# Patient Record
Sex: Female | Born: 1945 | Race: White | Hispanic: No | Marital: Married | Smoking: Never smoker
Health system: Southern US, Community
[De-identification: ages and names within clinical notes are randomized; demographics above are authoritative.]

## PROBLEM LIST (undated history)

## (undated) DIAGNOSIS — K5790 Diverticulosis of intestine, part unspecified, without perforation or abscess without bleeding: Secondary | ICD-10-CM

## (undated) DIAGNOSIS — K589 Irritable bowel syndrome without diarrhea: Secondary | ICD-10-CM

## (undated) DIAGNOSIS — H269 Unspecified cataract: Secondary | ICD-10-CM

## (undated) DIAGNOSIS — N8110 Cystocele, unspecified: Secondary | ICD-10-CM

## (undated) DIAGNOSIS — Z860100 Personal history of colon polyps, unspecified: Secondary | ICD-10-CM

## (undated) DIAGNOSIS — K635 Polyp of colon: Secondary | ICD-10-CM

## (undated) DIAGNOSIS — E039 Hypothyroidism, unspecified: Secondary | ICD-10-CM

## (undated) DIAGNOSIS — K449 Diaphragmatic hernia without obstruction or gangrene: Secondary | ICD-10-CM

## (undated) DIAGNOSIS — K219 Gastro-esophageal reflux disease without esophagitis: Secondary | ICD-10-CM

## (undated) DIAGNOSIS — Z853 Personal history of malignant neoplasm of breast: Secondary | ICD-10-CM

## (undated) DIAGNOSIS — R002 Palpitations: Secondary | ICD-10-CM

## (undated) DIAGNOSIS — E785 Hyperlipidemia, unspecified: Secondary | ICD-10-CM

## (undated) DIAGNOSIS — R7303 Prediabetes: Secondary | ICD-10-CM

## (undated) DIAGNOSIS — M810 Age-related osteoporosis without current pathological fracture: Secondary | ICD-10-CM

## (undated) DIAGNOSIS — R55 Syncope and collapse: Secondary | ICD-10-CM

## (undated) HISTORY — PX: NASAL SINUS SURGERY: SHX719

## (undated) HISTORY — DX: Personal history of colonic polyps: Z86.010

## (undated) HISTORY — PX: BUNIONECTOMY: SHX129

## (undated) HISTORY — PX: CARPAL TUNNEL RELEASE: SHX101

## (undated) HISTORY — DX: Irritable bowel syndrome, unspecified: K58.9

## (undated) HISTORY — DX: Personal history of malignant neoplasm of breast: Z85.3

## (undated) HISTORY — DX: Unspecified cataract: H26.9

## (undated) HISTORY — DX: Hypothyroidism, unspecified: E03.9

## (undated) HISTORY — DX: Prediabetes: R73.03

## (undated) HISTORY — DX: Diaphragmatic hernia without obstruction or gangrene: K44.9

## (undated) HISTORY — PX: TONSILLECTOMY: SUR1361

## (undated) HISTORY — DX: Hyperlipidemia, unspecified: E78.5

## (undated) HISTORY — DX: Age-related osteoporosis without current pathological fracture: M81.0

## (undated) HISTORY — DX: Gastro-esophageal reflux disease without esophagitis: K21.9

## (undated) HISTORY — DX: Polyp of colon: K63.5

## (undated) HISTORY — DX: Cystocele, unspecified: N81.10

## (undated) HISTORY — DX: Personal history of colon polyps, unspecified: Z86.0100

## (undated) HISTORY — DX: Diverticulosis of intestine, part unspecified, without perforation or abscess without bleeding: K57.90

## (undated) HISTORY — DX: Palpitations: R00.2

---
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
## 1997-11-13 ENCOUNTER — Ambulatory Visit (HOSPITAL_COMMUNITY): Admission: RE | Admit: 1997-11-13 | Discharge: 1997-11-13 | Payer: Self-pay | Admitting: Orthopedic Surgery

## 1997-11-27 ENCOUNTER — Ambulatory Visit (HOSPITAL_COMMUNITY): Admission: RE | Admit: 1997-11-27 | Discharge: 1997-11-27 | Payer: Self-pay | Admitting: Orthopedic Surgery

## 1997-11-27 ENCOUNTER — Encounter: Payer: Self-pay | Admitting: Orthopedic Surgery

## 1997-12-11 ENCOUNTER — Encounter: Payer: Self-pay | Admitting: Orthopedic Surgery

## 1997-12-11 ENCOUNTER — Ambulatory Visit (HOSPITAL_COMMUNITY): Admission: RE | Admit: 1997-12-11 | Discharge: 1997-12-11 | Payer: Self-pay | Admitting: Orthopedic Surgery

## 1998-03-28 ENCOUNTER — Other Ambulatory Visit: Admission: RE | Admit: 1998-03-28 | Discharge: 1998-03-28 | Payer: Self-pay | Admitting: Obstetrics and Gynecology

## 1998-06-25 ENCOUNTER — Other Ambulatory Visit: Admission: RE | Admit: 1998-06-25 | Discharge: 1998-06-25 | Payer: Self-pay | Admitting: Internal Medicine

## 1998-06-25 ENCOUNTER — Encounter: Payer: Self-pay | Admitting: Internal Medicine

## 1998-06-30 ENCOUNTER — Encounter: Payer: Self-pay | Admitting: Internal Medicine

## 1998-06-30 ENCOUNTER — Ambulatory Visit (HOSPITAL_COMMUNITY): Admission: RE | Admit: 1998-06-30 | Discharge: 1998-06-30 | Payer: Self-pay | Admitting: Internal Medicine

## 1999-07-01 ENCOUNTER — Other Ambulatory Visit: Admission: RE | Admit: 1999-07-01 | Discharge: 1999-07-01 | Payer: Self-pay | Admitting: Obstetrics and Gynecology

## 2001-07-03 ENCOUNTER — Other Ambulatory Visit: Admission: RE | Admit: 2001-07-03 | Discharge: 2001-07-03 | Payer: Self-pay | Admitting: Obstetrics and Gynecology

## 2002-07-19 ENCOUNTER — Encounter: Payer: Self-pay | Admitting: Internal Medicine

## 2002-09-27 ENCOUNTER — Other Ambulatory Visit: Admission: RE | Admit: 2002-09-27 | Discharge: 2002-09-27 | Payer: Self-pay | Admitting: Obstetrics and Gynecology

## 2002-10-04 ENCOUNTER — Encounter: Payer: Self-pay | Admitting: Obstetrics and Gynecology

## 2002-10-04 ENCOUNTER — Ambulatory Visit (HOSPITAL_COMMUNITY): Admission: RE | Admit: 2002-10-04 | Discharge: 2002-10-04 | Payer: Self-pay | Admitting: Obstetrics and Gynecology

## 2002-11-01 ENCOUNTER — Ambulatory Visit (HOSPITAL_COMMUNITY): Admission: RE | Admit: 2002-11-01 | Discharge: 2002-11-01 | Payer: Self-pay | Admitting: Internal Medicine

## 2002-11-01 ENCOUNTER — Encounter: Payer: Self-pay | Admitting: Internal Medicine

## 2003-03-31 ENCOUNTER — Emergency Department (HOSPITAL_COMMUNITY): Admission: EM | Admit: 2003-03-31 | Discharge: 2003-03-31 | Payer: Self-pay | Admitting: Emergency Medicine

## 2003-07-06 ENCOUNTER — Ambulatory Visit (HOSPITAL_COMMUNITY): Admission: RE | Admit: 2003-07-06 | Discharge: 2003-07-06 | Payer: Self-pay | Admitting: Internal Medicine

## 2003-11-12 ENCOUNTER — Other Ambulatory Visit: Admission: RE | Admit: 2003-11-12 | Discharge: 2003-11-12 | Payer: Self-pay | Admitting: Obstetrics and Gynecology

## 2004-06-24 ENCOUNTER — Ambulatory Visit: Payer: Self-pay | Admitting: Internal Medicine

## 2004-07-22 ENCOUNTER — Ambulatory Visit: Payer: Self-pay | Admitting: Gastroenterology

## 2004-07-23 ENCOUNTER — Ambulatory Visit: Payer: Self-pay | Admitting: Internal Medicine

## 2004-08-20 ENCOUNTER — Encounter: Admission: RE | Admit: 2004-08-20 | Discharge: 2004-08-20 | Payer: Self-pay | Admitting: Orthopedic Surgery

## 2004-08-28 ENCOUNTER — Ambulatory Visit (HOSPITAL_COMMUNITY): Admission: RE | Admit: 2004-08-28 | Discharge: 2004-08-28 | Payer: Self-pay | Admitting: Obstetrics and Gynecology

## 2004-10-21 ENCOUNTER — Ambulatory Visit: Payer: Self-pay | Admitting: Internal Medicine

## 2004-10-23 ENCOUNTER — Ambulatory Visit: Payer: Self-pay | Admitting: Internal Medicine

## 2004-12-11 ENCOUNTER — Ambulatory Visit: Payer: Self-pay

## 2005-02-15 ENCOUNTER — Ambulatory Visit: Payer: Self-pay | Admitting: Internal Medicine

## 2005-05-04 ENCOUNTER — Other Ambulatory Visit: Admission: RE | Admit: 2005-05-04 | Discharge: 2005-05-04 | Payer: Self-pay | Admitting: Obstetrics and Gynecology

## 2005-07-20 ENCOUNTER — Ambulatory Visit (HOSPITAL_COMMUNITY): Admission: RE | Admit: 2005-07-20 | Discharge: 2005-07-20 | Payer: Self-pay | Admitting: Internal Medicine

## 2006-10-24 ENCOUNTER — Ambulatory Visit: Payer: Self-pay | Admitting: Internal Medicine

## 2006-11-14 ENCOUNTER — Ambulatory Visit: Payer: Self-pay | Admitting: Internal Medicine

## 2006-11-14 LAB — CONVERTED CEMR LAB
ALT: 34 units/L (ref 0–35)
Albumin: 3.9 g/dL (ref 3.5–5.2)
Alkaline Phosphatase: 107 units/L (ref 39–117)
Basophils Absolute: 0 10*3/uL (ref 0.0–0.1)
Basophils Relative: 0.1 % (ref 0.0–1.0)
Bilirubin Urine: NEGATIVE
Bilirubin, Direct: 0.1 mg/dL (ref 0.0–0.3)
CRP, High Sensitivity: <1 — ABNORMAL LOW (ref 0.00–5.00)
Creatinine, Ser: 0.6 mg/dL (ref 0.4–1.2)
Crystals: NEGATIVE
Eosinophils Absolute: 0.1 10*3/uL (ref 0.0–0.6)
GFR calc Af Amer: 132 mL/min
Leukocytes, UA: NEGATIVE
Lipase: 21 units/L (ref 11.0–59.0)
Lymphocytes Relative: 19.1 % (ref 12.0–46.0)
MCHC: 34.2 g/dL (ref 30.0–36.0)
MCV: 88.5 fL (ref 78.0–100.0)
Monocytes Relative: 8.4 % (ref 3.0–11.0)
Mucus, UA: NEGATIVE
RDW: 12.8 % (ref 11.5–14.6)
Total Protein, Urine: NEGATIVE mg/dL
Total Protein: 6.8 g/dL (ref 6.0–8.3)
Urine Glucose: NEGATIVE mg/dL
pH: 6.5 (ref 5.0–8.0)

## 2006-11-16 ENCOUNTER — Ambulatory Visit (HOSPITAL_COMMUNITY): Admission: RE | Admit: 2006-11-16 | Discharge: 2006-11-16 | Payer: Self-pay | Admitting: Internal Medicine

## 2007-12-01 ENCOUNTER — Ambulatory Visit: Payer: Self-pay | Admitting: Internal Medicine

## 2007-12-14 ENCOUNTER — Ambulatory Visit: Payer: Self-pay | Admitting: Internal Medicine

## 2007-12-14 ENCOUNTER — Encounter: Payer: Self-pay | Admitting: Internal Medicine

## 2007-12-15 ENCOUNTER — Encounter: Payer: Self-pay | Admitting: Internal Medicine

## 2008-05-07 ENCOUNTER — Encounter: Payer: Self-pay | Admitting: Cardiology

## 2008-05-07 ENCOUNTER — Ambulatory Visit: Payer: Self-pay | Admitting: Cardiology

## 2008-05-07 DIAGNOSIS — E785 Hyperlipidemia, unspecified: Secondary | ICD-10-CM | POA: Insufficient documentation

## 2008-05-07 LAB — CONVERTED CEMR LAB
Direct LDL: 194.9 mg/dL
HDL: 58.7 mg/dL (ref >39.0)
VLDL: 16 mg/dL (ref 0–40)

## 2008-05-15 ENCOUNTER — Ambulatory Visit: Payer: Self-pay

## 2008-05-15 ENCOUNTER — Encounter: Payer: Self-pay | Admitting: Cardiology

## 2009-03-27 ENCOUNTER — Telehealth: Payer: Self-pay | Admitting: Internal Medicine

## 2009-03-31 ENCOUNTER — Ambulatory Visit: Payer: Self-pay | Admitting: Gastroenterology

## 2009-03-31 DIAGNOSIS — E039 Hypothyroidism, unspecified: Secondary | ICD-10-CM | POA: Insufficient documentation

## 2009-03-31 DIAGNOSIS — Z86010 Personal history of colonic polyps: Secondary | ICD-10-CM | POA: Insufficient documentation

## 2009-03-31 DIAGNOSIS — Z853 Personal history of malignant neoplasm of breast: Secondary | ICD-10-CM | POA: Insufficient documentation

## 2009-03-31 DIAGNOSIS — K5730 Diverticulosis of large intestine without perforation or abscess without bleeding: Secondary | ICD-10-CM | POA: Insufficient documentation

## 2009-04-01 LAB — CONVERTED CEMR LAB
Eosinophils Absolute: 0.1 10*3/uL (ref 0.0–0.7)
Eosinophils Relative: 1 % (ref 0.0–5.0)
HCT: 39.8 % (ref 36.0–46.0)
Lymphocytes Relative: 23.9 % (ref 12.0–46.0)
Monocytes Absolute: 0.9 10*3/uL (ref 0.1–1.0)
Monocytes Relative: 9.5 % (ref 3.0–12.0)
Neutrophils Relative %: 64.6 % (ref 43.0–77.0)
Platelets: 397 10*3/uL (ref 150.0–400.0)
RBC: 4.37 M/uL (ref 3.87–5.11)
RDW: 12.7 % (ref 11.5–14.6)
WBC: 9 10*3/uL (ref 4.5–10.5)

## 2010-04-21 NOTE — Assessment & Plan Note (Signed)
Summary: Blood on stool/rectal pain   History of Present Illness Visit Type: follow up  Primary GI MD: Yancey Flemings MD Primary Provider: Dr. Simone Curia Requesting Provider: n/a Chief Complaint: BRB in stool and rectal pain  History of Present Illness:   65  YO FEMALE  KNOWN TO DR Marina Goodell WITH HX OF DIVERTICULOSIS AND ADENOMATOUS POLYPS. SHE HAD AN EGD IN 2006-NORMAL. LAST COLONOSCOPY 11/2007 WITH SEVERAL SMALL POLYPS/ADENOMATOUS AND INTERNAL HEMORRHOIDS. SHE COMES IN TODAY WITH C/O INTERMITTENT RECTAL BLEEDING WITH SMALL AMTS OF BRB WITH  BM OVER THE PAST 3 MONTHS. SHE HAS HAD SOME INTERNAL RECTAL DISCOMFORT ,MORE LEFT SIDED. SHE SAYS SHE STANDS OR WALKS AT WORK FOR ABOUT 10 HOURS/DAY, AND GETS A PULLING SENSATION IN BLADDER/UTERUS AREA. SHE HAS ALSO NOTED MORE DIFFICULTY EVACUATING BOWELS RECENTLY. SHE WAS SEEN BY UROLOGY AND PER PT NOT FELT TO HAVE ANY SIGNIFICANT BLADDER PROLAPSE.   GI Review of Systems      Denies abdominal pain, acid reflux, belching, bloating, chest pain, dysphagia with liquids, dysphagia with solids, heartburn, loss of appetite, nausea, vomiting, and  vomiting blood.      Reports diverticulosis, rectal bleeding, and  rectal pain.     Denies anal fissure, black tarry stools, change in bowel habit, fecal incontinence, heme positive stool, hemorrhoids, irritable bowel syndrome, jaundice, light color stool, and  liver problems.   Current Medications (verified): 1)  Synthroid 75 Mcg Tabs (Levothyroxine Sodium) .... Take One Tab Daily 2)  Vitamin D 1000 Unit Tabs (Cholecalciferol) .... As Directed 3)  Multiple Vitamins  Tabs (Multiple Vitamin) .... Take Tab As Diredted 4)  Nasonex 50 Mcg/act Susp (Mometasone Furoate) .... As Directed 5)  Afrin Nasal Spray 0.05 % Soln (Oxymetazoline Hcl) .... As Directed 6)  Fish Oil 1000 Mg Caps (Omega-3 Fatty Acids) .... As Directed 7)  Calcium 600 1500 Mg Tabs (Calcium Carbonate) .... One Tablet By Mouth Once Daily  Allergies  (verified): 1)  * Shellfish  Past History:  Past Medical History: 1.  Hyperlipidemia:  she is now taking fish oil.  She had myalgias with Vytorin and Lipitor.  2.  Hypothyroidism. 3.  Diverticulosis.  4.  Anxiety.  5.  IBS 6.  Colonic polyps./ADENOMATOUS-LAST COLON 11/2007  7.  GERD  Past Surgical History: Reviewed history from 05/07/2008 and no changes required.   Diagnostic laparoscopy. 08/28/2004  Family History: Father with h/o 2 heart valve replacements.  He also had a stroke.  Brother with MI at age 60.   Family History of Colon Cancer:Great Uncle on Paternal Side   Social History: Reviewed history from 05/07/2008 and no changes required. Lives with husband in [place].  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  No ETOH.    Review of Systems  The patient denies allergy/sinus, anemia, anxiety-new, arthritis/joint pain, back pain, blood in urine, breast changes/lumps, change in vision, confusion, cough, coughing up blood, depression-new, fainting, fatigue, fever, headaches-new, hearing problems, heart murmur, heart rhythm changes, itching, menstrual pain, muscle pains/cramps, night sweats, nosebleeds, pregnancy symptoms, shortness of breath, skin rash, sleeping problems, sore throat, swelling of feet/legs, swollen lymph glands, thirst - excessive , urination - excessive , urination changes/pain, urine leakage, vision changes, and voice change.    Vital Signs:  Patient profile:   65 year old female Height:      61 inches Weight:      119 pounds BMI:     22.57 BSA:     1.52 Pulse rate:   72 / minute  Pulse rhythm:   regular BP sitting:   118 / 64  (left arm) Cuff size:   regular  Vitals Entered By: Ok Anis CMA (March 31, 2009 1:30 PM)  Physical Exam  General:  Well developed, well nourished, no acute distress. Head:  Normocephalic and atraumatic. Eyes:  PERRLA, no icterus. Lungs:  Clear throughout to auscultation. Heart:  Regular rate and rhythm; no murmurs,  rubs,  or bruits. Abdomen:  SOFT, NONTENDER, NO MASS OR HSM,BS+ Rectal:  NO EXTERNAL LESION,NO INTERNAL LESION APPRECIATED,NONTENDER EXAM,NO STOOL IN VAULT-MUCUS HEME NEGATIVE.THERE IS FULLNESS PROLAPSE OF VAGINA IN TO RECTUM WITH VALSALVA Extremities:  No clubbing, cyanosis, edema or deformities noted. Neurologic:  Alert and  oriented x4;  grossly normal neurologically. Psych:  Alert and cooperative. Normal mood and affect.   Impression & Recommendations:  Problem # 1:  RECTAL BLEEDING (ICD-569.3) Assessment New 65 YO FEMALE WITH INTERMITTENT SMALL VOLUME HEMATOCHEZIA,LIKELY SECONDARY TO INTERNAL HEMORRHOIDS. SHE ALSO HAS SXS OF PELVIC FLOOR DYSFUNCTION WITH SENSATION OF INCOMPLETE BOWEL EVACUATION,AND PRESSURE /PULLING WITH PROLONGED STANDING. CANNOT R/O RECTOCELE. START BENEFIBER DAILY ANALPRAM CREAM 2.5% 2-3 X DAILY X 2 WEEKS THEN AS NEEDED PT WILL MAKE A FOLLOW UP APPT WITH HER GYNECOLOGIST FOR FURTHER EVALUATION FOLLOW UP WITH DR Marina Goodell IN 3 WEEKS IF SXS PERSIST(RECTAL  BLEEDING). Orders: TLB-CBC Platelet - w/Differential ([id number])  Problem # 2:  PERSONAL HX COLONIC POLYPS (ICD-V12.72) Assessment: Comment Only ADENOMATOUS-LAST COLON 9/09 ;DUE FOR FOLLOW UP 1014  Problem # 3:  HYPOTHYROIDISM (ICD-244.9) Assessment: Comment Only  Problem # 4:  HYPERLIPIDEMIA (ICD-272.4) Assessment: Comment Only  Patient Instructions: 1)  Please go to the lab, basement level. 2)  Use Benefiber, daily. 3)  We have given you a crad with appt to see Dr. Marina Goodell 04-14-09.  4)  Copy sent to : Dr. Simone Curia 5)  The medication list was reviewed and reconciled.  All changed / newly prescribed medications were explained.  A complete medication list was provided to the patient / caregiver.  Prescriptions: ANALPRAM-HC 1-2.5 % CREA (HYDROCORTISONE ACE-PRAMOXINE) Use the rectal cream twice daily  #30 cc x 0   Entered by:   Lowry Ram NCMA   Authorized by:   Sammuel Cooper PA-c   Signed by:   Lowry Ram NCMA on 03/31/2009   Method used:   Electronically to        [facility]  [address] [phone]* (retail)       [address]       [place], [place]  [address]       Ph: [phone] or [phone]       Fax: [phone]   RxID:   [id number]

## 2010-04-21 NOTE — Progress Notes (Signed)
Summary: triage   Phone Note Call from Patient Call back at [phone]   Caller: Patient Call For: Marina Goodell Reason for Call: Talk to Nurse Summary of Call: Patient states that she has blood in the stools and feels like is raw in her rectum wants to be seen sooner than appt date 1-24 ( ok to speak to husband Thayer Ohm) Initial call taken by: Tawni Levy,  March 27, 2009 3:41 PM  Follow-up for Phone Call        Husband contacted and pt. has had symptoms for about a month.Gyne visit negative and  test for Uti. negative. Sees blood on outside of stool. Offered appt. with PA as Dr.Perry is hospital physician  next week.He will contact wife and call back.Pt called andgiven appt. with PA Follow-up by: Teryl Lucy RN,  March 27, 2009 4:06 PM

## 2010-04-23 ENCOUNTER — Encounter: Payer: Self-pay | Admitting: Cardiology

## 2010-04-27 ENCOUNTER — Inpatient Hospital Stay (HOSPITAL_COMMUNITY): Payer: PRIVATE HEALTH INSURANCE

## 2010-04-27 ENCOUNTER — Telehealth: Payer: Self-pay | Admitting: Cardiology

## 2010-04-27 ENCOUNTER — Encounter: Payer: Self-pay | Admitting: Physician Assistant

## 2010-04-27 ENCOUNTER — Ambulatory Visit (HOSPITAL_COMMUNITY)
Admission: AD | Admit: 2010-04-27 | Discharge: 2010-04-28 | Disposition: A | Discharge status: Home / Self Care | Payer: PRIVATE HEALTH INSURANCE | Source: Ambulatory Visit | Attending: Cardiology | Admitting: Cardiology

## 2010-04-27 ENCOUNTER — Encounter (HOSPITAL_COMMUNITY): Payer: Self-pay | Admitting: Radiology

## 2010-04-27 ENCOUNTER — Ambulatory Visit (INDEPENDENT_AMBULATORY_CARE_PROVIDER_SITE_OTHER): Payer: PRIVATE HEALTH INSURANCE | Admitting: Physician Assistant

## 2010-04-27 DIAGNOSIS — E039 Hypothyroidism, unspecified: Secondary | ICD-10-CM | POA: Insufficient documentation

## 2010-04-27 DIAGNOSIS — R079 Chest pain, unspecified: Secondary | ICD-10-CM | POA: Insufficient documentation

## 2010-04-27 DIAGNOSIS — Z01812 Encounter for preprocedural laboratory examination: Secondary | ICD-10-CM | POA: Insufficient documentation

## 2010-04-27 DIAGNOSIS — Z860100 Personal history of colon polyps, unspecified: Secondary | ICD-10-CM | POA: Insufficient documentation

## 2010-04-27 DIAGNOSIS — I10 Essential (primary) hypertension: Secondary | ICD-10-CM

## 2010-04-27 DIAGNOSIS — K589 Irritable bowel syndrome without diarrhea: Secondary | ICD-10-CM | POA: Insufficient documentation

## 2010-04-27 DIAGNOSIS — R55 Syncope and collapse: Secondary | ICD-10-CM

## 2010-04-27 DIAGNOSIS — E785 Hyperlipidemia, unspecified: Secondary | ICD-10-CM | POA: Insufficient documentation

## 2010-04-27 DIAGNOSIS — F411 Generalized anxiety disorder: Secondary | ICD-10-CM | POA: Insufficient documentation

## 2010-04-27 DIAGNOSIS — K5730 Diverticulosis of large intestine without perforation or abscess without bleeding: Secondary | ICD-10-CM | POA: Insufficient documentation

## 2010-04-27 DIAGNOSIS — K219 Gastro-esophageal reflux disease without esophagitis: Secondary | ICD-10-CM | POA: Insufficient documentation

## 2010-04-27 HISTORY — DX: Syncope and collapse: R55

## 2010-04-27 LAB — COMPREHENSIVE METABOLIC PANEL
ALT: 17 U/L (ref 0–35)
AST: 21 U/L (ref 0–37)
Albumin: 4.2 g/dL (ref 3.5–5.2)
Alkaline Phosphatase: 120 U/L — ABNORMAL HIGH (ref 39–117)
BUN: 12 mg/dL (ref 6–23)
CO2: 25 mEq/L (ref 19–32)
Calcium: 9.8 mg/dL (ref 8.4–10.5)
Chloride: 98 mEq/L (ref 96–112)
GFR calc Af Amer: >60 mL/min (ref >60)
GFR calc non Af Amer: >60 mL/min (ref >60)
Glucose, Bld: 159 mg/dL — ABNORMAL HIGH (ref 70–99)
Sodium: 135 mEq/L (ref 135–145)

## 2010-04-27 LAB — CBC
HCT: 41.6 % (ref 36.0–46.0)
Hemoglobin: 14.5 g/dL (ref 12.0–15.0)
RBC: 4.83 MIL/uL (ref 3.87–5.11)
RDW: 13.2 % (ref 11.5–15.5)

## 2010-04-27 LAB — PROTIME-INR
INR: 1 (ref 0.00–1.49)
Prothrombin Time: 13.4 seconds (ref 11.6–15.2)

## 2010-04-27 LAB — CARDIAC PANEL(CRET KIN+CKTOT+MB+TROPI): Troponin I: <0.01 ng/mL (ref 0.00–0.06)

## 2010-04-27 MED ORDER — IOHEXOL 300 MG/ML  SOLN: 100.0000 mL | Freq: Once | INTRAMUSCULAR | Status: AC | PRN

## 2010-04-28 LAB — BASIC METABOLIC PANEL
BUN: 13 mg/dL (ref 6–23)
Calcium: 9.1 mg/dL (ref 8.4–10.5)
Creatinine, Ser: 0.62 mg/dL (ref 0.4–1.2)

## 2010-04-28 LAB — CARDIAC PANEL(CRET KIN+CKTOT+MB+TROPI)
CK, MB: 1.1 ng/mL (ref 0.3–4.0)
CK, MB: 0.9 ng/mL (ref 0.3–4.0)
Relative Index: INVALID (ref 0.0–2.5)
Total CK: 41 U/L (ref 7–177)
Troponin I: <0.01 ng/mL (ref 0.00–0.06)

## 2010-04-29 ENCOUNTER — Encounter (INDEPENDENT_AMBULATORY_CARE_PROVIDER_SITE_OTHER): Payer: PRIVATE HEALTH INSURANCE

## 2010-04-29 DIAGNOSIS — R55 Syncope and collapse: Secondary | ICD-10-CM

## 2010-05-04 ENCOUNTER — Encounter: Payer: Self-pay | Admitting: Cardiology

## 2010-05-04 ENCOUNTER — Ambulatory Visit (INDEPENDENT_AMBULATORY_CARE_PROVIDER_SITE_OTHER): Payer: PRIVATE HEALTH INSURANCE | Admitting: Cardiology

## 2010-05-04 DIAGNOSIS — E039 Hypothyroidism, unspecified: Secondary | ICD-10-CM

## 2010-05-04 DIAGNOSIS — R000 Tachycardia, unspecified: Secondary | ICD-10-CM

## 2010-05-04 DIAGNOSIS — R079 Chest pain, unspecified: Secondary | ICD-10-CM

## 2010-05-06 ENCOUNTER — Ambulatory Visit: Payer: Self-pay | Admitting: Cardiology

## 2010-05-06 ENCOUNTER — Telehealth: Payer: Self-pay | Admitting: Cardiology

## 2010-05-06 NOTE — Procedures (Signed)
  NAME:  Andrea Bates, Andrea Bates NO.:  [id number]  MEDICAL RECORD NO.:  [id number]           PATIENT TYPE:  I  LOCATION:  6522                         FACILITY:  [facility]  PHYSICIAN:  Peter M. Swaziland, M.D.  DATE OF BIRTH:  05-Aug-1945  DATE OF PROCEDURE:  04/28/2010 DATE OF DISCHARGE:  04/28/2010                           CARDIAC CATHETERIZATION   INDICATION FOR PROCEDURE:  A 65 year old white female with history of refractory chest pain.  Prior noninvasive evaluation has been unremarkable.  PROCEDURES: 1. Left heart catheterization. 2. Coronary and left ventricular angiography.  ACCESS:  Via the right radial artery using standard Seldinger technique.  EQUIPMENTS:  A 5-French 4-cm right Judkins catheter, 5-French 3.5-cm left Judkins catheter, 5-French pigtail catheter, 5-French arterial sheath.  MEDICATIONS: 1. Local anesthesia 1% Xylocaine. 2. Versed 2 mg IV. 3. Fentanyl 25 mcg IV. 4. Heparin 2500 units IV. 5. Verapamil 3 mg intraarterial.  CONTRAST:  80 mL of Omnipaque.  HEMODYNAMIC DATA:  Aortic pressure is 110/61 with a mean of 81 mmHg, left ventricle pressure is 115 with EDP of 8 mmHg.  ANGIOGRAPHIC DATA: 1. The right coronary artery arises and distributes normally.  It is a     dominant vessel.  It is normal. 2. The left anterior descending artery arises normally.  The left main     coronary artery is normal. 3. The left anterior descending artery is also normal. 4. The left circumflex coronary gives rise to a single obtuse marginal     vessel and then terminates in the AV groove.  It is normal.  Left ventricular angiography was performed in the RAO view.  This demonstrates normal left ventricular size and contractility with normal systolic function.  Ejection fraction is estimated at 65%.  There is no mitral regurgitation or prolapse.  FINAL INTERPRETATION: 1. Normal coronary anatomy. 2. Normal left ventricular function.     ______________________________ Peter M. Swaziland, M.D.     PMJ/MEDQ  D:  04/28/2010  T:  04/28/2010  Job:  [id number]  Electronically Signed by PETER Swaziland M.D. on 05/06/2010 11:16:20 AM

## 2010-05-07 LAB — CONVERTED CEMR LAB: TSH: 0.457 microintl units/mL (ref 0.350–4.500)

## 2010-05-07 NOTE — Assessment & Plan Note (Signed)
Summary: Add on B/P issues/nm  Medications Added VITAMIN D 1000 UNIT TABS (CHOLECALCIFEROL) 06-4998 units daily      Allergies Added:   Visit Type:  Follow-up Referring Provider:  n/a Primary Provider:  Dr. Simone Curia  CC:  HR fast - BP up and down.  History of Present Illness: Primary Cardiologist:  Dr. [name]  Andrea Bates is a 65 yo female with h/o hyperlipidemia and family history of early CAD who saw Dr. Shirlee Latch in Feb 2010 for chest pain and dyspnea.  She was set up for an echo that demonstrated  an EF of 55-60%.  She was to be set up for a myoview and this demonstrated no ischemia or scar..  She called in today with BP problems and was added on to my schedule.  She states that about 6 weeks ago she noted her blood pressure was high.  Over the last one to 2 weeks,  she's had chest tightness/pressure.  She notes it with exertion.  She has associated shortness of breath.  She denies associated arm or jaw discomfort, nausea or diaphoresis.  She denies associated syncope.  She saw her primary care physician recently and her blood pressure is 140/100.  She was placed on lisinopril 10 mg a day.  She took a half a tablet and her blood pressure dropped into the 90s and she felt weak with this.  This was changed to atenolol 25 mg a day.  The night when she took this medication, she awoke with chest discomfort and palpitations.  Shortly after awakening, she passed out. She went to [facility].  The records indicate that she had an echocardiogram with an ejection fraction of 60-65%.  Carotid Dopplers demonstrated plaque but no stenosis.  Stress Cardiolite was negative for ischemia.  She apparently walked for 8 minutes.  Her LDL was 161.  Her TSH was 0.41 but her T4 and T3 were both normal.  24-hour urine collection was incomplete for rule out a pheochromocytoma.  She continues to have episodes of awakening in the middle of the night with chest tightness.  She has rapid palpitations  associated with this.   Current Medications (verified): 1)  Synthroid 75 Mcg Tabs (Levothyroxine Sodium) .... Take One Tab Daily 2)  Vitamin D 1000 Unit Tabs (Cholecalciferol) .... 06-4998 Units Daily 3)  Nasonex 50 Mcg/act Susp (Mometasone Furoate) .... As Directed 4)  Fish Oil 1000 Mg Caps (Omega-3 Fatty Acids) .... As Directed 5)  Calcium 600 1500 Mg Tabs (Calcium Carbonate) .... One Tablet By Mouth Once Daily  Allergies (verified): 1)  * Shellfish  Past History:  Past Medical History: Reviewed history from 03/31/2009 and no changes required. 1.  Hyperlipidemia:  she is now taking fish oil.  She had myalgias with Vytorin and Lipitor.  2.  Hypothyroidism. 3.  Diverticulosis.  4.  Anxiety.  5.  IBS 6.  Colonic polyps./ADENOMATOUS-LAST COLON 11/2007  7.  GERD  Past Surgical History: Reviewed history from 05/07/2008 and no changes required.   Diagnostic laparoscopy. 08/28/2004  Family History: Reviewed history from 03/31/2009 and no changes required. Father with h/o 2 heart valve replacements.  He also had a stroke.  Brother with MI at age 35.   Family History of Colon Cancer:Great Uncle on Paternal Side   Social History: Reviewed history from 05/07/2008 and no changes required. Lives with husband in [place].  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  No ETOH.    Review of Systems  She has lost about 7 pounds over the last week.  She is anxious about her symptoms.  Otherwise, as per  the HPI.  All other systems reviewed and negative.   Vital Signs:  Patient profile:   65 year old female Height:      61 inches Weight:      113 pounds BMI:     21.43 Pulse rate:   82 / minute Pulse (ortho):   87 / minute BP sitting:   145 / 88  (left arm) BP standing:   138 / 83 Cuff size:   regular  Vitals Entered By: Hardin Negus, RMA (April 27, 2010 3:24 PM)  Serial Vital Signs/Assessments:  Time      Position  BP       Pulse  Resp  Temp     By           Lying RA   126/82   [address], RMA           Sitting   128/87   [address] [place]           Standing  138/83   87                    Hardin Negus, RMA  Comments: 2 mins   BP  138/90  P 90 4 mins   BP  127/90  P 101 'Swimmy' headed when she stood up then better at 2 min mark By: Hardin Negus, RMA    Physical Exam  General:  Well nourished, well developed, in no acute distress HEENT: normal Neck: no JVD Cardiac:  normal S1, S2; RRR; no murmur Lungs:  clear to auscultation bilaterally, no wheezing, rhonchi or rales Abd: soft, nontender, no hepatomegaly Ext: no edema Vascular: no carotid  bruits Skin: warm and dry Neuro:  CNs 2-12 intact, no focal abnormalities noted    EKG  Procedure date:  04/27/2010  Findings:      normal sinus rhythm Heart rate 82 Normal axis No ischemic changes  Impression & Recommendations:  Problem # 1:  CHEST PAIN UNSPECIFIED (ICD-786.50)  Etiology unclear.  Her recent workup included an echocardiogram and stress Cardiolite.  These were both unremarkable.  However, she continues to have symptoms of chest tightness.  This can occur with exertion or awaken her from sleep.  I discussed her case today with Dr. Jens Som.  We plan to admit her to the hospital for further evaluation.  She will undergo cardiac catheterization tomorrow to further define her symptoms.  Risks and benefits have been discussed with the patient and available family members.  Risks include but are not limited to death, heart attack, stroke, bleeding, infection or allergic reaction from the dye.  The patient is willing to accept these risks and proceed with catheterization.   She has a questionable allergic reaction to shellfish.  I will go ahead and initiate pretreatment to cover for this.  We will also check a d-dimer.  If this is abnormal, she should undergo a chest CT to rule out pulmonary embolism.  I do not see that this was done during  recent admission to [facility].  Should her cardiac catheterization be unremarkable, we may need to consider placing a  21 day event monitor on her to rule out significant arrhythmias contributing to her symptoms.  Orders: EKG w/ Interpretation (93000)  Problem # 2:  HYPOTHYROIDISM (ICD-244.9) We can repeat her TSH while she is hospitalized. Her updated medication list for this problem includes:    Synthroid 75 Mcg Tabs (Levothyroxine sodium) .Marland Kitchen... Take one tab daily  Problem # 3:  HYPERLIPIDEMIA (ICD-272.4) She has been intolerant of statins in the past.  Problem # 4:  SYNCOPE (ICD-780.2) As noted above, she had a recent episode of this in the context of taking blood pressure medications which resulted in hypotension.  Her heart rate does increase when she stands up today.  She likely is somewhat volume depleted.  We will make sure she receives plenty of IV fluids while hospitalized.

## 2010-05-07 NOTE — Progress Notes (Signed)
Summary: pt having chest pressure/lg   Phone Note Call from Patient   Caller: Spouse Reason for Call: Talk to Nurse, Talk to Doctor Summary of Call: pt heart rate is so low she almost passes out and spouse wants her seen today he is very concerned pt didn't get any rest last night her b/p was 81/60 hr 122. pt is having chest pressure and SOB off and on she is tired and has lost 5-7 pounds in a weeks time. Initial call taken by: Omer Jack,  April 27, 2010 8:09 AM  Follow-up for Phone Call         Pt's husband called concern. According to spause, pt. has been having problems with her B/P. Last monday pt's B/P was 144/100. She was seen by her PCP which prescrived Lisinopril 10 mg once a day. Pt. took 1/2 tablet instead of one tablet. About 1:00 AM the next day, her B/P was 83/50 pulse 122 beats/ min. Pt's PCP changed medication on Wednesday to Atenolol 25 mg once a day. Again pt. only took 1/2 tablet of the medication because pt. is very sensitive to medications. Later the next morning pt. past out EMS was called. Pt.s B/P could only be palpated her. Patient  was taken  to the  [facility] on thursday. She was D/C home the next day on  friday. Pt's B/P today at 5:AM was 83/62 pulse 107 beats/min. Now B/P 126/ 98 pulse 88 beats/Min. Pt would like to be seen today. Follow-up by: [name], RN, BSN,  April 27, 2010 8:52 AM  Additional Follow-up for Phone Call Additional follow up Details #1::        An appointment was made for pt. today with Tereso Newcomer PA at 3:00 Pm. Pt. is aware to bring he B/P cuff with her to O/V Additional Follow-up by: [name], RN, BSN,  April 27, 2010 9:46 AM

## 2010-05-12 ENCOUNTER — Encounter: Payer: Self-pay | Admitting: Cardiology

## 2010-05-12 ENCOUNTER — Telehealth (INDEPENDENT_AMBULATORY_CARE_PROVIDER_SITE_OTHER): Payer: Self-pay | Admitting: *Deleted

## 2010-05-12 ENCOUNTER — Ambulatory Visit (HOSPITAL_BASED_OUTPATIENT_CLINIC_OR_DEPARTMENT_OTHER): Payer: PRIVATE HEALTH INSURANCE | Attending: Cardiology

## 2010-05-12 DIAGNOSIS — G4710 Hypersomnia, unspecified: Secondary | ICD-10-CM | POA: Insufficient documentation

## 2010-05-12 DIAGNOSIS — G4730 Sleep apnea, unspecified: Secondary | ICD-10-CM | POA: Insufficient documentation

## 2010-05-13 NOTE — Miscellaneous (Signed)
Summary: Orders Update  Clinical Lists Changes  Orders: Added new Test order of T-T3 Uptake [phone]) - Signed Added new Test order of T-TSH [phone]) - Signed Added new Test order of T-T4, Free [phone]) - Signed

## 2010-05-13 NOTE — Assessment & Plan Note (Signed)
Summary: chest pressure/medcost/pt [phone] or [phone]/mt      Allergies Added:   Referring Provider:  n/a Primary Provider:  Dr. Simone Curia  CC:  follow up to cardiac cath.  Pt reports today dizziness.  She states that she never had BP problems until the last 2 months.  She has also noticed low bps in the morning.  Pt questions thyroid?.  History of Present Illness: Andrea Bates is a 65 yo female with h/o hyperlipidemia and family history of early CAD who is seen in hospital followup today.  She was admitted to [facility] in 2/12 with atypical chest pain.  She had had a negative myoview at [facility] in 2011 but continued to have chest pain. She underwent cardiac catheterization during her recent admission which showed normal coronaries and normal LV systolic function.    She continues to have atypical, nonexertional chest pain.  This is often associated with palpitations.  She feels like her heart beats faster than it used to (averaging 80s-90s now rather than 60s-70s).  She gets periodic lightheaded spells though all blood pressure readings from home are within normal limits (she checks her BP several times a day).  She is wearing an event monitor now.  So far, the event monitor has shown only NSR with rare PACs and PVCs.  She has activated the monitor when she feels lightheaded, but rhythm has been unremarkable.  Additionally, her husband says that she stops breathing at night on occasion.  She falls asleep easily.   ECG: NSR, normal  Labs (2/12): TSH, free T4, free T3 all normal.  K 3.9, creatinine 0.62.    Current Medications (verified): 1)  Synthroid 75 Mcg Tabs (Levothyroxine Sodium) .... Take One Tab Daily 2)  Vitamin D 1000 Unit Tabs (Cholecalciferol) .... 06-4998 Units Daily 3)  Nasonex 50 Mcg/act Susp (Mometasone Furoate) .... As Directed 4)  Fish Oil 1000 Mg Caps (Omega-3 Fatty Acids) .... As Directed 5)  Calcium 600 1500 Mg Tabs (Calcium Carbonate) .... One Tablet By  Mouth Once Daily  Allergies (verified): 1)  * Shellfish  Past History:  Past Medical History: 1.  Hyperlipidemia:  she is now taking fish oil.  She had myalgias with Vytorin and Lipitor.  2.  Hypothyroidism. 3.  Diverticulosis.  4.  Anxiety.  5.  IBS 6.  Colonic polyps./ADENOMATOUS-LAST COLON 11/2007  7.  GERD 8.  Chest pain: Myoview at [facility] in 2011 was normal.  LHC (2/12) showed normal coronaries, EF 65%. 9.  Echo (2/10): EF 55-60%, grade I diastolic dysfunction  Family History: Reviewed history from 03/31/2009 and no changes required. Father with h/o 2 heart valve replacements.  He also had a stroke.  Brother with MI at age 88.   Family History of Colon Cancer:Great Uncle on Paternal Side   Social History: Reviewed history from 05/07/2008 and no changes required. Lives with husband in [place].  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  No ETOH.    Review of Systems       All systems reviewed and negative except as per HPI.   Vital Signs:  Patient profile:   65 year old female Height:      61 inches Weight:      113 pounds BMI:     21.43 Pulse rate:   90 / minute Pulse rhythm:   regular BP sitting:   142 / 84  (left arm) Cuff size:   regular  Vitals Entered By: Judithe Modest CMA (May 04, 2010 3:49 PM)  Physical Exam  General:  Well developed, well nourished, in no acute distress. Neck:  Neck supple, no JVD. No masses, thyromegaly or abnormal cervical nodes. Lungs:  Clear bilaterally to auscultation and percussion. Heart:  Non-displaced PMI, chest non-tender; regular rate and rhythm, S1, S2 without murmurs, rubs or gallops. Carotid upstroke normal, no bruit. Pedals normal pulses. No edema, no varicosities. Abdomen:  Bowel sounds positive; abdomen soft and non-tender without masses, organomegaly, or hernias noted. No hepatosplenomegaly. Extremities:  No clubbing or cyanosis. Neurologic:  Alert and oriented x 3. Psych:  Normal  affect.   Impression & Recommendations:  Problem # 1:  PALPITATIONS Assessment Unchanged Patient gets palpitations and lightheaded spells (not necessarily at the same time).  So far, workup for this has been unremarkable.  Event monitor has shown only NSR.  She has checked her BP when she feels lightheaded and it has been normal.   Problem # 2:  APNEA (ICD-786.03) Per husband, she stops breathing at night.  Will arrange sleep study.   Problem # 3:  CHEST PAIN-UNSPECIFIED (ICD-786.50) Atypical, noncardiac.  Normal catheterization earlier this month.   She will return after event monitor is completed.  I also suggested that she does not need to check her BP 3 times a day as all readings have been normal.   Other Orders: Misc. Referral (Misc. Ref)  Patient Instructions: 1)  Your physician recommends that you return have lab today---TSH/ Free T4/ Free T3  786.50   785.0 2)  Your physician has recommended that you have a sleep study.  This test records several body functions during sleep, including:  brain activity, eye movement, oxygen and carbon dioxide blood levels, heart rate and rhythm, breathing rate and rhythm, the flow of air through your mouth and nose, snoring, body muscle movements, and chest and belly movement. 3)  Your physician recommends that you schedule a follow-up appointment in: 3-4 weeks after the sleep study has been done.

## 2010-05-13 NOTE — Letter (Signed)
Summary: Return To Work  [employer], Main Office  [address]   [place], [place] [address]   Phone: [phone]  Fax: [phone]    05/04/2010  TO: Leodis Sias IT MAY CONCERN   RE: LIZVETTE LIGHTSEY [address] [place]   The above named individual is under my medical care and may return to work on: Monday February 27,2012.  If you have any further questions or need additional information, please call.     Sincerely,      [name], MD

## 2010-05-13 NOTE — Miscellaneous (Signed)
Summary: Orders Update  Clinical Lists Changes  Orders: Added new Test order of T-T3 by RIA [phone]) - Signed Added new Test order of T-TSH [phone]) - Signed Added new Test order of T-T4, Free [phone]) - Signed

## 2010-05-13 NOTE — Progress Notes (Signed)
Summary: sleep study   Phone Note Call from Patient Call back at [phone]   Caller: Patient Reason for Call: Talk to Nurse Summary of Call: pt calling re sleep study. pt states someone was going to call her to set it up but pt has not heard from anyone. Initial call taken by: Roe Coombs,  May 06, 2010 8:47 AM  Follow-up for Phone Call        pt is aware we need to send completed note before scheduling sleep study-

## 2010-05-15 NOTE — Discharge Summary (Signed)
NAME:  Andrea Bates, Andrea Bates NO.:  [id number]  MEDICAL RECORD NO.:  [id number]           PATIENT TYPE:  I  LOCATION:  6522                         FACILITY:  [facility]  PHYSICIAN:  [name], MD      DATE OF BIRTH:  Sep 15, 1945  DATE OF ADMISSION:  04/27/2010 DATE OF DISCHARGE:  04/28/2010                              DISCHARGE SUMMARY   PRIMARY CARDIOLOGIST:  [name], MD  PRIMARY CARE PROVIDER:  Trellis Paganini, MD  DISCHARGE DIAGNOSIS:  Chest pain without objective evidence of ischemia.  SECONDARY DIAGNOSES: 1. Normal coronary arteries by catheterization this admission. 2. Hyperlipidemia. 3. Hypothyroidism. 4. Diverticulosis. 5. Anxiety. 6. Irritable bowel syndrome. 7. History of colon polyp. 8. Gastroesophageal reflux disease.  ALLERGIES:  SHELLFISH.  PROCEDURE:  Left heart cardiac catheterization for April 28, 2010, showing normal coronary arteries and normal LV function with an EF of 65%.  HISTORY OF PRESENT ILLNESS:  A 65 year old female with prior history of chest pain status post normal Myoview in our office in 2010 and subsequently, normal Myoview at [facility] earlier this year. She has been having ongoing issues with recurrent rest and exertional chest pressure as well as variability and blood pressure causing significant amount of anxiety.  She was seen in the office on April 27, 2010, and because of her recurrent chest pain despite two negative Myoviews in the past, decision was made to pursue admission and catheterization.  HOSPITAL COURSE:  The patient ruled out for MI.  She continued to have intermittent chest tightness following admission.  She underwent diagnostic catheterization this morning showing normal coronary arteries and normal LV function with an EF of 65%.  Plan to discharge the patient this afternoon.  She has also complained of intermittent tachy palpitations.  We will arrange for an outpatient 3-week event  monitor, follow up with Dr. Shirlee Latch in approximately 1 month.  DISCHARGE LABORATORY DATA:  Hemoglobin 14.5, hematocrit 41.6, WBC 10.4, platelets 418.  Sodium 136, potassium 3.9, chloride 103, CO2 of 25, BUN 13, creatinine 0.62, glucose 96, total bilirubin 0.3, alkaline phosphatase 120, AST 21, ALT 17, total protein 7.9, albumin 4.2, calcium 9.1, CK 45, MB 1.1, troponin-I less than 0.01.  TSH 1.056.  DISPOSITION:  The patient will be discharged home today in good condition.  FOLLOWUP PLANS AND APPOINTMENTS:  We will arrange for an outpatient event monitor and subsequently follow up with Dr. Shirlee Latch in approximately 1 month.  She will follow up with Dr. Simone Curia in the next few weeks.  DISCHARGE MEDICATIONS: 1. Nasonex 1 spray each nostril daily p.r.n. 2. Sodium chloride nasal spray 1 spray in both nostrils q.i.d. p.r.n. 3. Calcium carbonate 1500 mg daily. 4. Levothyroxine 75 mcg daily. 5. Calciferol 1000 units 4 tablets daily.  OUTSTANDING LABORATORY STUDIES:  None.  DURATION OF DISCHARGE ENCOUNTER:  45 minutes including physician time.     Nicolasa Ducking, ANP   ______________________________ [name], MD    CB/MEDQ  D:  04/28/2010  T:  04/29/2010  Job:  [id number]  cc:   Trellis Paganini, MD  Electronically Signed by Nicolasa Ducking ANP on 05/04/2010 12:04:42  PM Electronically Signed by [name] MD on 05/15/2010 10:34:36 AM

## 2010-05-19 ENCOUNTER — Encounter: Payer: Self-pay | Admitting: Cardiology

## 2010-05-19 NOTE — Progress Notes (Addendum)
   Pt Droped off Attending Physicians Statement to be completed,along with Disability Packet sent to [employer]. Cala Bradford Mesiemore  May 12, 2010 11:50 AM      Appended Document:  Pt picked up Attending Physicians Statement today.

## 2010-05-20 DIAGNOSIS — G4730 Sleep apnea, unspecified: Secondary | ICD-10-CM

## 2010-05-20 DIAGNOSIS — G4710 Hypersomnia, unspecified: Secondary | ICD-10-CM

## 2010-05-22 ENCOUNTER — Encounter: Payer: Self-pay | Admitting: Cardiology

## 2010-05-22 ENCOUNTER — Telehealth: Payer: Self-pay | Admitting: Cardiology

## 2010-05-26 ENCOUNTER — Telehealth: Payer: Self-pay | Admitting: Cardiology

## 2010-05-26 ENCOUNTER — Other Ambulatory Visit: Payer: Self-pay | Admitting: Cardiology

## 2010-05-26 ENCOUNTER — Ambulatory Visit (INDEPENDENT_AMBULATORY_CARE_PROVIDER_SITE_OTHER): Payer: PRIVATE HEALTH INSURANCE | Admitting: Cardiology

## 2010-05-26 ENCOUNTER — Telehealth: Payer: Self-pay | Admitting: Internal Medicine

## 2010-05-26 ENCOUNTER — Encounter: Payer: Self-pay | Admitting: Cardiology

## 2010-05-26 DIAGNOSIS — R002 Palpitations: Secondary | ICD-10-CM

## 2010-05-26 DIAGNOSIS — R000 Tachycardia, unspecified: Secondary | ICD-10-CM

## 2010-05-26 LAB — BASIC METABOLIC PANEL
BUN: 10 mg/dL (ref 6–23)
Calcium: 9.7 mg/dL (ref 8.4–10.5)
Creatinine, Ser: 0.6 mg/dL (ref 0.4–1.2)
GFR: 111.51 mL/min (ref >60.00)
Glucose, Bld: 78 mg/dL (ref 70–99)
Sodium: 142 mEq/L (ref 135–145)

## 2010-05-26 LAB — MAGNESIUM: Magnesium: 2.5 mg/dL (ref 1.5–2.5)

## 2010-06-02 ENCOUNTER — Ambulatory Visit (INDEPENDENT_AMBULATORY_CARE_PROVIDER_SITE_OTHER): Payer: PRIVATE HEALTH INSURANCE | Admitting: Endocrinology

## 2010-06-02 ENCOUNTER — Other Ambulatory Visit: Payer: PRIVATE HEALTH INSURANCE

## 2010-06-02 ENCOUNTER — Encounter: Payer: Self-pay | Admitting: Endocrinology

## 2010-06-02 DIAGNOSIS — R002 Palpitations: Secondary | ICD-10-CM

## 2010-06-02 NOTE — Letter (Signed)
Summary: Return To Work  [employer], Main Office  [address]   [place], [place] [address]   Phone: [phone]  Fax: [phone]    05/26/2010  TO: Leodis Sias IT MAY CONCERN   RE: [place] LAIR [address] [place]   The above named individual is under my medical care and may return to work on: Monday March 12,2012  If you have any further questions or need additional information, please call.     Sincerely,    Dalton McLean,MD

## 2010-06-02 NOTE — Progress Notes (Signed)
Summary: heart racing,dizziness,head feeling funny/ b/p issues   Phone Note Call from Patient Call back at [phone]   Caller: Patient Summary of Call: Heart racing when pt goes to sleep B/P runs high dizzy when getting up head feeling funny Initial call taken by: Judie Grieve,  May 22, 2010 8:41 AM  Follow-up for Phone Call        [facility] Katina Dung, RN, BSN  May 22, 2010 10:05 AM  please call [phone] she is not at the home number please call this cell number Glynda Jaeger  May 22, 2010 11:21 AM--I talked with pt--pt states she continues to have racing heart,especially at night--she also states that when she eats a little more than usual  she feels her heart speed up--she states these are the same symptoms she has had in the past and during the time she had the monitor--I received monitor 04/29/10-05/19/10 today--pt also requesting results of sleep study done last week--pt is asking if heart checks out OK what is next step--?GI or neuro  referral--Dr Simone Curia is her PCP   Additional Follow-up for Phone Call Additional follow up Details #1::        per pt hubsand calling this am . wife is c/o b/p 156/95 @ 4 a.m. wanted to know can pt be seen today . [phone] Lorne Skeens  May 26, 2010 8:05 AM-Dr Shirlee Latch reviewed monitor and sleep study--he recommended Lopressor 25mg  two times a day and keep appt already scheduled with him 07/09/10--pt requesting appt with Dr Shirlee Latch today --I have given pt an appt with Dr Shirlee Latch today  at [facility]

## 2010-06-02 NOTE — Progress Notes (Signed)
Summary: monitor results 04/29/10-05/19/10   Phone Note Other Incoming   Summary of Call: monitor results  Follow-up for Phone Call        Dr Shirlee Latch reviewed monitor 04/29/10-05/19/10--pt had OV with Dr Shirlee Latch 05/26/10--monitor results reviewed  at time of visit --pt started on Diltiazem 30mg  three times a day --original monitor returned  to [facility]

## 2010-06-02 NOTE — Assessment & Plan Note (Signed)
Summary: [facility]  Medications Added DILTIAZEM HCL 30 MG TABS (DILTIAZEM HCL) take one three times a day        Visit Type:  Follow-up Referring Provider:  n/a Primary Provider:  Dr. Simone Curia  CC:  palpitations.  History of Present Illness: Andrea Bates is a 65 yo female with h/o hyperlipidemia and family history of early CAD who is seen in followup today.  She was admitted to [facility] in 2/12 with atypical chest pain.  She had had a negative myoview at [facility] in 2011 but continued to have chest pain. She underwent cardiac catheterization during her recent admission which showed normal coronaries and normal LV systolic function.    She continues to have atypical, nonexertional chest pain.  This is often associated with palpitations.  She feels her heart race and skip.  She gets periodic lightheaded spells though all blood pressure readings from home are within normal limits (she checks her BP several times a day).  She wore a 3 week event monitor which showed rare PVCs and PACs.  She had some sinus tachycardia with rates to the 110s - 120s on occasion.  She had a couple of 4 beat runs of probable atrial tachycardia.  She activated the monitor when she felt lightheaded, but rhythm was unremarkable at those times.  She had a sleep study that was normal.  She had thyroid tests done that were normal.  She is very worried that she has hyperthyroidism despite the lab tests.  She is anxious in general.  I suggested that she start on a beta blocker, but her PCP put her on atenolol a few weeks back and she apparently passed out after taking the first dose.   Labs (2/12): TSH, free T4, free T3 all normal.  K 3.9, creatinine 0.62.    Current Medications (verified): 1)  Synthroid 75 Mcg Tabs (Levothyroxine Sodium) .... Take One Tab Daily 2)  Vitamin D 1000 Unit Tabs (Cholecalciferol) .... 06-4998 Units Daily 3)  Nasonex 50 Mcg/act Susp (Mometasone Furoate) .... As Directed 4)   Fish Oil 1000 Mg Caps (Omega-3 Fatty Acids) .... As Directed 5)  Calcium 600 1500 Mg Tabs (Calcium Carbonate) .... One Tablet By Mouth Once Daily  Allergies: 1)  * Shellfish  Past History:  Past Medical History: 1.  Hyperlipidemia:  she is now taking fish oil.  She had myalgias with Vytorin and Lipitor.  2.  Hypothyroidism. 3.  Diverticulosis.  4.  Anxiety.  5.  IBS 6.  Colonic polyps./ADENOMATOUS-LAST COLON 11/2007  7.  GERD 8.  Chest pain: Myoview at [facility] in 2011 was normal.  LHC (2/12) showed normal coronaries, EF 65%. 9.  Echo (2/10): EF 55-60%, grade I diastolic dysfunction 10.  Palpitations: Occasional sinus tachycardia with rates 110s-120s.  Rare PACs/PVCs.  Several short 4-5 beat runs of atrial tachycardia.  11.  Sleep study normal 2/12  Family History: Reviewed history from 03/31/2009 and no changes required. Father with h/o 2 heart valve replacements.  He also had a stroke.  Brother with MI at age 23.   Family History of Colon Cancer:Great Uncle on Paternal Side   Social History: Reviewed history from 05/07/2008 and no changes required. Lives with husband in [place].  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  No ETOH.    Review of Systems       All systems reviewed and negative except as per HPI.   Vital Signs:  Patient profile:   64  year old female Height:      61 inches Weight:      111 pounds BMI:     21.05 Pulse rate:   80 / minute Pulse rhythm:   regular Resp:     18 per minute BP sitting:   149 / 90  (left arm) Cuff size:   small  Vitals Entered By: Vikki Ports (May 26, 2010 11:52 AM)  Physical Exam  General:  Well developed, well nourished, in no acute distress. Neck:  Neck supple, no JVD. No masses, thyromegaly or abnormal cervical nodes. Lungs:  Clear bilaterally to auscultation and percussion. Heart:  Non-displaced PMI, chest non-tender; regular rate and rhythm, S1, S2 without murmurs, rubs or gallops. Carotid upstroke normal,  no bruit. Pedals normal pulses. No edema, no varicosities. Abdomen:  Bowel sounds positive; abdomen soft and non-tender without masses, organomegaly, or hernias noted. No hepatosplenomegaly. Extremities:  No clubbing or cyanosis. Neurologic:  Alert and oriented x 3. Psych:  anxious.     Impression & Recommendations:  Problem # 1:  CHEST PAIN-UNSPECIFIED (ICD-786.50) Atypical, noncardiac.  Normal catheterization last month.   Problem # 2:  UNSPECIFIED TACHYCARDIA (ICD-785.0) Assessment: New Patient feels her heart race and skip.  Event monitor was fairly unremarkable with a few PACs and PVCs and a couple of 4 beat runs of probable atrial tachycardia.  She did have some sinus tachycardia on the monitor.  This was fairly mild.  Her symptoms are quite bothersome.  It is possible that she has either anxiety-driven sinus tachycardia on occasion or possibly inappropriate sinus tachycardia.  She does not want to try a beta blocker again.  I suggested that she try short-acting diltiazem to begin with, 30 mg three times a day.  If she tolerates this, can change to diltiazem CD 120 mg daily.  Will check K and Mg levels today.   Problem # 3:  HYPOTHYROIDISM (ICD-244.9) TSH, free T4, and free T3 were normal.  Patient believes that her symptoms are related to thyroid dysregulation.  I cannot find evidence of this.  I will have her see Dr. Everardo All to see if there is anything else that can be done to evaluate her for endocrine problems.    Other Orders: Misc. Referral (Misc. Ref) TLB-BMP (Basic Metabolic Panel-BMET) (80048-METABOL) TLB-Magnesium (Mg) (83735-MG)  Patient Instructions: 1)  Your physician has recommended you make the following change in your medication:  2)  Start Diltiazem 30mg  three times a day--THE FIRST TIME YOU TAKE THIS TAKE ONE-HALF OF A 30MG  TABLET. If you tolerate this then take one 30mg  tablet three times a day. 3)  Lab today--BMP/Magnesium  785.0 4)  You have been referred to Dr  Everardo All. 5)  You have an appointment with Dr Shirlee Latch Monday March 19,2012 at 11:45am. Prescriptions: DILTIAZEM HCL 30 MG TABS (DILTIAZEM HCL) take one three times a day  #90 x 6   Entered by:   Katina Dung, RN, BSN   Authorized by:   [name], MD   Signed by:   Katina Dung, RN, BSN on 05/26/2010   Method used:   Electronically to        [facility]  [phone] (retail)       [address]       [place], [place]  [address]       Ph: [phone] or [phone]       Fax: [phone]   RxID:  [id number]    

## 2010-06-02 NOTE — Progress Notes (Signed)
Summary: triage: Chest tightnesss, burning in stomach   Phone Note Call from Patient Call back at [phone]   Caller: Patient Call For: Dr. Marina Goodell Reason for Call: Talk to Nurse Summary of Call: Patient has been having a lot of pain in her chest and trouble swallowing, she says her heart races when she eats and when she swallows sometimes she cant breath, wants to be see soon if possible Initial call taken by: [place] Johnson,  May 26, 2010 9:13 AM  Follow-up for Phone Call        Patient states that she has been worked up by her cardiologist and they have found nothing wrong. She states that her heart is racing and speeding up, this is worse when she eats. C/o of not sleeping at night due to lots of chest tightness and burning in her stomach. Patient states that in the past she was diagnosed with a hiatal hernia by Dr. Marina Goodell. Patient is requesting an endoscopy. Dr. Marina Goodell please advise. Follow-up by: Selinda Michaels RN,  May 26, 2010 9:52 AM  Additional Follow-up for Phone Call Additional follow up Details #1::         looks like she called the cardiologist last week , but no response. Also looks like they want to set up a sleep study. She needs to wrap things up with the cardiologist , and sleep study. Also , needs to have a physician refer her for a GI evaluation, if they think that her problem might be GI (though I doubt it). Additional Follow-up by: Hilarie Fredrickson MD,  May 26, 2010 10:15 AM    Additional Follow-up for Phone Call Additional follow up Details #2::    Patient aware of Dr. Lamar Sprinkles recommendations. Follow-up by: Selinda Michaels RN,  May 26, 2010 10:38 AM

## 2010-06-04 ENCOUNTER — Ambulatory Visit: Payer: PRIVATE HEALTH INSURANCE | Admitting: Cardiology

## 2010-06-05 ENCOUNTER — Encounter: Payer: Self-pay | Admitting: Endocrinology

## 2010-06-08 ENCOUNTER — Encounter: Payer: Self-pay | Admitting: Cardiology

## 2010-06-08 ENCOUNTER — Other Ambulatory Visit: Payer: Self-pay | Admitting: Cardiology

## 2010-06-08 ENCOUNTER — Emergency Department (HOSPITAL_COMMUNITY)
Admission: EM | Admit: 2010-06-08 | Discharge: 2010-06-08 | Disposition: A | Discharge status: Home / Self Care | Payer: PRIVATE HEALTH INSURANCE | Attending: Emergency Medicine | Admitting: Emergency Medicine

## 2010-06-08 ENCOUNTER — Ambulatory Visit (INDEPENDENT_AMBULATORY_CARE_PROVIDER_SITE_OTHER): Payer: PRIVATE HEALTH INSURANCE | Admitting: Cardiology

## 2010-06-08 DIAGNOSIS — E039 Hypothyroidism, unspecified: Secondary | ICD-10-CM | POA: Insufficient documentation

## 2010-06-08 DIAGNOSIS — H8309 Labyrinthitis, unspecified ear: Secondary | ICD-10-CM | POA: Insufficient documentation

## 2010-06-08 DIAGNOSIS — R002 Palpitations: Secondary | ICD-10-CM

## 2010-06-08 DIAGNOSIS — R42 Dizziness and giddiness: Secondary | ICD-10-CM | POA: Insufficient documentation

## 2010-06-08 DIAGNOSIS — Z79899 Other long term (current) drug therapy: Secondary | ICD-10-CM | POA: Insufficient documentation

## 2010-06-08 LAB — URINALYSIS, ROUTINE W REFLEX MICROSCOPIC
Bilirubin Urine: NEGATIVE
Glucose, UA: NEGATIVE mg/dL
Ketones, ur: NEGATIVE mg/dL
Protein, ur: NEGATIVE mg/dL
pH: 7 (ref 5.0–8.0)

## 2010-06-08 LAB — CBC
Platelets: 406 10*3/uL — ABNORMAL HIGH (ref 150–400)
RBC: 4.38 MIL/uL (ref 3.87–5.11)
RDW: 13.6 % (ref 11.5–15.5)
WBC: 11.1 10*3/uL — ABNORMAL HIGH (ref 4.0–10.5)

## 2010-06-08 LAB — BASIC METABOLIC PANEL
BUN: 13 mg/dL (ref 6–23)
Creatinine, Ser: 0.56 mg/dL (ref 0.4–1.2)
GFR calc non Af Amer: >60 mL/min (ref >60)
Glucose, Bld: 100 mg/dL — ABNORMAL HIGH (ref 70–99)
Potassium: 3.4 mEq/L — ABNORMAL LOW (ref 3.5–5.1)

## 2010-06-08 LAB — DIFFERENTIAL
Basophils Absolute: 0 10*3/uL (ref 0.0–0.1)
Basophils Relative: 0 % (ref 0–1)
Eosinophils Absolute: 0.1 10*3/uL (ref 0.0–0.7)
Eosinophils Relative: 1 % (ref 0–5)
Neutrophils Relative %: 75 % (ref 43–77)

## 2010-06-08 LAB — POCT CARDIAC MARKERS
CKMB, poc: <1 ng/mL — ABNORMAL LOW (ref 1.0–8.0)
Troponin i, poc: <0.05 ng/mL (ref 0.00–0.09)

## 2010-06-08 LAB — CONVERTED CEMR LAB
Metaneph Total, Ur: 334 ug/24hr (ref 224–832)
Metanephrines, Ur: 103 (ref 90–315)

## 2010-06-09 NOTE — Letter (Signed)
Summary: Attending [employer]icians [employer]  Att[employer]   Imported By: Erle Crocker 06/01/2010 09:25:05  _____________________________________________________________________  External Attachment:    Type:   Image     Comment:   External Document

## 2010-06-09 NOTE — Procedures (Signed)
Summary: Summary Report  Summary Report   Imported By: Erle Crocker 06/05/2010 14:46:26  _____________________________________________________________________  External Attachment:    Type:   Image     Comment:   External Document

## 2010-06-09 NOTE — Assessment & Plan Note (Addendum)
Summary: new endo/hypothyroidism/Keung Lee/medcost/#lb   Vital Signs:  Patient profile:   65 year old female Height:      61 inches (154.94 cm) Weight:      113.25 pounds (51.48 kg) BMI:     21.48 O2 Sat:      97 % on Room air Temp:     98.4 degrees F (36.89 degrees C) oral Pulse rate:   77 / minute Pulse rhythm:   regular BP sitting:   126 / 82  (left arm) Cuff size:   regular  Vitals Entered By: Brenton Grills CMA Duncan Dull) (June 02, 2010 2:37 PM)  O2 Flow:  Room air CC: New Endo Consult/Hypothyroidism/Dr. Marquette Saa Lee/aj Is Patient Diabetic? No  Does patient need assistance? Functional Status Social activities Comments Pt is no longer taking Diltiazem   Referring Provider:  [name] MD Primary Provider:  Dr. Simone Curia  CC:  New Endo Consult/Hypothyroidism/Dr. Marquette Saa Lee/aj.  History of Present Illness: pt says she was dx'ed with thyroiditis approx 15 years ago.  this was manifest as hyperthyroidism, then hypothyroidism.  she went through a phase of euthyroidism.  then she was found to be hypothyroid again approx 5 years ago.  she was rx'ed with synthroid, and has been on ever since.   pt states few mos of moderate palpitations in the chest, and assoc dizziness.  these signs are improved recently.  Current Medications (verified): 1)  Synthroid 75 Mcg Tabs (Levothyroxine Sodium) .... Take One Tab Daily 2)  Vitamin D 1000 Unit Tabs (Cholecalciferol) .... 06-4998 Units Daily 3)  Nasonex 50 Mcg/act Susp (Mometasone Furoate) .... As Directed 4)  Fish Oil 1000 Mg Caps (Omega-3 Fatty Acids) .... As Directed 5)  Calcium 600 1500 Mg Tabs (Calcium Carbonate) .... One Tablet By Mouth Once Daily 6)  Diltiazem Hcl 30 Mg Tabs (Diltiazem Hcl) .... Take One Three Times A Day 7)  B-100  Tabs (Vitamins-Lipotropics) .Marland Kitchen.. 1 By Mouth Once Daily 8)  Flax Seed Oil 1000 Mg Caps (Flaxseed (Linseed)) .Marland Kitchen.. 1 Capsule By Mouth Once Daily  Allergies (verified): 1)  * Shellfish  Past  History:  Past Medical History: Last updated: 05/26/2010 1.  Hyperlipidemia:  she is now taking fish oil.  She had myalgias with Vytorin and Lipitor.  2.  Hypothyroidism. 3.  Diverticulosis.  4.  Anxiety.  5.  IBS 6.  Colonic polyps./ADENOMATOUS-LAST COLON 11/2007  7.  GERD 8.  Chest pain: Myoview at [facility] in 2011 was normal.  LHC (2/12) showed normal coronaries, EF 65%. 9.  Echo (2/10): EF 55-60%, grade I diastolic dysfunction 10.  Palpitations: Occasional sinus tachycardia with rates 110s-120s.  Rare PACs/PVCs.  Several short 4-5 beat runs of atrial tachycardia.  11.  Sleep study normal 2/12  Family History: Reviewed history from 03/31/2009 and no changes required. Father with h/o 2 heart valve replacements.  He also had a stroke.  Brother with MI at age 73.   Family History of Colon Cancer:Great Uncle on Paternal Side  grandother had a goiter.   Social History: Reviewed history from 05/07/2008 and no changes required. Lives with husband in [place].  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  No ETOH.    Review of Systems       denies cramps, sob, weight gain, memory loss, numbness, blurry vision, myalgias, dry skin, and rhinorrhea.  she reports depresssion, constipation, dry skin, easy bruising, and hair loss.  she had 1 episode of syncope.     Physical Exam  General:  normal appearance.   Head:  head: no deformity eyes: no periorbital swelling, no proptosis external nose and ears are normal mouth: no lesion seen Neck:  Supple without thyroid enlargement or tenderness.  Lungs:  Clear to auscultation bilaterally. Normal respiratory effort.  Heart:  Regular rate and rhythm without murmurs or gallops noted. Normal S1,S2.   Abdomen:  abdomen is soft, nontender.  no hepatosplenomegaly.   not distended.  no hernia Msk:  muscle bulk and strength are grossly normal.  no obvious joint swelling.  gait is normal and steady  Extremities:  no deformity no  edema Neurologic:  cn 2-12 grossly intact.   readily moves all 4's.   sensation is intact to touch on the feet Skin:  normal texture and temp.  no rash.  not diaphoretic  Cervical Nodes:  No significant adenopathy.  Psych:  Alert and cooperative; normal mood and affect; normal attention span and concentration.     Impression & Recommendations:  Problem # 1:  HYPOTHYROIDISM (ICD-244.9) well-replaced FT4: 1.60 (05/04/2010)   TSH: 0.457 (05/04/2010)     Problem # 2:  PALPITATIONS (ICD-785.1) uncertain etiology.  Problem # 3:  depression not related to #1  Medications Added to Medication List This Visit: 1)  B-100 Tabs (Vitamins-lipotropics) .Marland Kitchen.. 1 by mouth once daily 2)  Flax Seed Oil 1000 Mg Caps (Flaxseed (linseed)) .Marland Kitchen.. 1 capsule by mouth once daily  Other Orders: T-Urine 24 Hr. Metanephrines [phone]) T-Urine 24 Hr. Catecholamines [phone]) Consultation Level IV (78469)  Patient Instructions: 1)  you should continue the same synthrod. 2)  check 24-hr urine for "adrenaline."  then  please call [phone] to hear your test results. 3)  return here as needed.   Orders Added: 1)  T-Urine 24 Hr. Metanephrines [[id number]] 2)  T-Urine 24 Hr. Catecholamines [phone] 3)  Consultation Level IV [74259]

## 2010-06-13 ENCOUNTER — Ambulatory Visit
Admission: RE | Admit: 2010-06-13 | Discharge: 2010-06-13 | Disposition: A | Discharge status: Home / Self Care | Payer: PRIVATE HEALTH INSURANCE | Source: Ambulatory Visit | Attending: Cardiology | Admitting: Cardiology

## 2010-06-13 DIAGNOSIS — R42 Dizziness and giddiness: Secondary | ICD-10-CM

## 2010-06-13 MED ORDER — GADOBENATE DIMEGLUMINE 529 MG/ML IV SOLN
10.0000 mL | Freq: Once | INTRAVENOUS | Status: AC | PRN
  Administered 2010-06-13: 10 mL via INTRAVENOUS

## 2010-06-15 ENCOUNTER — Telehealth: Payer: Self-pay | Admitting: Cardiology

## 2010-06-15 NOTE — Telephone Encounter (Signed)
Diltiazem will not make her heart rate increase.  Ask her to try to stick with the medication for a little longer.

## 2010-06-15 NOTE — Telephone Encounter (Signed)
Patient return a call. She said  In  the office visit  on 06/06/10 Diltiazem 30 mg 3 times a day, was changed  to 120 mg once a day. Pt. States she has been having fast palpitations when she goes to sleep at night . When she wakes up the heart rate goes back to normal.  The fast heart rate  wakes her up about 3 to 5 times at night. This is happening since last Friday. Pt. States she was having fast palpitations before the medication dose was changed, but not as bad.

## 2010-06-15 NOTE — Telephone Encounter (Signed)
Pt calling wanting to speak with the nurse concerning her heart rate at night.

## 2010-06-17 ENCOUNTER — Telehealth: Payer: Self-pay | Admitting: Cardiology

## 2010-06-17 NOTE — Telephone Encounter (Signed)
LMTCB

## 2010-06-17 NOTE — Telephone Encounter (Signed)
[name], MD 06/15/2010 10:08 PM Signed  Diltiazem will not make her heart rate increase. Ask her to try to stick with the medication for a little longer.  [name], RN 06/15/2010 3:34 PM Signed  Patient return a call. She said In the office visit on 06/06/10 Diltiazem 30 mg 3 times a day, was changed to 120 mg once a day. Pt. States she has been having fast palpitations when she goes to sleep at night . When she wakes up the heart rate goes back to normal. The fast heart rate wakes her up about 3 to 5 times at night. This is happening since last Friday. Pt. States she was having fast palpitations before the medication dose was changed, but not as bad.  LMTCB

## 2010-06-17 NOTE — Telephone Encounter (Signed)
LMTCB Kendy Haston<RN

## 2010-06-17 NOTE — Telephone Encounter (Signed)
I talked with pt by telephone 

## 2010-06-18 NOTE — Assessment & Plan Note (Signed)
Summary: f/u 3-4wks/per check out/sf   Referring Provider:  [name] MD Primary Provider:  Dr. Simone Curia  CC:  follow up.  Pt at ED for "full-headed" feeling and nausea.  .  History of Present Illness: Andrea Bates is a 65 yo female with h/o hyperlipidemia and family history of early CAD who is seen in followup today.  She was admitted to [facility] in 2/12 with atypical chest pain.  She had had a negative myoview at [facility] in 2011 but continued to have chest pain. She underwent cardiac catheterization during her recent admission which showed normal coronaries and normal LV systolic function.    She continues to feel her heart race and skip.  She gets periodic lightheaded spells though all blood pressure readings from home are within normal limits (she checks her BP frequently).  She wore a 3 week event monitor which showed rare PVCs and PACs.  She had some sinus tachycardia with rates to the 110s - 120s on occasion.  She had a couple of 4 beat runs of probable atrial tachycardia.  She activated the monitor when she felt lightheaded, but rhythm was unremarkable at those times.  She had a sleep study that was normal.  She had thyroid tests done that were normal and was seen by Dr. Everardo All for endocrine evaluation with no significant abnormalities found.  I had her take ditiazem 30 mg three times a day which she initially tolerated but then stopped when she felt squeezing in her chest.    This morning, patient woke up with "dizziness" (? vertigo) and nausea/vomiting.  She went to the ER and was given IV fluids and discharged.    Labs (2/12): TSH, free T4, free T3 all normal.  K 3.9, creatinine 0.62.  Labs (3/12): K 4.8, creatinine 0.6, Mg 2.5, urine catecholeamines normal   Current Medications (verified): 1)  Synthroid 75 Mcg Tabs (Levothyroxine Sodium) .... Take One Tab Daily 2)  Vitamin D 1000 Unit Tabs (Cholecalciferol) .... 06-4998 Units Daily 3)  Nasonex 50 Mcg/act Susp  (Mometasone Furoate) .... As Directed 4)  Fish Oil 1000 Mg Caps (Omega-3 Fatty Acids) .... As Directed 5)  Calcium 600 1500 Mg Tabs (Calcium Carbonate) .... One Tablet By Mouth Once Daily 6)  Diltiazem Hcl 30 Mg Tabs (Diltiazem Hcl) .... Take One As Needed 7)  B-100  Tabs (Vitamins-Lipotropics) .Marland Kitchen.. 1 By Mouth Once Daily 8)  Flax Seed Oil 1000 Mg Caps (Flaxseed (Linseed)) .Marland Kitchen.. 1 Capsule By Mouth Once Daily 9)  Antivert 25 Mg Tabs (Meclizine Hcl) .... Take One Tablet Every 6 Hrs As Needed 10)  Zofran 4 Mg Tabs (Ondansetron Hcl) .... Take One Tablet As Needed  Allergies (verified): 1)  * Shellfish  Past History:  Past Medical History: Reviewed history from 05/26/2010 and no changes required. 1.  Hyperlipidemia:  she is now taking fish oil.  She had myalgias with Vytorin and Lipitor.  2.  Hypothyroidism. 3.  Diverticulosis.  4.  Anxiety.  5.  IBS 6.  Colonic polyps./ADENOMATOUS-LAST COLON 11/2007  7.  GERD 8.  Chest pain: Myoview at [facility] in 2011 was normal.  LHC (2/12) showed normal coronaries, EF 65%. 9.  Echo (2/10): EF 55-60%, grade I diastolic dysfunction 10.  Palpitations: Occasional sinus tachycardia with rates 110s-120s.  Rare PACs/PVCs.  Several short 4-5 beat runs of atrial tachycardia.  11.  Sleep study normal 2/12  Family History: Reviewed history from 06/02/2010 and no changes required. Father with h/o 2 heart  valve replacements.  He also had a stroke.  Brother with MI at age 12.   Family History of Colon Cancer:Great Uncle on Paternal Side  grandother had a goiter.   Social History: Reviewed history from 05/07/2008 and no changes required. Lives with husband in [place].  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  No ETOH.    Review of Systems       All systems reviewed and negative except as per HPI.   Vital Signs:  Patient profile:   65 year old female Height:      61 inches Weight:      113 pounds BMI:     21.43 Pulse rate:   78 / minute Pulse  rhythm:   regular BP sitting:   142 / 86  (left arm) Cuff size:   regular  Vitals Entered By: Judithe Modest CMA (June 08, 2010 12:18 PM)  Physical Exam  General:  Well developed, well nourished, in no acute distress. Neck:  Neck supple, no JVD. No masses, thyromegaly or abnormal cervical nodes. Lungs:  Clear bilaterally to auscultation and percussion. Heart:  Non-displaced PMI, chest non-tender; regular rate and rhythm, S1, S2 without murmurs, rubs or gallops. Carotid upstroke normal, no bruit. Pedals normal pulses. No edema, no varicosities. Abdomen:  Bowel sounds positive; abdomen soft and non-tender without masses, organomegaly, or hernias noted. No hepatosplenomegaly. Extremities:  No clubbing or cyanosis. Neurologic:  Alert and oriented x 3. Psych:  Normal affect.   Impression & Recommendations:  Problem # 1:  VERTIGO (ICD-780.4) Patient woke up with vertigo-type symptoms associated with nausea and vomiting.  This has resolved.  Patient is concerned that she developed some sort of CNS problem after falling not long ago.  I will get an MRI to assess posterior circulation territory for stroke.    Problem # 2:  UNSPECIFIED TACHYCARDIA (ICD-785.0) Patient feels her heart race and skip.  Event monitor was fairly unremarkable with a few PACs and PVCs and a couple of 4 beat runs of probable atrial tachycardia.  She did have some sinus tachycardia on the monitor.  This was fairly mild.  Her symptoms are quite bothersome.  It is possible that she has either anxiety-driven sinus tachycardia on occasion or possibly inappropriate sinus tachycardia.  She does not want to try a beta blocker again.   Today I am going to start her on long-acting diltiazem, 120 mg daily.  Hopefully, this will prevent some of the fluctuations in heart rate that cause her symptoms.   Other Orders: MRI (MRI)  Patient Instructions: 1)  Your physician has recommended you make the following change in your medication:    2)  Take Diltiazem CD 120mg  daily--do not take Diltiazem 30mg . 3)  Schedule an MRI of your head. 4)  Your physician recommends that you schedule a follow-up appointment in: 1 month with Dr Shirlee Latch. Prescriptions: DILTIAZEM HCL ER BEADS 120 MG XR24H-CAP (DILTIAZEM HCL ER BEADS) one daily  #30 x 6   Entered by:   Katina Dung, RN, BSN   Authorized by:   [name], MD   Signed by:   Katina Dung, RN, BSN on 06/08/2010   Method used:   Electronically to        [facility]  [address] [phone]* (retail)       [address] [place], [place]  [address]       Ph:  [phone] or [phone]       Fax: [phone]   RxID:   [id number]

## 2010-06-23 ENCOUNTER — Ambulatory Visit: Payer: PRIVATE HEALTH INSURANCE | Admitting: Cardiology

## 2010-06-23 ENCOUNTER — Ambulatory Visit: Payer: PRIVATE HEALTH INSURANCE | Admitting: Endocrinology

## 2010-06-29 ENCOUNTER — Telehealth: Payer: Self-pay | Admitting: Internal Medicine

## 2010-06-29 NOTE — Telephone Encounter (Signed)
Pt requesting to be seen sooner than scheduled appt. Pt c/o stomach spasms and wants to be seen. Scheduled her to see Lucrezia Europe NP 07/03/10@1 :30pm. Pt aware of appt date and time.

## 2010-07-03 ENCOUNTER — Encounter: Payer: Self-pay | Admitting: Nurse Practitioner

## 2010-07-03 ENCOUNTER — Ambulatory Visit (INDEPENDENT_AMBULATORY_CARE_PROVIDER_SITE_OTHER): Payer: PRIVATE HEALTH INSURANCE | Admitting: Nurse Practitioner

## 2010-07-03 DIAGNOSIS — K5900 Constipation, unspecified: Secondary | ICD-10-CM

## 2010-07-03 DIAGNOSIS — R1310 Dysphagia, unspecified: Secondary | ICD-10-CM

## 2010-07-03 DIAGNOSIS — R1319 Other dysphagia: Secondary | ICD-10-CM

## 2010-07-03 NOTE — Progress Notes (Signed)
07/03/2010 Andrea Bates [id number] [birth year]   History of Present Illness: Andrea Bates is a 65 year old female known to Dr. Marina Goodell for history IBS, adenomatous colon polyps and diverticular disease. She has a history of globus sensation with normal findings on EGD in 2006. She had a normal EGD in 2004 which was done for chest pain. Over the last few years Andrea Bates has mainly been seen here for IBS type symptoms and rectal bleeding. She come in today with her husband for evaluation of dysphagia. In February patient was hospitalized for chest pain. A cardiac catherization was negative. For several weeks she has been having chest pain and palpitations often associated with eating but also occuring in the evenings and in the middle of the night. Dr. Shirlee Latch had her wear a cardiac event monitor for 3 weeks which showed rare PVCs and PACs.  She had some sinus tachycardia with rates to the 110s - 120s. Thyroid studies were within normal limits, she was seen by Dr. Everardo All. After complaining of nausea,vomiting, dizziness Dr.McLean ordered an MRI of the head which was unrevealing. She has had a negative sleep study. In addition to her ongoing palpitations Andrea Bates feels like her esophagus is closing up when she eats, feels like she is choking. Patient doesn't feel that her symptoms are related to GERD as she usually gets globus sensation with GERD and she isn't having that now.  Food "just sits there like a pile of rocks". No pyrosis. Gives history of 10 pound weight loss since February. Feels her weight is still trending down.   Andrea Bates complains of new onset constipation. No rectal bleeding. She inquires about need to repeat her colonoscopy. Drinking "Smooth Move" tea helps constipation and for some reason, palpitations as well.     Past Medical History  Diagnosis Date  . Tachycardia   . Diverticulosis   . Hiatal hernia   . Unspecified hypothyroidism   . Personal history of colonic  polyps   . Personal history of malignant neoplasm of breast   . Hemorrhoids   . Other and unspecified hyperlipidemia   . Palpitations    Past Surgical History  Procedure Date  . Nasal sinus surgery   . Tonsillectomy   . Carpal tunnel release     bilateral     reports that she has never smoked. She has never used smokeless tobacco. She reports that she does not drink alcohol or use illicit drugs. family history includes Colon cancer in an unspecified family member. No Known Allergies     Outpatient Encounter Prescriptions as of 07/03/2010  Medication Sig Dispense Refill  . Calcium Carbonate-Vitamin D (CALCIUM + D PO) Take by mouth.        . Cyanocobalamin (VITAMIN B 12 PO) Take by mouth.        . diltiazem (CARDIZEM) 30 MG tablet Take 30 mg by mouth 3 (three) times daily. As needed       . fish oil-omega-3 fatty acids 1000 MG capsule Take 2 g by mouth daily.        Marland Kitchen levothyroxine (SYNTHROID) 75 MCG tablet Take 75 mcg by mouth daily.           ROS: All other systems reviewed and negative except where noted in the History of Present Illness.   Physical Exam: General: Well developed , well nourished, no acute distress Head: Normocephalic and atraumatic Eyes:  sclerae anicteric,conjunctive pink. Ears: Normal auditory acuity Mouth: No deformity or lesions Neck: Supple,  no masses.  Lungs: Clear throughout to auscultation Heart: Regular rate and rhythm; no murmurs heard Abdomen: Soft, non tender and non distended. No masses or hepatomegaly noted. Normal Bowel sounds Rectal: Not done Musculoskeletal: Symmetrical with no gross deformities  Skin: No lesions on visible extremities Extremities: No edema or deformities noted Neurological: Alert oriented x 4, grossly nonfocal Cervical Nodes:  No significant cervical adenopathy Psychological:  Alert and cooperative. Emotional today

## 2010-07-03 NOTE — Patient Instructions (Signed)
Your Endoscopy will be with [name],MD on Monday 07/06/2010 at 10am Cc. Andrea Bates

## 2010-07-04 ENCOUNTER — Encounter: Payer: Self-pay | Admitting: Nurse Practitioner

## 2010-07-05 ENCOUNTER — Encounter: Payer: Self-pay | Admitting: Nurse Practitioner

## 2010-07-05 DIAGNOSIS — K5900 Constipation, unspecified: Secondary | ICD-10-CM | POA: Insufficient documentation

## 2010-07-05 DIAGNOSIS — R1310 Dysphagia, unspecified: Secondary | ICD-10-CM | POA: Insufficient documentation

## 2010-07-05 NOTE — Assessment & Plan Note (Signed)
Several week history of constipation. Patient normally prone to loose stools with history of IBS. She inquires about a colonoscopy.She is not due for surveillance colonoscopy until 2014. While this is a change in bowel habits I don't think, at least at this point, that it warrants a sooner colonoscopy. Constipation could be from the calcium channel blocker. She will be given a trial of Miralax.

## 2010-07-05 NOTE — Assessment & Plan Note (Signed)
Patient has been undergoing evaluation for chest pain and palpitations. So far workup has been negative. Though dysphagia is a new problem for her it seems to be tied into the chest pain and palpitations. Having said that, patient has lost weight and cardiac workup has been negative. For further evaluation Andrea Bates will be scheduled for EGD. The benefits, risks and alternatives to the procedure were discussed and she agrees to proceed.

## 2010-07-06 ENCOUNTER — Encounter: Payer: Self-pay | Admitting: Internal Medicine

## 2010-07-06 ENCOUNTER — Ambulatory Visit (AMBULATORY_SURGERY_CENTER): Payer: PRIVATE HEALTH INSURANCE | Admitting: Internal Medicine

## 2010-07-06 DIAGNOSIS — K2100 Gastro-esophageal reflux disease with esophagitis, without bleeding: Secondary | ICD-10-CM

## 2010-07-06 DIAGNOSIS — R1310 Dysphagia, unspecified: Secondary | ICD-10-CM

## 2010-07-06 DIAGNOSIS — K209 Esophagitis, unspecified without bleeding: Secondary | ICD-10-CM

## 2010-07-06 DIAGNOSIS — F458 Other somatoform disorders: Secondary | ICD-10-CM

## 2010-07-06 DIAGNOSIS — R079 Chest pain, unspecified: Secondary | ICD-10-CM

## 2010-07-06 MED ORDER — SODIUM CHLORIDE 0.9 % IV SOLN: 500.0000 mL | INTRAVENOUS | Status: DC

## 2010-07-06 MED ORDER — ESOMEPRAZOLE MAGNESIUM 40 MG PO CPDR: 40.0000 mg | DELAYED_RELEASE_CAPSULE | Freq: Every day | ORAL | Status: DC

## 2010-07-06 NOTE — Progress Notes (Signed)
Agree with plans for EGD.

## 2010-07-06 NOTE — Progress Notes (Signed)
PT WANTS LIGHT SEDATION PLEASE!!

## 2010-07-06 NOTE — Patient Instructions (Addendum)
PT GIVEN DC INSTRUCTIONS FOR SAFETY PRECAUTIONS TODAY. GIVEN COPY OF PROCEDURE REPORT WITH DR.PERRYS RECOMMENDATIONS . NEXIUM 40 MG DAILY SENT TO [facility] [address]

## 2010-07-07 ENCOUNTER — Telehealth: Payer: Self-pay | Admitting: *Deleted

## 2010-07-07 NOTE — Telephone Encounter (Signed)
No answer at home or cell #.  No ID.  No message left.

## 2010-07-09 ENCOUNTER — Other Ambulatory Visit (INDEPENDENT_AMBULATORY_CARE_PROVIDER_SITE_OTHER): Payer: PRIVATE HEALTH INSURANCE

## 2010-07-09 ENCOUNTER — Ambulatory Visit (INDEPENDENT_AMBULATORY_CARE_PROVIDER_SITE_OTHER): Payer: PRIVATE HEALTH INSURANCE | Admitting: Endocrinology

## 2010-07-09 VITALS — BP 130/78 | HR 82 | Temp 98.3°F | Ht 62.0 in | Wt 108.4 lb

## 2010-07-09 DIAGNOSIS — R002 Palpitations: Secondary | ICD-10-CM

## 2010-07-09 DIAGNOSIS — R739 Hyperglycemia, unspecified: Secondary | ICD-10-CM

## 2010-07-09 DIAGNOSIS — R7309 Other abnormal glucose: Secondary | ICD-10-CM

## 2010-07-09 LAB — TSH: TSH: 0.81 u[IU]/mL (ref 0.35–5.50)

## 2010-07-09 NOTE — Progress Notes (Signed)
  Subjective:    Patient ID: Andrea Bates, female    DOB: 07/10/1945, 65 y.o.   MRN: [id number]  HPI Pt continues to have few years of moderate palpitations in the chest, insomnia, and associated excessive dreaming.  She says the sxs are exacerbated by eating carbohydrates.  no help with xanax.  She says 2 cardiologists told her that her heart was normal.  She has lost a few more lbs.  She also reports she is having postprandial hyperglycemia. Past Medical History  Diagnosis Date  . Tachycardia   . Diverticulosis   . Hiatal hernia   . Unspecified hypothyroidism   . Personal history of colonic polyps   . Personal history of malignant neoplasm of breast   . Hemorrhoids   . Other and unspecified hyperlipidemia   . Palpitations     occasional sinus tachycardia with rates 110-120's. Rare PAC's/PVC's. Serveral short 4-5  . Irritable bowel syndrome   . Hyperthyroidism   . Hyperlipidemia     Is now taking Fish oil. Had myalgias with Vytorin and Lipitor  . GERD (gastroesophageal reflux disease)    Past Surgical History  Procedure Date  . Nasal sinus surgery   . Tonsillectomy   . Carpal tunnel release     bilateral   . Colonoscopy   . Bunionectomy     reports that she has never smoked. She has never used smokeless tobacco. She reports that she does not drink alcohol or use illicit drugs. family history includes Colon cancer in an unspecified family member. No Known Allergies  Review of Systems Denies fever and loc.      Objective:   Physical Exam GENERAL: no distress Neck: i do not appreciate a nodule in the thyroid or elsewhere in the neck LUNGS:  Clear to auscultation HEART:Regular rate and rhythm without murmurs noted. Normal S1,S2.   PSYCH:  Anxious     Lab Results  Component Value Date   TSH 0.81 07/09/2010   T3TOTAL 84.1 05/04/2010   Lab Results  Component Value Date   HGBA1C 5.6 07/09/2010     Assessment & Plan:  Hypothyroidism.  Well-replaced Palpitations,  uncertain etiology Mild hyperglycemia

## 2010-07-09 NOTE — Patient Instructions (Addendum)
Blood and urine tests are being ordered for you today.  please call [phone] to hear each of your test results. (update: i left message on phone-tree:  rx as we discussed)

## 2010-07-13 ENCOUNTER — Other Ambulatory Visit: Payer: Self-pay | Admitting: Internal Medicine

## 2010-07-13 ENCOUNTER — Encounter: Payer: Self-pay | Admitting: Cardiology

## 2010-07-13 ENCOUNTER — Telehealth: Payer: Self-pay | Admitting: Internal Medicine

## 2010-07-13 ENCOUNTER — Telehealth: Payer: Self-pay | Admitting: Endocrinology

## 2010-07-13 NOTE — Telephone Encounter (Signed)
Set her up for a solid-phase gastric emptying scan to rule out gastroparesis

## 2010-07-13 NOTE — Telephone Encounter (Signed)
Pt called stating that after having blueberry pancakes with syrup her blood sugars rose to 246 1 hour later. Pt states 2 hours after that her blood sugars return to normal at 105. Pt is requesting to know the significance of this per discussion at OV. Pt says she does not want to start any medications for this and believes she can control with her diet. Please advise

## 2010-07-13 NOTE — Telephone Encounter (Signed)
elev sugar today, pt advised to call immediately if sugar over 200. Pt would like to know how to handle.

## 2010-07-13 NOTE — Telephone Encounter (Signed)
Pt states that she is still having problems with her stomach, her stomach hurts and it feels like the food is just sitting there. She feels a lot of pressure in her stomach after she eats. Pt states that the nurse practitioner had mentioned a study that could be done to see how long the food was staying in her stomach. Pt would like to have this done. Dr. Marina Goodell please advise.

## 2010-07-13 NOTE — Telephone Encounter (Signed)
Pt scheduled for GES at [facility] for May 3rd arrival time at 12:45pm, scan at 1pm. Pt to be off stomach meds 24 hours prior to exam and be NPO after 5am. Pt aware of instructions and appt date and time.

## 2010-07-13 NOTE — Telephone Encounter (Signed)
i called pt 07/13/10.  Options to reduce your risk of dm are acarbose, actos, and metformin.

## 2010-07-14 ENCOUNTER — Encounter: Payer: Self-pay | Admitting: Cardiology

## 2010-07-14 ENCOUNTER — Ambulatory Visit (INDEPENDENT_AMBULATORY_CARE_PROVIDER_SITE_OTHER): Payer: PRIVATE HEALTH INSURANCE | Admitting: Cardiology

## 2010-07-14 DIAGNOSIS — R0681 Apnea, not elsewhere classified: Secondary | ICD-10-CM

## 2010-07-14 DIAGNOSIS — R002 Palpitations: Secondary | ICD-10-CM

## 2010-07-14 DIAGNOSIS — Z0189 Encounter for other specified special examinations: Secondary | ICD-10-CM

## 2010-07-14 MED ORDER — CITALOPRAM HYDROBROMIDE 20 MG PO TABS: 20.0000 mg | ORAL_TABLET | Freq: Every day | ORAL | Status: DC

## 2010-07-14 NOTE — Patient Instructions (Signed)
Start Celexa 20mg  daily.  Dr Shirlee Latch has referred you to Dr Vassie Loll in pulmonary for further evaluation for sleep apnea.  Your physician wants you to follow-up in: 6 months with Dr Shirlee Latch.(October 2012). You will receive a reminder letter in the mail two months in advance. If you don't receive a letter, please call our office to schedule the follow-up appointment.

## 2010-07-15 NOTE — Assessment & Plan Note (Signed)
Patient feels her heart race and skip.  Event monitor was fairly unremarkable with a few PACs and PVCs and a couple of 4 beat runs of probable atrial tachycardia.  She did have some sinus tachycardia on the monitor.  This was fairly mild.  Her symptoms are quite bothersome.  I think that there a large component of anxiety driving her symptoms.  She certainly may also have a component of inappropriate sinus tachycardia.  She has not tolerated beta blockers or diltiazem.  She is now using a very low dose of diltiazem prn.  I am going to have her start Celexa 20 mg daily to treat her generalized anxiety and to see if this helps her palpitations.  I will see her back in 6 months.

## 2010-07-15 NOTE — Progress Notes (Signed)
PCP: Dr. Simone Curia  Andrea Bates is a 65 yo female with h/o hyperlipidemia and family history of early CAD who is seen in followup today.  She was admitted to [facility] in 2/12 with atypical chest pain.  She had had a negative myoview at [facility] in 2011 but continued to have chest pain. She underwent cardiac catheterization during her recent admission which showed normal coronaries and normal LV systolic function.    She continues to feel her heart race and skip.  She gets periodic lightheaded spells though all blood pressure readings from home are within normal limits (she checks her BP frequently).  She wore a 3 week event monitor which showed rare PVCs and PACs.  She had some sinus tachycardia with rates to the 110s - 120s on occasion.  She had a couple of 4 beat runs of probable atrial tachycardia.  She activated the monitor when she felt lightheaded, but rhythm was unremarkable at those times.  She had a sleep study that was normal.  She had thyroid tests done that were normal and has been seen by Dr. Everardo All for endocrine evaluation with no significant abnormalities found.  I had her take diltiazem, which she initially tolerated but then stopped when she felt squeezing in her chest.  She is now using 1/2 30 mg diltiazem pill prn palpitations.  She does not want to re-try a beta blocker given a severe presyncopal episode when she tried atenolol.  She has a lot of anxiety surrounding her heart rate.  She notes her palpitations now mainly at night.  She feels like her sleep is "not right."  Her husband thinks she stops breathing at night and they are convinced that she was unable to sleep enough the night of her sleep study to get an accurate test.  They want her to be re-tested.    Labs (2/12): TSH, free T4, free T3 all normal.  K 3.9, creatinine 0.62.  Labs (3/12): K 4.8, creatinine 0.6, Mg 2.5, urine catecholeamines normal  Allergies (verified):  1)  * Shellfish  Past Medical  History: 1.  Hyperlipidemia:  she is now taking fish oil.  She had myalgias with Vytorin and Lipitor.  2.  Hypothyroidism. 3.  Diverticulosis.  4.  Anxiety.  5.  IBS 6.  Colonic polyps. ADENOMATOUS-LAST COLON 11/2007  7.  GERD 8.  Chest pain: Myoview at [facility] in 2011 was normal.  LHC (2/12) showed normal coronaries, EF 65%. 9.  Echo (2/10): EF 55-60%, grade I diastolic dysfunction 10.  Palpitations: 3 week event monitor (2/12) with occasional sinus tachycardia with rates 110s-120s.  Rare PACs/PVCs.  Several short 4-5 beat runs of atrial tachycardia.  11.  Sleep study normal 2/12  Family History: Father with h/o 2 heart valve replacements.  He also had a stroke.  Brother with MI at age 45.   Family History of Colon Cancer:Great Uncle on Paternal Side  grandother had a goiter.   Social History: Lives with husband in [place].  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  No ETOH.    Review of Systems        All systems reviewed and negative except as per HPI.   Current Outpatient Prescriptions  Medication Sig Dispense Refill  . Calcium Carbonate-Vitamin D (CALCIUM + D PO) Take 1 tablet by mouth daily.       . Cyanocobalamin (VITAMIN B 12 PO) Take 1 tablet by mouth daily.       Marland Kitchen  esomeprazole (NEXIUM) 40 MG capsule Take 1 capsule (40 mg total) by mouth daily.  30 capsule  6  . fish oil-omega-3 fatty acids 1000 MG capsule Take 2 g by mouth daily.        Marland Kitchen levothyroxine (SYNTHROID) 75 MCG tablet Take 75 mcg by mouth daily.        . citalopram (CELEXA) 20 MG tablet Take 1 tablet (20 mg total) by mouth daily.  30 tablet  6   Current Facility-Administered Medications  Medication Dose Route Frequency Provider Last Rate Last Dose  . DISCONTD: 0.9 %  sodium chloride infusion  500 mL Intravenous Continuous Yancey Flemings, MD        BP 124/76  Pulse 79  Ht 5\' 2"  (1.575 m)  Wt 106 lb 12.8 oz (48.444 kg)  BMI 19.53 kg/m2 General:  Well developed, well nourished, in no acute  distress. Neck:  Neck supple, no JVD. No masses, thyromegaly or abnormal cervical nodes. Lungs:  Clear bilaterally to auscultation and percussion. Heart:  Non-displaced PMI, chest non-tender; regular rate and rhythm, S1, S2 without murmurs, rubs or gallops. Carotid upstroke normal, no bruit. Pedals normal pulses. No edema, no varicosities. Abdomen:  Bowel sounds positive; abdomen soft and non-tender without masses, organomegaly, or hernias noted. No hepatosplenomegaly. Extremities:  No clubbing or cyanosis. Neurologic:  Alert and oriented x 3. Psych:  Normal affect.

## 2010-07-15 NOTE — Assessment & Plan Note (Signed)
Patient had a negative sleep study but she and her husband are worried that she did not sleep enough that night to get an accurate test.  She wants to be retested.  Her husband thinks she stops breathing at night.  A repeat test with a home sleep study may be reasonable.  However, I am going to refer her to see a sleep specialist to determine any further workup.

## 2010-07-16 ENCOUNTER — Other Ambulatory Visit: Payer: PRIVATE HEALTH INSURANCE

## 2010-07-16 DIAGNOSIS — R002 Palpitations: Secondary | ICD-10-CM

## 2010-07-16 NOTE — Telephone Encounter (Signed)
[phone]- cell phone. Discuss new medication .

## 2010-07-16 NOTE — Telephone Encounter (Signed)
I talked with pt by telephone. Pt took first dose of Celexa last night. Pt states she went to bed about 10-10:30pm. She woke up about 1:30am. She had spasms in her throat  and chest and felt like her esophagus was closing. She felt like she was suffocating.  She states her heart rate dropped into the 60's which is very unusual for her and her diastolic BP was in the 60's. She does have reflux but has never had symptoms this bad. She states she did not eat anything spicy and did not eat right before bedtime. She states her heart rate and BP are better today and in general she is feeling better.  She is still sore in her chest today because of the severity of her spasms. She is asking if this is a common side effect from Celexa. I will review with dr Shirlee Latch.

## 2010-07-16 NOTE — Telephone Encounter (Signed)
I reviewed with Dr Shirlee Latch. He felt like pt's symptoms could be related to Celexa although it would be an uncommon side effect. He recommended pt try one more time. If pt has similar side effects after one more dose she should D/C Celexa. Pt states she is willing to try 1/3 to 1/2 pill tonight. She will stop Celexa if she has similar side effects.

## 2010-07-20 LAB — CATECHOLAMINES, FRACTIONATED, URINE, 24 HOUR
Epinephrine, 24 hr Urine: 6 mcg/24 h (ref 2–24)
Norepinephrine, 24 hr Ur: 22 mcg/24 h (ref 15–100)
Total Volume - CF 24Hr U: 1700 mL

## 2010-07-20 LAB — METANEPHRINES, URINE, 24 HOUR: Metaneph Total, Ur: 205 mcg/24 h — ABNORMAL LOW (ref 224–832)

## 2010-07-23 ENCOUNTER — Encounter: Payer: Self-pay | Admitting: Cardiology

## 2010-07-23 ENCOUNTER — Encounter (HOSPITAL_COMMUNITY): Payer: Self-pay

## 2010-07-23 ENCOUNTER — Encounter (HOSPITAL_COMMUNITY)
Admission: RE | Admit: 2010-07-23 | Discharge: 2010-07-23 | Disposition: A | Discharge status: Home / Self Care | Payer: PRIVATE HEALTH INSURANCE | Source: Ambulatory Visit | Attending: Internal Medicine | Admitting: Internal Medicine

## 2010-07-23 DIAGNOSIS — R109 Unspecified abdominal pain: Secondary | ICD-10-CM | POA: Insufficient documentation

## 2010-07-23 MED ORDER — TECHNETIUM TC 99M SULFUR COLLOID
2.0000 | Freq: Once | INTRAVENOUS | Status: AC | PRN
  Administered 2010-07-23: 2 via INTRAVENOUS

## 2010-07-23 NOTE — Telephone Encounter (Signed)
Error

## 2010-07-24 ENCOUNTER — Ambulatory Visit (INDEPENDENT_AMBULATORY_CARE_PROVIDER_SITE_OTHER): Payer: PRIVATE HEALTH INSURANCE | Admitting: Cardiology

## 2010-07-24 ENCOUNTER — Encounter: Payer: Self-pay | Admitting: Cardiology

## 2010-07-24 VITALS — BP 110/70 | HR 85 | Ht 62.0 in | Wt 103.0 lb

## 2010-07-24 DIAGNOSIS — R0681 Apnea, not elsewhere classified: Secondary | ICD-10-CM

## 2010-07-24 DIAGNOSIS — R002 Palpitations: Secondary | ICD-10-CM

## 2010-07-24 DIAGNOSIS — R000 Tachycardia, unspecified: Secondary | ICD-10-CM

## 2010-07-24 MED ORDER — PROPRANOLOL HCL 10 MG PO TABS: 10.0000 mg | ORAL_TABLET | Freq: Two times a day (BID) | ORAL | Status: DC

## 2010-07-24 NOTE — Patient Instructions (Signed)
Take Propranolol 10mg  twice a day.  Your physician wants you to follow-up in: 6 months with Dr Shirlee Latch. (November 2012).You will receive a reminder letter in the mail two months in advance. If you don't receive a letter, please call our office to schedule the follow-up appointment.

## 2010-07-24 NOTE — Assessment & Plan Note (Signed)
Patient returns because she did not tolerate Celexa.  She continues to feel her heart rate increase to 90-100 bpm at seemingly random times but often after meals.  Taking PPI has not helped.   Event monitor was fairly unremarkable with a few PACs and PVCs and a couple of 4 beat runs of probable atrial tachycardia.  She did have some sinus tachycardia on the monitor.  This was fairly mild.  Her symptoms are quite bothersome.  I think that there a large component of anxiety driving her symptoms but as above, she does not tolerate Celexa.  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  She has not tolerated diltiazem.  I am going to have her try a very low dose of propranolol to see if she can tolerate this.

## 2010-07-24 NOTE — Progress Notes (Deleted)
   Patient ID: Andrea Bates, female    DOB: 11-07-1945, 65 y.o.   MRN: [id number]  HPI    Review of Systems    Physical Exam

## 2010-07-24 NOTE — Assessment & Plan Note (Signed)
Patient had a negative sleep study but she and her husband are worried that she did not sleep enough that night to get an accurate test.  She wants to be retested.  Her husband thinks she stops breathing at night.  A repeat test with a home sleep study may be reasonable.  She has an appointment with pulmonary.

## 2010-07-24 NOTE — Progress Notes (Signed)
PCP: Dr. Simone Curia   Andrea Bates is a 65 yo female with h/o hyperlipidemia and family history of early CAD who is seen in followup today. She was admitted to [facility] in 2/12 with atypical chest pain. She had had a negative myoview at [facility] in 2011 but continued to have chest pain. She underwent cardiac catheterization during her recent admission which showed normal coronaries and normal LV systolic function.   She continues to feel her heart race and skip, especially after meals.  She wore a 3 week event monitor which showed rare PVCs and PACs. She had some sinus tachycardia with rates to the 110s - 120s on occasion. She had a couple of 4 beat runs of probable atrial tachycardia. She activated the monitor when she felt lightheaded, but rhythm was unremarkable at those times. She had a sleep study that was normal. She had thyroid tests done that were normal and has been seen by Dr. Everardo All for endocrine evaluation with no significant abnormalities found. I had her take diltiazem, which she initially tolerated but then stopped when she felt squeezing in her chest. She is now using 1/2 30 mg diltiazem pill prn palpitations. She had a severe presyncopal episode when she tried atenolol. She has a lot of anxiety surrounding her heart rate, so I had her try Celexa after last appointment.  She says that she got severe chest tightness after starting Celexa so is no longer taking it.  She feels like her sleep is "not right." Her husband thinks she stops breathing at night and they are convinced that she was unable to sleep enough the night of her sleep study to get an accurate test. They want her to be re-tested, so I have set her up to see pulmonology for a sleep evaluation.  Labs (2/12): TSH, free T4, free T3 all normal. K 3.9, creatinine 0.62.  Labs (3/12): K 4.8, creatinine 0.6, Mg 2.5, urine catecholeamines normal   Allergies (verified):  1) * Shellfish   Past Medical History:  1.  Hyperlipidemia: she is now taking fish oil. She had myalgias with Vytorin and Lipitor.  2. Hypothyroidism.  3. Diverticulosis.  4. Anxiety.  5. IBS  6. Colonic polyps. ADENOMATOUS-LAST COLON 11/2007  7. GERD, esophagitis on EGD 8. Chest pain: Myoview at [facility] in 2011 was normal. LHC (2/12) showed normal coronaries, EF 65%.  9. Echo (2/10): EF 55-60%, grade I diastolic dysfunction  10. Palpitations: 3 week event monitor (2/12) with occasional sinus tachycardia with rates 110s-120s. Rare PACs/PVCs. Several short 4-5 beat runs of atrial tachycardia.  11. Sleep study normal 2/12   Family History:  Father with h/o 2 heart valve replacements. He also had a stroke. Brother with MI at age 64.  Family History of Colon Cancer:Great Uncle on Paternal Side  grandother had a goiter.   Social History:  Lives with husband in [place]. 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 No ETOH.   Review of Systems  All systems reviewed and negative except as per HPI.   Current Outpatient Prescriptions  Medication Sig Dispense Refill  . Calcium Carbonate-Vitamin D (CALCIUM + D PO) Take 1 tablet by mouth daily.       . Cyanocobalamin (VITAMIN B 12 PO) Take 1 tablet by mouth daily.       Marland Kitchen esomeprazole (NEXIUM) 40 MG capsule Take 1 capsule (40 mg total) by mouth daily.  30 capsule  6  . fish oil-omega-3 fatty acids 1000 MG capsule Take  2 g by mouth daily.        Marland Kitchen levothyroxine (SYNTHROID) 75 MCG tablet Take 75 mcg by mouth daily.        . propranolol (INDERAL) 10 MG tablet Take 1 tablet (10 mg total) by mouth 2 (two) times daily.  60 tablet  6  . DISCONTD: citalopram (CELEXA) 20 MG tablet Take 1 tablet (20 mg total) by mouth daily.  30 tablet  6    BP 110/70  Pulse 85  Ht 5\' 2"  (1.575 m)  Wt 103 lb (46.72 kg)  BMI 18.84 kg/m2 General: NAD Neck: No JVD, no thyromegaly or thyroid nodule.  Lungs: Clear to auscultation bilaterally with normal respiratory effort. CV: Nondisplaced PMI.  Heart  regular S1/S2, no S3/S4, no murmur.  No peripheral edema.  No carotid bruit.  Normal pedal pulses.  Abdomen: Soft, nontender, no hepatosplenomegaly, no distention.  Neurologic: Alert and oriented x 3.  Psych: Anxious Extremities: No clubbing or cyanosis.

## 2010-07-27 ENCOUNTER — Telehealth: Payer: Self-pay

## 2010-07-27 NOTE — Telephone Encounter (Signed)
Pt aware of GES results per Dr. Marina Goodell. Pt states she will keep her appt scheduled for 08/03/10 with Dr. Marina Goodell.

## 2010-07-27 NOTE — Telephone Encounter (Signed)
Message copied by Chrystie Nose on Mon Jul 27, 2010  3:49 PM ------      Message from: Yancey Flemings      Created: Mon Jul 27, 2010  2:36 PM       Let pt know that he gastric emptying is within normal limits. No new recommendations

## 2010-08-03 ENCOUNTER — Encounter: Payer: Self-pay | Admitting: Internal Medicine

## 2010-08-03 ENCOUNTER — Ambulatory Visit (INDEPENDENT_AMBULATORY_CARE_PROVIDER_SITE_OTHER): Payer: PRIVATE HEALTH INSURANCE | Admitting: Internal Medicine

## 2010-08-03 DIAGNOSIS — K219 Gastro-esophageal reflux disease without esophagitis: Secondary | ICD-10-CM

## 2010-08-03 DIAGNOSIS — K5900 Constipation, unspecified: Secondary | ICD-10-CM

## 2010-08-03 NOTE — Progress Notes (Signed)
HISTORY OF PRESENT ILLNESS:  Andrea Bates is a 65 y.o. female who presents herself today for followup. She was last seen 07/06/2010 when she underwent upper endoscopy to evaluate globus sensation, dysphagia, and atypical chest pain. Upper endoscopy revealed mild distal esophagitis, but was otherwise normal. She was placed on Nexium 40 mg daily. This seemed to help a periodic epigastric burning, but did not affect other symptoms. She subsequently contacted the office complaining of a sensation that her food would not digest well. A solid phase gastric emptying scan was performed 07/23/2010. This was normal. The patient presents today with a myriad of complaints. She is frustrated that she cannot find a unifying diagnosis. In addition to myself, she has seen several cardiologists and an endocrinologist regarding her complaints. Ongoing GI complaints include globus sensation, intermittent epigastric discomfort, and constipation. For constipation, she takes MiraLax. This helps. GI complaints include fair falling out, skin rash, intermittent tachycardia, fatigue, anxiety, shortness of breath, and sinus allergy troubles. She is visibly upset, and tearful at different points during the interview. She tells me that she was treated with an anxiolytic as well as an antidepressant, but did not tolerate these after a brief trial. She inquired about seeing some type of specialist to sort things out.  REVIEW OF SYSTEMS:  All non-GI ROS negative except for those items listed in the history of present illness  Past Medical History  Diagnosis Date  . Tachycardia   . Diverticulosis   . Hiatal hernia   . Unspecified hypothyroidism   . Personal history of colonic polyps   . Personal history of malignant neoplasm of breast   . Hemorrhoids   . Other and unspecified hyperlipidemia   . Palpitations     occasional sinus tachycardia with rates 110-120's. Rare PAC's/PVC's. Serveral short 4-5  . Irritable bowel  syndrome   . Hyperthyroidism   . Hyperlipidemia     Is now taking Fish oil. Had myalgias with Vytorin and Lipitor  . GERD (gastroesophageal reflux disease)   . Pre-diabetes     Past Surgical History  Procedure Date  . Nasal sinus surgery   . Tonsillectomy   . Carpal tunnel release     bilateral   . Colonoscopy   . Bunionectomy     Social History Andrea Bates  reports that she has never smoked. She has never used smokeless tobacco. She reports that she does not drink alcohol or use illicit drugs.  family history includes Colon cancer in an unspecified family member; Diabetes in her paternal grandmother; and Heart defect in her father.  No Known Allergies     PHYSICAL EXAMINATION:  Vital signs: BP 108/76  Pulse 80  Ht 5\' 2"  (1.575 m)  Wt 104 lb (47.174 kg)  BMI 19.02 kg/m2 General: Well-developed, well-nourished, no acute distress Psychiatric: alert and oriented x3. Cooperative . Anxious   ASSESSMENT:  #1. Mild esophagitis on endoscopy. Epigastric burning reported improved on Nexium #2. Functional constipation #3. Multiple somatic complaints, seemingly related anxiety/depression  PLAN:  #1. Can use Nexium on demand for classic GERD symptoms #2. MiraLax when necessary for constipation #3. Return to the care of her primary provider. I did suggest common that he might refer her to a tertiary care center for an additional opinion. She was opened to this idea #4. GI followup when necessary

## 2010-08-03 NOTE — Patient Instructions (Addendum)
cc: Delrae Rend, MD

## 2010-08-04 NOTE — Assessment & Plan Note (Signed)
[facility]                         GASTROENTEROLOGY OFFICE NOTE   SAHRA, CONVERSE                    MRN:          [id number]  DATE:10/24/2006                            DOB:          Nov 11, 1945    GI URGENT ADD-ON NOTE:   CHIEF COMPLAINT:  Upper and lower abdominal pain.   HISTORY:  Andrea Bates is a pleasant 65 year old white female known to Dr.  Marina Bates, who has had problems with IBS and a persistent globus type of  sensation, which has felt secondary to anxiety.  She has had a  colonoscopy most recently done in July, 2007.  She did have two polyps  removed and is noted to have left-sided diverticular disease, fairly  severe.  She had an EGD last in August, 2006, which was a normal exam.  She says her current symptoms started about three months ago with some  intermittent cramping and spasm in her abdomen, which has gotten  progressively worse over the past couple of months.  She complains of  increased gas and bloating and mucus in her stools.  She is fairly  consistently having discomfort post prandially, which is relieved with  bowel movements.  She gets a hot or burning feeling sometimes in her  abdomen as well.  She is usually having 1-3 bowel movements per day and  has not noted any melena or hematochezia.   On review, she has not started any new medications, supplements, etc.  She has not been on any recent antibiotics.  She states that she did  start drinking a lot of iced tea about two months ago, which she  believes had Splenda in it.  She has stopped this for the time being but  has not as yet noted any change in her symptoms.   CURRENT MEDICATIONS:  Synthroid 50 mcg per day.   ALLERGIES:  No known drug allergies.   PHYSICAL EXAMINATION:  A well-developed white female in no acute  distress.  She is somewhat tearful.  She says she is tired because she  did not sleep last night because of abdominal discomfort.  Weight is 117.   Blood pressure 118/78.  Pulse is 82.  CARDIOVASCULAR:  Regular rate and rhythm with S1 and S2.  No murmur, rub  or gallop.  PULMONARY:  Clear to A&P.  ABDOMEN:  Soft.  Bowel sounds are active.  There is no focal abdominal  tenderness.  No mass or hepatosplenomegaly.  RECTAL:  Not done at this time.   IMPRESSION:  A 65 year old female with intermittent abdominal pain,  somewhat progressive, crampy in nature, with increased gas, bloating,  and frequency of stools.  Symptoms are most consistent with irritable  bowel syndrome, rule out component of bacterial overgrowth.   PLAN:  1. A trial of Robinol Forte 2 mg b.i.d. for cramping and spasms and      Zifaxin 200 mg t.i.d. x7 days.  2. Avoid artificial sweeteners, as it is not clear whether or not she      may have some sensitivity.  3. Follow up with Dr. Marina Bates in 2-3 weeks.  Mike Gip, PA-C  Electronically Signed      Andrea Bates. Andrea Goodell, MD  Electronically Signed   AE/MedQ  DD: 10/25/2006  DT: 10/25/2006  Job #: [id number]

## 2010-08-04 NOTE — Assessment & Plan Note (Signed)
[facility]                         GASTROENTEROLOGY OFFICE NOTE   Andrea Bates, Andrea Bates                    MRN:          [id number]  DATE:11/14/2006                            DOB:          Feb 07, 1946    HISTORY:  Andrea Bates presents today for followup.  She was evaluated October 24, 2006 by Mike Gip, P.A.-C. for somewhat progressive intermittent  abdominal pain.  Features at that time were cramping with increased gas,  bloating, and increased frequency of stools.  She is known to have  irritable bowel syndrome as well as multiple somatic complaints without  organic basis.  She was felt to have irritable bowel with possible  bacterial overgrowth.  She was treated with Robinul Forte as well as  Xifaxan 200 mg t.i.d. for 1 week.  She did not tolerate Robinul Forte,  stating dry eyes.  She did complete Xifaxan, and she felt this was  somewhat helpful.  She still continues with some abdominal pain and  abdominal complaints.  Despite complaining of similar problems in the  past, she states this is clearly different.  She states pain is  currently favoring the left side.  She describes several bowel movements  per day.  She wonders about gallbladder disease.   MEDICATIONS:  Her only medication is Synthroid.   PHYSICAL EXAMINATION:  GENERAL:  Well-appearing female in no acute  distress.  VITAL SIGNS:  Blood pressure 126/82, heart rate 76, weight 116 pounds.  HEENT:  Sclerae anicteric.  Conjunctivae pink.  Oral mucosa is intact.  LUNGS:  Clear.  HEART:  Regular.  ABDOMEN:  Soft without tenderness, mass, or hernia.  Good bowel sounds  heard.   IMPRESSION:  Problems with abdominal pain and loose stools consistent  with irritable bowel syndrome.  The patient does not feel her problems  represent irritable bowel.  Given ongoing complaints, additional workup  will be pursued.   RECOMMENDATIONS:  1. CBC, comprehensive metabolic panel, C-reactive protein,  lipase, and      urinalysis.  2. Schedule abdominal ultrasound.  3. Empiric treatment with the probiotic Align 1 daily for 4 weeks.  4. Office followup in about 6 weeks.     Wilhemina Bonito. Marina Goodell, MD  Electronically Signed    JNP/MedQ  DD: 11/14/2006  DT: 11/15/2006  Job #: [id number]   cc:   Lucky Cowboy, M.D.

## 2010-08-07 ENCOUNTER — Ambulatory Visit: Payer: PRIVATE HEALTH INSURANCE | Admitting: Cardiology

## 2010-08-07 NOTE — Op Note (Signed)
NAME:  Andrea Bates, Andrea Bates             ACCOUNT NO.:  [id number]   MEDICAL RECORD NO.:  [id number]          PATIENT TYPE:  AMB   LOCATION:  [facility]C                           FACILITY:  [facility]   PHYSICIAN:  Duke Salvia. Marcelle Overlie, M.D.DATE OF BIRTH:  09-03-1945   DATE OF PROCEDURE:  08/28/2004  DATE OF DISCHARGE:                                 OPERATIVE REPORT   PREOPERATIVE DIAGNOSIS:  Abdominal and pelvic pain.   POSTOPERATIVE DIAGNOSIS:  Abdominal and pelvic pain.   PROCEDURE:  Diagnostic laparoscopy.   SURGEON:  Duke Salvia. Marcelle Overlie, M.D.   ANESTHESIA:  General endotracheal.   COMPLICATIONS:  None.   DRAINS:  In-and-out Foley catheter.   ESTIMATED BLOOD LOSS:  Minimal.   PROCEDURE AND FINDINGS:  The patient was seen in the operating room.  After  an adequate level of general endotracheal anesthesia was obtained with the  patient's legs in stirrups, the abdomen, perineum and vagina were prepared  and draped in the usual manner for laparoscopy.  The bladder was drained,  EUA carried out.  The uterus was small, midposition, adnexa negative.  A  small Kahn cannula was positioned to manipulate the uterus.  Attention  directed to the abdomen, where the subumbilical area was infiltrated with  0.5% Marcaine plain.  A small incision was made and the Veress needle was  introduced without difficulty.  Its intra-abdominal position was verified by  pressure and water testing.  After a 2.5 L pneumoperitoneum was then  created, laparoscopic trocar and sleeve were then introduced without  difficulty.  There was no evidence of any bleeding or trauma.  Three  fingerbreadths above the midline, a 5 mm trocar was inserted under direct  visualization.  The patient was  then placed in Trendelenburg and  laparoscopy carried out as follows:   A systematic review of the pelvic anatomy was carried out anteriorly, and  the posterior cul-de-sac spaces were unremarkable.  The uterosacral  ligaments were not  well-defined at all.  The uterus itself was small.  The  serosa was unremarkable.  There was no evidence of any early-stage  endometriosis in the anterior or posterior space.  Bilateral tubes and  ovaries were normal, atrophic-appearing ovaries with no abnormalities.  There was no free fluid noted.  The pelvic sidewall on either side,  including the course of the pelvic ureter, was unremarkable.  The appendix  appeared to be normal.  In the area of the cecum and right lateral gutter  there was some omental scarring, but this  was nonspecific.  The liver edge and gallbladder were otherwise  unremarkable.  These areas were photo documented.  Instruments were removed,  gas allowed to escape.  The defects closed with 4-0 Dexon subcuticular  sutures and Dermabond.  She tolerated this well and went to the recovery  room in good condition.       RMH/MEDQ  D:  08/28/2004  T:  08/28/2004  Job:  [id number]

## 2010-08-07 NOTE — H&P (Signed)
NAME:  Andrea Bates, Andrea Bates             ACCOUNT NO.:  [id number]   MEDICAL RECORD NO.:  [id number]           PATIENT TYPE:   LOCATION:                                 FACILITY:   PHYSICIAN:  Duke Salvia. Marcelle Overlie, M.D.DATE OF BIRTH:  09/16/1945   DATE OF ADMISSION:  08/28/2004  DATE OF DISCHARGE:                                HISTORY & PHYSICAL   CHIEF COMPLAINT:  Pelvic pain.   HISTORY OF PRESENT ILLNESS:  A 65 year old postmenopausal patient, G3, P2,  who has had a several year history of vague abdominopelvic pain.  In 2004,  she had an abdominopelvic CT that was normal.  Due to continued pelvic pain  complaints, pelvic ultrasound done in our office May 21, 2004 was normal.  She was seen by Dr. Vernie Ammons for evaluation of IC with a negative evaluation,  and also had a follow up outside CT that returned normal earlier this year.  Additionally, Dr. Yancey Flemings did a colonoscopy dated Jul 23, 2004 that was  normal.  She continues to have vague mid pelvic pain, still very concerned,  and prefers to have definitive laparoscopy.  This procedure, including risk  of bleeding, infection, adjacent organ injury, the possible need for open or  additional surgery, were all reviewed with her, which she understands and  accepts.   PAST MEDICAL HISTORY:   ALLERGIES:  None.   CURRENT MEDICATIONS:  1.  Nasonex.  2.  Nexium.  (She had been on Evista for osteoporosis, but she stopped this in 2005  because it worsened her vasomotor symptoms.)   She has had 2 vaginal deliveries, one miscarriage.   FAMILY HISTORY:  Significant for father with heart disease, an aunt who had  breast cancer.  Otherwise, unremarkable.   PHYSICAL EXAMINATION:  VITAL SIGNS:  Temperature 98.2, blood pressure  120/72.  HEENT:  Unremarkable.  NECK:  Supple without masses.  LUNGS:  Clear.  CARDIOVASCULAR:  Regular rate and rhythm without murmurs, rubs, or gallops  noted.  BREASTS:  Without masses.  ABDOMEN:  Soft, flat,  nontender.  PELVIC:  Normal external genitalia.  Vaginal and cervix clear.  Uterus is  mid to posterior, normal size.  Mild uterine prolapse noted.  Adnexa  unremarkable.  RECTOVAGINAL:  Confirms.  EXTREMITIES:  Neurologic exam unremarkable.   IMPRESSION:  Chronic pelvic/abdominal pain; evaluation as above.  Will  proceed with diagnostic laparoscopy.  Procedure and risks reviewed as above.       RMH/MEDQ  D:  08/26/2004  T:  08/26/2004  Job:  [id number]

## 2010-08-13 ENCOUNTER — Institutional Professional Consult (permissible substitution): Payer: PRIVATE HEALTH INSURANCE | Admitting: Pulmonary Disease

## 2010-08-13 ENCOUNTER — Telehealth: Payer: Self-pay | Admitting: *Deleted

## 2010-08-13 NOTE — Telephone Encounter (Signed)
Left message for pt to callback office regarding results of 24-hr urine test from last month.

## 2010-08-13 NOTE — Telephone Encounter (Signed)
Pt informed of lab results. 

## 2010-08-13 NOTE — Telephone Encounter (Signed)
Message copied by Brenton Grills on Thu Aug 13, 2010  4:23 PM ------      Message from: Cristy Hilts F      Created: Thu Aug 13, 2010  1:25 PM                   ----- Message -----         From: Sheffield Slider, RN         Sent: 08/03/2010  11:39 AM           To: Jacelyn Pi, RN                        ----- Message -----         From: Minus Breeding, MD         Sent: 07/21/2010   6:40 AM           To: Doristine Devoid, CMA            please leave message on phone tree--normal

## 2010-08-13 NOTE — Progress Notes (Signed)
Left message on machine for pt to return my call  

## 2010-09-07 ENCOUNTER — Institutional Professional Consult (permissible substitution): Payer: PRIVATE HEALTH INSURANCE | Admitting: Pulmonary Disease

## 2010-10-08 ENCOUNTER — Institutional Professional Consult (permissible substitution): Payer: PRIVATE HEALTH INSURANCE | Admitting: Pulmonary Disease

## 2010-11-06 ENCOUNTER — Institutional Professional Consult (permissible substitution): Payer: PRIVATE HEALTH INSURANCE | Admitting: Pulmonary Disease

## 2011-08-10 ENCOUNTER — Ambulatory Visit: Payer: PRIVATE HEALTH INSURANCE | Admitting: Nurse Practitioner

## 2011-08-10 ENCOUNTER — Encounter: Payer: Self-pay | Admitting: Nurse Practitioner

## 2011-08-10 ENCOUNTER — Ambulatory Visit (INDEPENDENT_AMBULATORY_CARE_PROVIDER_SITE_OTHER): Payer: PRIVATE HEALTH INSURANCE | Admitting: Nurse Practitioner

## 2011-08-10 VITALS — BP 130/68 | HR 74 | Ht 62.0 in | Wt 103.0 lb

## 2011-08-10 DIAGNOSIS — R002 Palpitations: Secondary | ICD-10-CM

## 2011-08-10 MED ORDER — ESOMEPRAZOLE MAGNESIUM 40 MG PO CPDR: 40.0000 mg | DELAYED_RELEASE_CAPSULE | ORAL | Status: DC | PRN

## 2011-08-10 NOTE — Patient Instructions (Signed)
Your physician recommends that you schedule a follow-up appointment in: 1 month with Dr Shirlee Latch  Your physician has recommended that you wear a holter monitor. Holter monitors are medical devices that record the heart's electrical activity. Doctors most often use these monitors to diagnose arrhythmias. Arrhythmias are problems with the speed or rhythm of the heartbeat. The monitor is a small, portable device. You can wear one while you do your normal daily activities. This is usually used to diagnose what is causing palpitations/syncope (passing out).

## 2011-08-10 NOTE — Progress Notes (Signed)
Patient Name: Andrea Bates Date of Encounter: 08/10/2011  Primary Care Provider:  Simone Curia, MD, MD Primary Cardiologist:  Golden Circle, MD  Patient Profile  66 year old female with history of palpitations who presents with recurrent palpitations.  Problem List   Past Medical History  Diagnosis Date  . Diverticulosis   . Hiatal hernia   . Hypothyroidism   . Personal history of colonic polyps     a. colonoscopy 2009  . Personal history of malignant neoplasm of breast   . Hemorrhoids   . Hyperlipidemia   . Palpitations     a. 04/2008 echo: EF 50-60%;  b. 21 day event monitor 2012 - occasional sinus tachycardia with rates 110-120's. Rare PAC's/PVC's. Serveral short 4-5 beat runs of a-tach.  . Irritable bowel syndrome   . Hyperlipidemia     Is now taking Fish oil. Had myalgias with Vytorin and Lipitor  . GERD (gastroesophageal reflux disease)     a. mild esophagitis on egd 2012  . Pre-diabetes   . Chest pain     a. negative myoview 2011 [facility]);  b. 04/2010 Cath: normal cors   Past Surgical History  Procedure Date  . Nasal sinus surgery   . Tonsillectomy   . Carpal tunnel release     bilateral   . Colonoscopy   . Bunionectomy     Allergies  Allergies  Allergen Reactions  . Armour Thyroid (Thyroid)     Palpitations   . Atenolol     presyncope  . Celexa (Citalopram Hydrobromide)     Severe chest tightness   . Diltiazem Hcl     Dropped bp  . Lipitor (Atorvastatin)     Myalgias   . Propranolol Hcl     Felt bad  . Vytorin (Ezetimibe-Simvastatin)     Myalgias     HPI  66 year old female with the above problem list.  In 2012, she underwent extensive workup for palpitations chest pain and dyspnea including diagnostic catheterization which was normal, echocardiogram which was normal, and 21 day event monitor which revealed PACs, PVCs, and brief episodes of PAT.  She was last seen by Dr. Jearld Pies in May of 2012.  She reports that she was doing well after  that last visit, with significantly reduced palpitations.  She had not been taking any medications having been intolerant to atenolol, propranolol, and diltiazem.  In September of last year, she went off of Synthroid in favor of a bioidentical of drug and at that point began to experience more frequent palpitations.  About 3 months ago this was discontinued in favor of Armour Thyroid however dictation skipped beats became more frequent on that drug and therefore about 2 weeks ago this was changed to Synthroid.  Back on Synthroid, she has had some reduction in palpitations though they are still bothersome.  Her palpitations are described as skipped or extra beats.  She often notes that when lying in bed at night and has even awakened in the morning as result of that.  She occasionally drinks as lightheaded or dizziness during frequent palpitations.  She has never passed out.  She has not had chest pain or dyspnea.  Home Medications  Prior to Admission medications   Medication Sig Start Date End Date Taking? Authorizing Provider  Calcium Carbonate-Vitamin D (CALCIUM + D PO) Take 1 tablet by mouth daily.    Yes Historical Provider, MD  cholecalciferol (VITAMIN D) 1000 UNITS tablet Take 50,000 Units by mouth daily.     Yes Historical  Provider, MD  Cyanocobalamin (VITAMIN B 12 PO) Take 1 tablet by mouth daily.    Yes Historical Provider, MD  esomeprazole (NEXIUM) 40 MG capsule Take 1 capsule (40 mg total) by mouth as needed. 08/10/11  Yes Ok Anis, NP  fish oil-omega-3 fatty acids 1000 MG capsule Take 2 g by mouth daily.     Yes Historical Provider, MD  Flaxseed, Linseed, (FLAX SEED OIL) 1000 MG CAPS Take by mouth daily.     Yes Historical Provider, MD  fluticasone (FLONASE) 50 MCG/ACT nasal spray Place 2 sprays into the nose daily.   Yes Historical Provider, MD  Levothyroxine Sodium (SYNTHROID PO) Take 60 mcg by mouth daily.   Yes Historical Provider, MD   Review of Systems She's had  palpitations as outlined above.  She also notes a cough that sometimes occurs in the setting of palpitations.  She denies chest pain, dyspnea, PND, orthopnea, dizziness, syncope, or edema.  She is quite anxious regarding her palpitations.  Otherwise all systems reviewed and negative.  Physical Exam  Blood pressure 130/68, pulse 74, height 5\' 2"  (1.575 m), weight 103 lb (46.72 kg).  General: Pleasant, NAD Psych: Normal affect. Neuro: Alert and oriented X 3. Moves all extremities spontaneously. HEENT: Normal  Neck: Supple without bruits or JVD. Lungs:  Resp regular and unlabored, CTA. Heart: RRR no s3, s4, or murmurs. Abdomen: Soft, non-tender, non-distended, BS + x 4.  Extremities: No clubbing, cyanosis or edema. DP/PT/Radials 2+ and equal bilaterally.  Accessory Clinical Findings  ECG - regular sinus rhythm, 74, no acute ST or T changes.  Assessment & Plan  1.  Palpitations:  Patient has history of palpitations with previously documented PACs, PVCs, and brief runs of atrial tachycardia.  She says that when she last weren't monitor it was the tachycardia that bothered her most at this point if the skipping of her heart.  She would prefer to avoid taking any medications such as beta blockers or calcium channel blockers because of the way the patient feel the past and because she is concerned that her blood pressure will drop.  She simply seeking reassurance.  She is interested in wearing a 48-hour Holter monitor to re-identify her arrhythmia and to assess her arrhythmic burden.   She already had labs drawn by her PCP and she was told that these were normal.  Provided that monitoring shows benign arrhythmia, would not pursue additional evaluation.  Nicolasa Ducking, NP 08/10/2011, 3:45 PM

## 2011-08-11 ENCOUNTER — Telehealth: Payer: Self-pay

## 2011-08-12 NOTE — Telephone Encounter (Signed)
Patient call back to make appt. For 08/18/11

## 2011-08-31 ENCOUNTER — Encounter (INDEPENDENT_AMBULATORY_CARE_PROVIDER_SITE_OTHER): Payer: PRIVATE HEALTH INSURANCE

## 2011-08-31 DIAGNOSIS — R002 Palpitations: Secondary | ICD-10-CM

## 2011-09-13 ENCOUNTER — Telehealth: Payer: Self-pay | Admitting: Cardiology

## 2011-09-13 NOTE — Telephone Encounter (Signed)
New Problem:    Patient called in because on the instruction sheet you gave her she was instructed to mail her monitor in on 09/21/11, but, [employer] called today asking her to mail her device in today.  Please call back.

## 2011-09-20 ENCOUNTER — Ambulatory Visit (INDEPENDENT_AMBULATORY_CARE_PROVIDER_SITE_OTHER): Payer: PRIVATE HEALTH INSURANCE | Admitting: Cardiology

## 2011-09-20 ENCOUNTER — Encounter: Payer: Self-pay | Admitting: Cardiology

## 2011-09-20 VITALS — BP 120/78 | HR 80 | Ht 62.0 in | Wt 104.0 lb

## 2011-09-20 DIAGNOSIS — R002 Palpitations: Secondary | ICD-10-CM

## 2011-09-20 MED ORDER — IPRATROPIUM BROMIDE 0.03 % NA SOLN: 2.0000 | Freq: Two times a day (BID) | NASAL | Status: DC

## 2011-09-20 NOTE — Patient Instructions (Addendum)
Avoid using sudafed.  Use atrovent nasal spray-Use can use 2 sprays each nostril twice a day as needed for nasal congestion.   You do not need to schedule a follow-up appointment with Dr Shirlee Latch.

## 2011-09-21 NOTE — Progress Notes (Signed)
Patient ID: Andrea Bates, female   DOB: 1946/02/18, 66 y.o.   MRN: [id number] PCP: Dr. Simone Curia   Andrea Bates is a 66 yo female with h/o palpitations, hyperlipidemia, and family history of early CAD who is seen in followup today. She underwent cardiac catheterization in 2/12 which showed normal coronaries and normal LV systolic function.  She has had a lot of problems with symptomatic PACs and PVCs in the past.  She has been intolerant of both beta blockers and calcium channel blockers.   Since I last saw her in 5/12, she had been doing well with minimal palpitations.  However, a couple of months ago, her thyroid medication was changed.  She then started to get an increased sensation of her heart "skipping."  She also would note increased skipping whenever she would use nasal decongestants (has allergic rhinitis).  She saw Ward Givens in this office and wore a 3 week event monitor in 6/13.  This showed occasional PVCs and PACs.   Her thyroid medication was actually changed back to levothyroxine and the frequent palpitations have now resolved.    Labs (2/12): TSH, free T4, free T3 all normal. K 3.9, creatinine 0.62.  Labs (3/12): K 4.8, creatinine 0.6, Mg 2.5, urine catecholeamines normal   Allergies (verified):  1) * Shellfish   Past Medical History:  1. Hyperlipidemia: she is now taking fish oil. She had myalgias with Vytorin and Lipitor.  2. Hypothyroidism.  3. Diverticulosis.  4. Anxiety.  5. IBS  6. Colonic polyps. ADENOMATOUS-LAST COLON 11/2007  7. GERD, esophagitis on EGD 8. Chest pain: Myoview at [facility] in 2011 was normal. LHC (2/12) showed normal coronaries, EF 65%.  9. Echo (2/10): EF 55-60%, grade I diastolic dysfunction  10. Palpitations: 3 week event monitor (2/12) with occasional sinus tachycardia with rates 110s-120s. Rare PACs/PVCs. Several short 4-5 beat runs of atrial tachycardia.  3-week event monitor in 6/13 showed occasioanl PVCs and PACs.  11. Sleep  study normal 2/12  12. Allergic rhinitis  Family History:  Father with h/o 2 heart valve replacements. He also had a stroke. Brother with MI at age 83.  Family History of Colon Cancer:Great Uncle on Paternal Side  grandother had a goiter.   Social History:  Lives with husband in [place]. 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 No ETOH.    Current Outpatient Prescriptions  Medication Sig Dispense Refill  . Calcium Carbonate-Vitamin D (CALCIUM + D PO) Take 1 tablet by mouth daily.       . cholecalciferol (VITAMIN D) 1000 UNITS tablet Take 4,000 Units by mouth daily.       . Cyanocobalamin (VITAMIN B 12 PO) Take 1 tablet by mouth daily.       Marland Kitchen esomeprazole (NEXIUM) 40 MG capsule Take 1 capsule (40 mg total) by mouth as needed.  30 capsule  0  . fish oil-omega-3 fatty acids 1000 MG capsule Take 2 g by mouth.       . Flaxseed, Linseed, (FLAX SEED OIL) 1000 MG CAPS Take by mouth daily.        . fluticasone (FLONASE) 50 MCG/ACT nasal spray Place 2 sprays into the nose daily.      . Levothyroxine Sodium (SYNTHROID PO) Take 60 mcg by mouth daily.      Marland Kitchen MAGNESIUM PO Take by mouth daily.      . Potassium 99 MG TABS Take by mouth. Every other day      . ipratropium (ATROVENT) 0.03 %  nasal spray Place 2 sprays into the nose every 12 (twelve) hours.  30 mL  3    BP 120/78  Pulse 80  Ht 5\' 2"  (1.575 m)  Wt 47.174 kg (104 lb)  BMI 19.02 kg/m2 General: NAD Neck: No JVD, no thyromegaly or thyroid nodule.  Lungs: Clear to auscultation bilaterally with normal respiratory effort. CV: Nondisplaced PMI.  Heart regular S1/S2, no S3/S4, no murmur.  No peripheral edema.  No carotid bruit.  Normal pedal pulses.  Abdomen: Soft, nontender, no hepatosplenomegaly, no distention.  Neurologic: Alert and oriented x 3.  Psych: Anxious Extremities: No clubbing or cyanosis.

## 2011-09-21 NOTE — Assessment & Plan Note (Signed)
She seems to have been very sensitive to changes in her thyroid medication.  Now that she is back on levothyroxine, the symptoms have resolved for the most part.  3-week monitor showed only PACs and PVCs.  She has had a structurally normal heart on prior imaging.  As the symptoms are better, I would not make any medication recommendations at this time, especially as she has had such a hard time with beta blockers and CCBs in the past.  She should continue to avoid caffeine and to get regular exercise and adequate sleep.  She should avoid use of stimulant nasal decongestants.  I will give her a prescription for Atrovent nasal spray to see if this helps.

## 2012-11-21 ENCOUNTER — Encounter: Payer: Self-pay | Admitting: Internal Medicine

## 2013-01-18 ENCOUNTER — Encounter: Payer: PRIVATE HEALTH INSURANCE | Admitting: Internal Medicine

## 2013-01-18 ENCOUNTER — Encounter: Payer: Self-pay | Admitting: Internal Medicine

## 2013-02-01 ENCOUNTER — Telehealth: Payer: Self-pay | Admitting: Internal Medicine

## 2013-02-01 ENCOUNTER — Encounter: Payer: Self-pay | Admitting: *Deleted

## 2013-02-01 ENCOUNTER — Ambulatory Visit (INDEPENDENT_AMBULATORY_CARE_PROVIDER_SITE_OTHER): Payer: Medicare Other | Admitting: Cardiology

## 2013-02-01 ENCOUNTER — Encounter: Payer: Self-pay | Admitting: Cardiology

## 2013-02-01 VITALS — BP 124/70 | HR 70 | Ht 62.0 in | Wt 107.0 lb

## 2013-02-01 DIAGNOSIS — R0609 Other forms of dyspnea: Secondary | ICD-10-CM

## 2013-02-01 DIAGNOSIS — E785 Hyperlipidemia, unspecified: Secondary | ICD-10-CM

## 2013-02-01 DIAGNOSIS — R5381 Other malaise: Secondary | ICD-10-CM

## 2013-02-01 DIAGNOSIS — R5383 Other fatigue: Secondary | ICD-10-CM

## 2013-02-01 DIAGNOSIS — R0602 Shortness of breath: Secondary | ICD-10-CM

## 2013-02-01 DIAGNOSIS — R0989 Other specified symptoms and signs involving the circulatory and respiratory systems: Secondary | ICD-10-CM

## 2013-02-01 DIAGNOSIS — R002 Palpitations: Secondary | ICD-10-CM

## 2013-02-01 NOTE — Patient Instructions (Signed)
Your physician recommends that you have lab work today--CBCd/TSH/Lipid profile  Your physician has requested that you have an echocardiogram. Echocardiography is a painless test that uses sound waves to create images of your heart. It provides your doctor with information about the size and shape of your heart and how well your heart's chambers and valves are working. This procedure takes approximately one hour. There are no restrictions for this procedure.  Your physician wants you to follow-up in: 1 year with Dr Shirlee Latch. (November 2015).  You will receive a reminder letter in the mail two months in advance. If you don't receive a letter, please call our office to schedule the follow-up appointment.

## 2013-02-01 NOTE — Telephone Encounter (Signed)
New problem     Pt has been having SOB, very tired, BP is going up, pt wakes up at night with heart beating faster for the last 2 wks.  Things have gotting worse this week.   Pt called for an appointment with Shirlee Latch.

## 2013-02-01 NOTE — Telephone Encounter (Signed)
Pt called to get an appointment with Dr Shirlee Latch pt states she has been SOB and light headed at times pt states her BP usually runs 105 systolic now it has been running 133 systolic. An appointment was made with Dr. Shirlee Latch for today at 3:30 PM. Pt aware of appointment.

## 2013-02-02 LAB — CBC WITH DIFFERENTIAL/PLATELET
Basophils Absolute: 0 10*3/uL (ref 0.0–0.1)
Basophils Relative: 0.5 % (ref 0.0–3.0)
Eosinophils Relative: 1.6 % (ref 0.0–5.0)
Lymphocytes Relative: 32 % (ref 12.0–46.0)
Lymphs Abs: 2.5 10*3/uL (ref 0.7–4.0)
MCV: 87.6 fl (ref 78.0–100.0)
Monocytes Absolute: 0.9 10*3/uL (ref 0.1–1.0)
Monocytes Relative: 11.4 % (ref 3.0–12.0)
Neutrophils Relative %: 54.5 % (ref 43.0–77.0)
Platelets: 335 10*3/uL (ref 150.0–400.0)
RBC: 4.31 Mil/uL (ref 3.87–5.11)
WBC: 7.8 10*3/uL (ref 4.5–10.5)

## 2013-02-02 LAB — TSH: TSH: 0.62 u[IU]/mL (ref 0.35–5.50)

## 2013-02-02 LAB — LIPID PANEL
Cholesterol: 258 mg/dL — ABNORMAL HIGH (ref 0–200)
Total CHOL/HDL Ratio: 4
Triglycerides: 42 mg/dL (ref 0.0–149.0)
VLDL: 8.4 mg/dL (ref 0.0–40.0)

## 2013-02-02 NOTE — Progress Notes (Signed)
Patient ID: Andrea Bates, female   DOB: 1945-06-11, 67 y.o.   MRN: [id number] PCP: Dr. Clovia Cuff Ramella is a 67 yo female with h/o palpitations, hyperlipidemia, and family history of early CAD who is seen in followup today. She underwent cardiac catheterization in 2/12 which showed normal coronaries and normal LV systolic function.  She has had a lot of problems with symptomatic PACs and PVCs in the past.  She has been intolerant of both beta blockers and calcium channel blockers.   She had been doing well up until last Sunday.  On that day, she felt exhausted and short of breath all day.  She had some lightheadedness and dizziness.  She feels a little better but still more fatigued than normal for her.  She has chronic mild dyspnea with walking.  No orthopnea or PND.  She really has not felt much in the way of palpitations recently.   Labs (2/12): TSH, free T4, free T3 all normal. K 3.9, creatinine 0.62.  Labs (3/12): K 4.8, creatinine 0.6, Mg 2.5, urine catecholeamines normal   ECG: NSR, normal  Allergies (verified):  1) * Shellfish   Past Medical History:  1. Hyperlipidemia: She had myalgias with Vytorin and Lipitor.  She had vertigo with Crestor.  2. Hypothyroidism.  3. Diverticulosis.  4. Anxiety.  5. IBS  6. Colonic polyps. ADENOMATOUS-LAST COLON 11/2007  7. GERD, esophagitis on EGD 8. Chest pain: Myoview at [facility] in 2011 was normal. LHC (2/12) showed normal coronaries, EF 65%.  9. Echo (2/10): EF 55-60%, grade I diastolic dysfunction  10. Palpitations: 3 week event monitor (2/12) with occasional sinus tachycardia with rates 110s-120s. Rare PACs/PVCs. Several short 4-5 beat runs of atrial tachycardia.  3-week event monitor in 6/13 showed occasioanl PVCs and PACs.  11. Sleep study normal 2/12  12. Allergic rhinitis  Family History:  Father with h/o 2 heart valve replacements. He also had a stroke. Brother with MI at age 71.  Family History of Colon  Cancer:Great Uncle on Paternal Side  grandother had a goiter.   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 No ETOH.   ROS:  All systems reviewed and negative except as per HPI.    Current Outpatient Prescriptions  Medication Sig Dispense Refill  . Calcium Carbonate-Vitamin D (CALCIUM + D PO) Take 1 tablet by mouth daily.       . cholecalciferol (VITAMIN D) 1000 UNITS tablet Take 4,000 Units by mouth daily.       . Cyanocobalamin (VITAMIN B 12 PO) Take 1 tablet by mouth daily.       . fish oil-omega-3 fatty acids 1000 MG capsule Take 2 g by mouth.       . Flaxseed, Linseed, (FLAX SEED OIL) 1000 MG CAPS Take by mouth daily.        . fluticasone (FLONASE) 50 MCG/ACT nasal spray Place 2 sprays into the nose daily.      Marland Kitchen levothyroxine (SYNTHROID, LEVOTHROID) 75 MCG tablet Take 75 mcg by mouth daily before breakfast.      . Potassium 99 MG TABS Take by mouth. Every other day       No current facility-administered medications for this visit.    BP 124/70  Pulse 70  Ht 5\' 2"  (1.575 m)  Wt 107 lb (48.535 kg)  BMI 19.57 kg/m2 General: NAD Neck: No JVD, no thyromegaly or thyroid nodule.  Lungs: Clear to auscultation bilaterally with normal  respiratory effort. CV: Nondisplaced PMI.  Heart regular S1/S2, no S3/S4, no murmur.  No peripheral edema.  No carotid bruit.  Normal pedal pulses.  Abdomen: Soft, nontender, no hepatosplenomegaly, no distention.  Neurologic: Alert and oriented x 3.  Psych: Anxious Extremities: No clubbing or cyanosis.   Assessment/Plan: 1. Palpitations: Known PACs, short runs of atrial tachycardia.  No problems recently with palpitations.  She cannot tolerate beta blockers or CCBs.  2. CAD risk: Family history of premature CAD.  Normal cardiac cath in 2/12.  She cannot tolerate statins.  3. Fatigue/dyspnea: Nonspecific.  I will get an echocardiogram to make sure that EF remains preserved.  ECG today was normal.  I will  check TSH and CBC.   [name] 02/02/2013

## 2013-02-05 ENCOUNTER — Other Ambulatory Visit: Payer: Self-pay | Admitting: *Deleted

## 2013-02-05 ENCOUNTER — Telehealth: Payer: Self-pay | Admitting: Internal Medicine

## 2013-02-05 DIAGNOSIS — E785 Hyperlipidemia, unspecified: Secondary | ICD-10-CM

## 2013-02-05 NOTE — Telephone Encounter (Signed)
I spoke with this patient and she said she did not call today. She confirmed she is not on coumadin.

## 2013-02-05 NOTE — Telephone Encounter (Signed)
New Problem  Pt called states she had her Coumadin checked locally// request a call back to determine how coumadin should be adjusted going forward// please call

## 2013-02-08 ENCOUNTER — Other Ambulatory Visit (HOSPITAL_COMMUNITY): Payer: Medicare Other

## 2013-03-01 ENCOUNTER — Other Ambulatory Visit (HOSPITAL_COMMUNITY): Payer: Medicare Other

## 2013-04-18 ENCOUNTER — Other Ambulatory Visit: Payer: Medicare Other

## 2013-05-03 ENCOUNTER — Ambulatory Visit (INDEPENDENT_AMBULATORY_CARE_PROVIDER_SITE_OTHER): Payer: Medicare HMO | Admitting: Physician Assistant

## 2013-05-03 VITALS — BP 122/70 | HR 76 | Temp 98.6°F | Resp 16 | Wt 107.0 lb

## 2013-05-03 DIAGNOSIS — R002 Palpitations: Secondary | ICD-10-CM

## 2013-05-03 DIAGNOSIS — R5381 Other malaise: Secondary | ICD-10-CM

## 2013-05-03 DIAGNOSIS — E039 Hypothyroidism, unspecified: Secondary | ICD-10-CM

## 2013-05-03 DIAGNOSIS — N3000 Acute cystitis without hematuria: Secondary | ICD-10-CM

## 2013-05-03 DIAGNOSIS — D509 Iron deficiency anemia, unspecified: Secondary | ICD-10-CM

## 2013-05-03 DIAGNOSIS — R0602 Shortness of breath: Secondary | ICD-10-CM

## 2013-05-03 DIAGNOSIS — R5383 Other fatigue: Secondary | ICD-10-CM

## 2013-05-03 MED ORDER — ALPRAZOLAM 0.25 MG PO TABS: 0.2500 mg | ORAL_TABLET | Freq: Three times a day (TID) | ORAL | Status: DC | PRN

## 2013-05-03 NOTE — Patient Instructions (Signed)
Diet for Gastroesophageal Reflux Disease, Adult Reflux (acid reflux) is when acid from your stomach flows up into the esophagus. When acid comes in contact with the esophagus, the acid causes irritation and soreness (inflammation) in the esophagus. When reflux happens often or so severely that it causes damage to the esophagus, it is called gastroesophageal reflux disease (GERD). Nutrition therapy can help ease the discomfort of GERD. FOODS OR DRINKS TO AVOID OR LIMIT  Smoking or chewing tobacco. Nicotine is one of the most potent stimulants to acid production in the gastrointestinal tract.  Caffeinated and decaffeinated coffee and black tea.  Regular or low-calorie carbonated beverages or energy drinks (caffeine-free carbonated beverages are allowed).   Strong spices, such as black pepper, white pepper, red pepper, cayenne, curry powder, and chili powder.  Peppermint or spearmint.  Chocolate.  High-fat foods, including meats and fried foods. Extra added fats including oils, butter, salad dressings, and nuts. Limit these to less than 8 tsp per day.  Fruits and vegetables if they are not tolerated, such as citrus fruits or tomatoes.  Alcohol.  Any food that seems to aggravate your condition. If you have questions regarding your diet, call your caregiver or a registered dietitian. OTHER THINGS THAT MAY HELP GERD INCLUDE:   Eating your meals slowly, in a relaxed setting.  Eating 5 to 6 small meals per day instead of 3 large meals.  Eliminating food for a period of time if it causes distress.  Not lying down until 3 hours after eating a meal.  Keeping the head of your bed raised 6 to 9 inches (15 to 23 cm) by using a foam wedge or blocks under the legs of the bed. Lying flat may make symptoms worse.  Being physically active. Weight loss may be helpful in reducing reflux in overweight or obese adults.  Wear loose fitting clothing EXAMPLE MEAL PLAN This meal plan is approximately  2,000 calories based on [email or web address] meal planning guidelines. Breakfast   cup cooked oatmeal.  1 cup strawberries.  1 cup low-fat milk.  1 oz almonds. Snack  1 cup cucumber slices.  6 oz yogurt (made from low-fat or fat-free milk). Lunch  2 slice whole-wheat bread.  2 oz sliced [place].  2 tsp mayonnaise.  1 cup blueberries.  1 cup snap peas. Snack  6 whole-wheat crackers.  1 oz string cheese. Dinner   cup brown rice.  1 cup mixed veggies.  1 tsp olive oil.  3 oz grilled fish. Document Released: 03/08/2005 Document Revised: 05/31/2011 Document Reviewed: 01/22/2011 [facility] Patient Information 2014 [place], [place]. Palpitations  A palpitation is the feeling that your heartbeat is irregular or is faster than normal. It may feel like your heart is fluttering or skipping a beat. Palpitations are usually not a serious problem. However, in some cases, you may need further medical evaluation. CAUSES  Palpitations can be caused by:  Smoking.  Caffeine or other stimulants, such as diet pills or energy drinks.  Alcohol.  Stress and anxiety.  Strenuous physical activity.  Fatigue.  Certain medicines.  Heart disease, especially if you have a history of arrhythmias. This includes atrial fibrillation, atrial flutter, or supraventricular tachycardia.  An improperly working pacemaker or defibrillator. DIAGNOSIS  To find the cause of your palpitations, your caregiver will take your history and perform a physical exam. Tests may also be done, including:  Electrocardiography (ECG). This test records the heart's electrical activity.  Cardiac monitoring. This allows your caregiver to monitor your heart rate and rhythm  in real time.  Holter monitor. This is a portable device that records your heartbeat and can help diagnose heart arrhythmias. It allows your caregiver to track your heart activity for several days, if needed.  Stress tests by exercise or by  giving medicine that makes the heart beat faster. TREATMENT  Treatment of palpitations depends on the cause of your symptoms and can vary greatly. Most cases of palpitations do not require any treatment other than time, relaxation, and monitoring your symptoms. Other causes, such as atrial fibrillation, atrial flutter, or supraventricular tachycardia, usually require further treatment. HOME CARE INSTRUCTIONS   Avoid:  Caffeinated coffee, tea, soft drinks, diet pills, and energy drinks.  Chocolate.  Alcohol.  Stop smoking if you smoke.  Reduce your stress and anxiety. Things that can help you relax include:  A method that measures bodily functions so you can learn to control them (biofeedback).  Yoga.  Meditation.  Physical activity such as swimming, jogging, or walking.  Get plenty of rest and sleep. SEEK MEDICAL CARE IF:   You continue to have a fast or irregular heartbeat beyond 24 hours.  Your palpitations occur more often. SEEK IMMEDIATE MEDICAL CARE IF:  You develop chest pain or shortness of breath.  You have a severe headache.  You feel dizzy, or you faint. MAKE SURE YOU:  Understand these instructions.  Will watch your condition.  Will get help right away if you are not doing well or get worse. Document Released: 03/05/2000 Document Revised: 07/03/2012 Document Reviewed: 05/07/2011 [facility] Patient Information 2014 [place].

## 2013-05-03 NOTE — Progress Notes (Signed)
Subjective:    Andrea Bates is a 68 y.o. female who presents with a fluttering sensation, palpitations, shortness of breath, a skipping sensation and fatigue. The symptoms are moderate, occur intermittently and but skipping feeling everyday, and last 5-7  days per episode. They tend to occur while exerting, very fatigued with exertion. Cardiac risk factors include: advanced age (older than 40 for men, 22 for women), diabetes mellitus and dyslipidemia. Aggravating factors: caffeine, exercise, stress/anxiety, thyroid medication. Relieving factors: rest and when she did not take her thyroid medication she felt better. Associated symptoms: dizziness, palpitations, rapid heart beat, shortness of breath and fatigue. Patient denies: abdominal pain, chest pain, cough, slow heart beat and syncope.  She sees Dr. Aundra Dubin and had a normal heart monitor 2013 with PVCs, normal echo 2010.  The following portions of the patient's history were reviewed and updated as appropriate: allergies, current medications, past family history, past medical history, past social history, past surgical history and problem list.  Current Outpatient Prescriptions on File Prior to Visit  Medication Sig Dispense Refill  . Calcium Carbonate-Vitamin D (CALCIUM + D PO) Take 1 tablet by mouth daily.       . cholecalciferol (VITAMIN D) 1000 UNITS tablet Take 4,000 Units by mouth daily.       . Cyanocobalamin (VITAMIN B 12 PO) Take 1 tablet by mouth daily.       . fish oil-omega-3 fatty acids 1000 MG capsule Take 2 g by mouth.       . Flaxseed, Linseed, (FLAX SEED OIL) 1000 MG CAPS Take by mouth daily.        . fluticasone (FLONASE) 50 MCG/ACT nasal spray Place 2 sprays into the nose daily.      Marland Kitchen levothyroxine (SYNTHROID, LEVOTHROID) 75 MCG tablet Take 75 mcg by mouth daily before breakfast.       No current facility-administered medications on file prior to visit.   Past Medical History  Diagnosis Date  . Diverticulosis   .  Hiatal hernia   . Hypothyroidism   . Personal history of colonic polyps     a. colonoscopy 2009  . Personal history of malignant neoplasm of breast   . Hemorrhoids   . Hyperlipidemia   . Palpitations     a. 04/2008 echo: EF 50-60%;  b. 21 day event monitor 2012 - occasional sinus tachycardia with rates 110-120's. Rare PAC's/PVC's. Serveral short 4-5 beat runs of a-tach.  . Irritable bowel syndrome   . Hyperlipidemia     Is now taking Fish oil. Had myalgias with Vytorin and Lipitor  . GERD (gastroesophageal reflux disease)     a. mild esophagitis on egd 2012  . Pre-diabetes   . Chest pain     a. negative myoview 2011 [name]);  b. 04/2010 Cath: normal cors    Review of Systems Constitutional: positive for fatigue Ears, nose, mouth, throat, and face: negative Respiratory: positive for cough and dyspnea on exertion Cardiovascular: positive for dyspnea, fatigue, irregular heart beat and palpitations Gastrointestinal: positive for reflux symptoms Genitourinary:negative Musculoskeletal:negative Neurological: positive for dizziness Endocrine: negative  Objective:    BP 122/70  Pulse 76  Temp(Src) 98.6 F (37 C)  Resp 16  Wt 107 lb (48.535 kg) General appearance: alert, cooperative and no distress Head: Normocephalic, without obvious abnormality, atraumatic Eyes: conjunctivae/corneas clear. PERRL, EOM's intact. Fundi benign. Neck: no adenopathy, no carotid bruit, no JVD, supple, symmetrical, trachea midline and thyroid not enlarged, symmetric, no tenderness/mass/nodules Lungs: clear to auscultation bilaterally Heart: regular  rate and rhythm, S1: normal and S2: increased intensity, increased on inspiration Abdomen: soft, non-tender; bowel sounds normal; no masses,  no organomegaly Extremities: extremities normal, atraumatic, no cyanosis or edema Pulses: 2+ and symmetric Skin: Skin color, texture, turgor normal. No rashes or lesions Neurologic: Grossly  normal  Cardiographics ECG: normal sinus rhythm   Assessment and Plan:   Palpitations with fatigue- She has seen Dr. Aundra Dubin in the past with a normal holter and normal echo-  Hyperthyroid due to medications- hold thyroid med until results come back tomorrow  SVT- Valslva taught- rule out anemia, infection  GERD- PPI sample given  Anxiety- xanax 0.5 given.   Follow up in 1 week.

## 2013-05-04 LAB — URINALYSIS, ROUTINE W REFLEX MICROSCOPIC
BILIRUBIN URINE: NEGATIVE
Glucose, UA: NEGATIVE mg/dL
KETONES UR: 15 mg/dL — AB
Leukocytes, UA: NEGATIVE
Nitrite: NEGATIVE
Protein, ur: NEGATIVE mg/dL
Specific Gravity, Urine: <1.005 — ABNORMAL LOW (ref 1.005–1.030)
UROBILINOGEN UA: 0.2 mg/dL (ref 0.0–1.0)
pH: 6 (ref 5.0–8.0)

## 2013-05-04 LAB — CBC WITH DIFFERENTIAL/PLATELET
Basophils Absolute: 0 10*3/uL (ref 0.0–0.1)
Basophils Relative: 0 % (ref 0–1)
EOS ABS: 0.1 10*3/uL (ref 0.0–0.7)
Eosinophils Relative: 1 % (ref 0–5)
HCT: 39.9 % (ref 36.0–46.0)
HEMOGLOBIN: 13.6 g/dL (ref 12.0–15.0)
Lymphocytes Relative: 26 % (ref 12–46)
Lymphs Abs: 2 10*3/uL (ref 0.7–4.0)
MCH: 29.3 pg (ref 26.0–34.0)
MCHC: 34.1 g/dL (ref 30.0–36.0)
MCV: 86 fL (ref 78.0–100.0)
MONO ABS: 0.7 10*3/uL (ref 0.1–1.0)
MONOS PCT: 8 % (ref 3–12)
Neutro Abs: 5 10*3/uL (ref 1.7–7.7)
Neutrophils Relative %: 65 % (ref 43–77)
PLATELETS: 359 10*3/uL (ref 150–400)
RBC: 4.64 MIL/uL (ref 3.87–5.11)
RDW: 13.6 % (ref 11.5–15.5)
WBC: 7.8 10*3/uL (ref 4.0–10.5)

## 2013-05-04 LAB — HEPATIC FUNCTION PANEL
ALBUMIN: 4.4 g/dL (ref 3.5–5.2)
ALT: 20 U/L (ref 0–35)
AST: 17 U/L (ref 0–37)
Alkaline Phosphatase: 108 U/L (ref 39–117)
Bilirubin, Direct: 0.1 mg/dL (ref 0.0–0.3)
Indirect Bilirubin: 0.3 mg/dL (ref 0.2–1.2)
Total Bilirubin: 0.4 mg/dL (ref 0.2–1.2)
Total Protein: 7.1 g/dL (ref 6.0–8.3)

## 2013-05-04 LAB — IRON AND TIBC
%SAT: 18 % — AB (ref 20–55)
IRON: 65 ug/dL (ref 42–145)
TIBC: 366 ug/dL (ref 250–470)
UIBC: 301 ug/dL (ref 125–400)

## 2013-05-04 LAB — URINALYSIS, MICROSCOPIC ONLY
BACTERIA UA: NONE SEEN
Casts: NONE SEEN
Crystals: NONE SEEN
Squamous Epithelial / LPF: NONE SEEN

## 2013-05-04 LAB — BASIC METABOLIC PANEL WITH GFR
BUN: 15 mg/dL (ref 6–23)
CALCIUM: 9.8 mg/dL (ref 8.4–10.5)
CO2: 29 meq/L (ref 19–32)
CREATININE: 0.67 mg/dL (ref 0.50–1.10)
Chloride: 100 mEq/L (ref 96–112)
GFR, Est African American: >89 mL/min
GFR, Est Non African American: >89 mL/min
GLUCOSE: 84 mg/dL (ref 70–99)
Potassium: 4 mEq/L (ref 3.5–5.3)
Sodium: 137 mEq/L (ref 135–145)

## 2013-05-04 LAB — URINE CULTURE
Colony Count: NO GROWTH
ORGANISM ID, BACTERIA: NO GROWTH

## 2013-05-04 LAB — MAGNESIUM: MAGNESIUM: 2.2 mg/dL (ref 1.5–2.5)

## 2013-05-04 LAB — TSH: TSH: 1.146 u[IU]/mL (ref 0.350–4.500)

## 2013-05-09 ENCOUNTER — Telehealth: Payer: Self-pay | Admitting: Cardiology

## 2013-05-09 DIAGNOSIS — R002 Palpitations: Secondary | ICD-10-CM

## 2013-05-09 NOTE — Telephone Encounter (Signed)
Spoke with patient--pt states for the last week or so she has more frequent palpitations and increase in BP to 120-140/80-90 with minimal exertion. She also describes a pulling in her neck to her chest,especially at night, but can be at random times. She denies lightheadedness or dizziness. She has been unable to tolerate atenolol, propranolol, and cardizem in the past.. She did see her PCP last week and was told EKG was smooth. She was given Xanax for anxiety by PCP, her daughter in law has been sick with cancer. Pt states when she took Xanax it made her feel bad, but she will try to take it more regularly to see if it helps her symptoms.  I reviewed with Dr Aundra Dubin and he recommended a 48 hour monitor. Pt did not want to schedule monitor at this time. She has echo scheduled next week and will call back if she decides to have monitor.

## 2013-05-09 NOTE — Telephone Encounter (Signed)
Follow up  ° ° ° °Returning call back to nurse  °

## 2013-05-09 NOTE — Telephone Encounter (Signed)
LMTCB

## 2013-05-09 NOTE — Telephone Encounter (Signed)
New message         Pt is experiencing high blood pressure and fast heart beats at night with no symptoms.

## 2013-05-16 ENCOUNTER — Other Ambulatory Visit (HOSPITAL_COMMUNITY): Payer: Medicare Other

## 2013-05-23 ENCOUNTER — Telehealth: Payer: Self-pay | Admitting: *Deleted

## 2013-05-23 NOTE — Telephone Encounter (Signed)
Spoke with Mrs.Scrima today about her monitor and she refused at this time, she said the problem has cleared up.

## 2013-05-25 ENCOUNTER — Other Ambulatory Visit (HOSPITAL_COMMUNITY): Payer: Medicare Other

## 2013-05-30 ENCOUNTER — Other Ambulatory Visit (HOSPITAL_COMMUNITY): Payer: Medicare Other

## 2013-06-03 ENCOUNTER — Other Ambulatory Visit: Payer: Self-pay | Admitting: Physician Assistant

## 2013-06-06 ENCOUNTER — Ambulatory Visit: Payer: Self-pay | Admitting: Physician Assistant

## 2013-06-06 ENCOUNTER — Ambulatory Visit (INDEPENDENT_AMBULATORY_CARE_PROVIDER_SITE_OTHER): Payer: Medicare HMO | Admitting: Internal Medicine

## 2013-06-06 ENCOUNTER — Encounter: Payer: Self-pay | Admitting: Internal Medicine

## 2013-06-06 ENCOUNTER — Other Ambulatory Visit: Payer: Self-pay | Admitting: Internal Medicine

## 2013-06-06 VITALS — BP 138/78 | HR 76 | Temp 98.2°F | Resp 16 | Ht 62.0 in | Wt 105.0 lb

## 2013-06-06 DIAGNOSIS — I10 Essential (primary) hypertension: Secondary | ICD-10-CM

## 2013-06-06 DIAGNOSIS — E785 Hyperlipidemia, unspecified: Secondary | ICD-10-CM

## 2013-06-06 DIAGNOSIS — R7309 Other abnormal glucose: Secondary | ICD-10-CM

## 2013-06-06 DIAGNOSIS — R7303 Prediabetes: Secondary | ICD-10-CM | POA: Insufficient documentation

## 2013-06-06 DIAGNOSIS — E039 Hypothyroidism, unspecified: Secondary | ICD-10-CM

## 2013-06-06 DIAGNOSIS — R002 Palpitations: Secondary | ICD-10-CM

## 2013-06-06 DIAGNOSIS — Z79899 Other long term (current) drug therapy: Secondary | ICD-10-CM

## 2013-06-06 DIAGNOSIS — E559 Vitamin D deficiency, unspecified: Secondary | ICD-10-CM | POA: Insufficient documentation

## 2013-06-06 LAB — LIPID PANEL
CHOL/HDL RATIO: 3.8 ratio
Cholesterol: 259 mg/dL — ABNORMAL HIGH (ref 0–200)
HDL: 68 mg/dL (ref >39)
LDL Cholesterol: 180 mg/dL — ABNORMAL HIGH (ref 0–99)
Triglycerides: 57 mg/dL (ref <150)
VLDL: 11 mg/dL (ref 0–40)

## 2013-06-06 LAB — HEMOGLOBIN A1C
HEMOGLOBIN A1C: 5.5 % (ref <5.7)
MEAN PLASMA GLUCOSE: 111 mg/dL (ref <117)

## 2013-06-06 LAB — TSH: TSH: 1.497 u[IU]/mL (ref 0.350–4.500)

## 2013-06-06 MED ORDER — VERAPAMIL HCL 80 MG PO TABS: ORAL_TABLET | ORAL | Status: DC

## 2013-06-06 NOTE — Progress Notes (Signed)
Patient ID: Andrea Bates, female   DOB: December 22, 1945, 68 y.o.   MRN: [id number]    This very nice 68 y.o. MWF presents for 3 month follow up with Hypothyroidism, Hyperlipidemia, Pre-Diabetes and Vitamin D Deficiency.    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 BP has been controlled at home. Today's BP: 138/78 mmHg. Patient's mother had Heart Dz and she is TERRIFIED that she may have underlying Heart Dz. In July 2012, she had a negative and normal Heart Cath. Patient denies any cardiac or exertional type chest pain, palpitations, dyspnea/orthopnea/PND, dizziness, claudication, or dependent edema.   Hyperlipidemia is not controlled with diet & and patient is statin allergic or intolerant. Last Cholesterol was 267, Triglycerides were 72, HDL 74 and LDL 181 in July 2014 . Patient denies myalgias or other med SE's.    Also, the patient is screened for PreDiabetes/insulin resistance with last A1c of 5.4% in July 2014. Patient denies any symptoms of reactive hypoglycemia, diabetic polys, paresthesias or visual blurring.   Further, Patient has history of Vitamin D Deficiency of 80 in Feb 2014 and last vitamin D was 72 in July 2014. Patient supplements vitamin D without any suspected side-effects.  Medication Sig  . ALPRAZolam (XANAX) 0.25 MG tablet Take 1 tablet (0.25 mg total) by mouth 3 (three) times daily as needed for anxiety.  . Calcium Carbonate-Vitamin D (CALCIUM + D PO) Take 1 tablet by mouth daily.   . cholecalciferol (VITAMIN D) 1000 UNITS tablet Take 4,000 Units by mouth daily.   . Cyanocobalamin (VITAMIN B 12 PO) Take 1 tablet by mouth daily.   . fish oil-omega-3 fatty acids 1000 MG capsule Take 2 g by mouth.   . Flaxseed, Linseed, (FLAX SEED OIL) 1000 MG  Take by mouth daily.    . fluticasone (FLONASE) 50 MCG/ACT nasal spray Place 2 sprays into the nose daily.  Marland Kitchen SYNTHROID 75 MCG tablet TAKE 1 TABLET BY MOUTH EVERY DAY      Allergies  Allergen Reactions   . Armour Thyroid [Thyroid]     Palpitations   . Atenolol     presyncope  . Celexa [Citalopram Hydrobromide]     Severe chest tightness   . Diltiazem Hcl     Dropped bp  . Lipitor [Atorvastatin]     Myalgias   . Propranolol Hcl     Felt bad  . Vytorin [Ezetimibe-Simvastatin]     Myalgias     PMHx:   Past Medical History  Diagnosis Date  . Diverticulosis   . Hiatal hernia   . Hypothyroidism   . Personal history of colonic polyps     a. colonoscopy 2009  . Personal history of malignant neoplasm of breast   . Hemorrhoids   . Hyperlipidemia   . Palpitations     a. 04/2008 echo: EF 50-60%;  b. 21 day event monitor 2012 - occasional sinus tachycardia with rates 110-120's. Rare PAC's/PVC's. Serveral short 4-5 beat runs of a-tach.  . Irritable bowel syndrome   . Hyperlipidemia     Is now taking Fish oil. Had myalgias with Vytorin and Lipitor  . GERD (gastroesophageal reflux disease)     a. mild esophagitis on egd 2012  . Pre-diabetes   . Chest pain     a. negative myoview 2011 [name]);  b. 04/2010 Cath: normal cors    FHx:    Reviewed / unchanged  SHx:    Reviewed / unchanged   Systems Review: Constitutional: Denies fever,  chills, wt changes, headaches, insomnia, fatigue, night sweats, change in appetite. Eyes: Denies redness, blurred vision, diplopia, discharge, itchy, watery eyes.  ENT: Denies discharge, congestion, post nasal drip, epistaxis, sore throat, earache, hearing loss, dental pain, tinnitus, vertigo, sinus pain, snoring.  CV: Denies chest pain, palpitations, irregular heartbeat, syncope, dyspnea, diaphoresis, orthopnea, PND, claudication, edema. Respiratory: denies cough, dyspnea, DOE, pleurisy, hoarseness, laryngitis, wheezing.  Gastrointestinal: Denies dysphagia, odynophagia, heartburn, reflux, water brash, abdominal pain or cramps, nausea, vomiting, bloating, diarrhea, constipation, hematemesis, melena, hematochezia,  or hemorrhoids. Genitourinary:  Denies dysuria, frequency, urgency, nocturia, hesitancy, discharge, hematuria, flank pain. Musculoskeletal: Denies arthralgias, myalgias, stiffness, jt. swelling, pain, limp, strain/sprain.  Skin: Denies pruritus, rash, hives, warts, acne, eczema, change in skin lesion(s). Neuro: No weakness, tremor, incoordination, spasms, paresthesia, or pain. Psychiatric: Denies confusion, memory loss, or sensory loss. Endo: Denies change in weight, skin, hair change.  Heme/Lymph: No excessive bleeding, bruising, orenlarged lymph nodes.   Exam:  BP 138/78  Pulse 76  Temp(Src) 98.2 F (36.8 C) (Temporal)  Resp 16  Ht 5\' 2"  (1.575 m)  Wt 105 lb (47.628 kg)  BMI 19.20 kg/m2  Appears well nourished - in no distress. Eyes: PERRLA, EOMs, conjunctiva no swelling or erythema. Sinuses: No frontal/maxillary tenderness ENT/Mouth: EAC's clear, TM's nl w/o erythema, bulging. Nares clear w/o erythema, swelling, exudates. Oropharynx clear without erythema or exudates. Oral hygiene is good. Tongue normal, non obstructing. Hearing intact.  Neck: Supple. Thyroid nl. Car 2+/2+ without bruits, nodes or JVD. Chest: Respirations nl with BS clear & equal w/o rales, rhonchi, wheezing or stridor.  Cor: Heart sounds normal w/ regular rate and rhythm without sig. murmurs, gallops, clicks, or rubs. Peripheral pulses normal and equal  without edema.  Abdomen: Soft & bowel sounds normal. Non-tender w/o guarding, rebound, hernias, masses, or organomegaly.  Lymphatics: Unremarkable.  Musculoskeletal: Full ROM all peripheral extremities, joint stability, 5/5 strength, and normal gait.  Skin: Warm, dry without exposed rashes, lesions, ecchymosis apparent.  Neuro: Cranial nerves intact, reflexes equal bilaterally. Sensory-motor testing grossly intact. Tendon reflexes grossly intact.  Pysch: Alert & oriented x 3. Occasionally becomes tearful talking about all of her family problems. No suicidial ideations expressed or  implied.  Assessment and Plan:  1. Hypothyroidism - Continue monitor blood pressure at home. Continue diet/meds same.  2. Hyperlipidemia - Continue diet/meds, exercise,& lifestyle modifications. Continue monitor periodic cholesterol/liver & renal functions   3. Pre-diabetes, Screening - Continue diet, exercise, lifestyle modifications. Monitor appropriate labs.  4. Vitamin D Deficiency - Continue supplementation.  5. Labile HTN - Rx Verapamil 80 mg #90 to take 1/2 to 1 tablet 2 - 3 x daily for palpitations.  6. Chronic and Acute Anxiety   Recommended regular exercise, BP monitoring, weight control, and discussed med and SE's. Recommended labs to assess and monitor clinical status. Further disposition pending results of labs.

## 2013-06-06 NOTE — Patient Instructions (Addendum)
Palpitations  A palpitation is the feeling that your heartbeat is irregular. It may feel like your heart is fluttering or skipping a beat. It may also feel like your heart is beating faster than normal. This is usually not a serious problem. In some cases, you may need more medical tests. HOME CARE  Avoid:  Caffeine in coffee, tea, soft drinks, diet pills, and energy drinks.  Chocolate.  Alcohol.  Stop smoking if you smoke.  Reduce your stress and anxiety. Try:  A method that measures bodily functions so you can learn to control them (biofeedback).  Yoga.  Meditation.  Physical activity such as swimming, jogging, or walking.  Get plenty of rest and sleep. GET HELP RIGHT AWAY IF:   You have chest pain.  You feel short of breath.  You have a very bad headache.  You feel dizzy or pass out (faint).  Your fast or irregular heartbeat continues after 24 hours.  Your palpitations occur more often. MAKE SURE YOU:   Understand these instructions.  Will watch your condition.  Will get help right away if you are not doing well or get worse. Document Released: 12/16/2007 Document Revised: 09/07/2011 Document Reviewed: 05/07/2011 [facility] Patient Information 2014 [place].   Hypothyroidism The thyroid is a large gland located in the lower front of your neck. The thyroid gland helps control metabolism. Metabolism is how your body handles food. It controls metabolism with the hormone thyroxine. When this gland is underactive (hypothyroid), it produces too little hormone.  CAUSES These include:   Absence or destruction of thyroid tissue.  Goiter due to iodine deficiency.  Goiter due to medications.  Congenital defects (since birth).  Problems with the pituitary. This causes a lack of TSH (thyroid stimulating hormone). This hormone tells the thyroid to turn out more hormone. SYMPTOMS  Lethargy (feeling as though you have no energy)  Cold intolerance  Weight  gain (in spite of normal food intake)  Dry skin  Coarse hair    Menstrual irregularity (if severe, may lead to infertility)  Slowing of thought processes Cardiac problems are also caused by insufficient amounts of thyroid hormone. Hypothyroidism in the newborn is cretinism, and is an extreme form. It is important that this form be treated adequately and immediately or it will lead rapidly to retarded physical and mental development. DIAGNOSIS  To prove hypothyroidism, your caregiver may do blood tests and ultrasound tests. Sometimes the signs are hidden. It may be necessary for your caregiver to watch this illness with blood tests either before or after diagnosis and treatment. TREATMENT  Low levels of thyroid hormone are increased by using synthetic thyroid hormone. This is a safe, effective treatment. It usually takes about four weeks to gain the full effects of the medication. After you have the full effect of the medication, it will generally take another four weeks for problems to leave. Your caregiver may start you on low doses. If you have had heart problems the dose may be gradually increased. It is generally not an emergency to get rapidly to normal. HOME CARE INSTRUCTIONS   Take your medications as your caregiver suggests. Let your caregiver know of any medications you are taking or start taking. Your caregiver will help you with dosage schedules.  As your condition improves, your dosage needs may increase. It will be necessary to have continuing blood tests as suggested by your caregiver.  Report all suspected medication side effects to your caregiver. SEEK MEDICAL CARE IF: Seek medical care if  you develop:  Sweating.  Tremulousness (tremors).  Anxiety.  Rapid weight loss.  Heat intolerance.  Emotional swings.  Diarrhea.  Weakness. SEEK IMMEDIATE MEDICAL CARE IF:  You develop chest pain, an irregular heart beat (palpitations), or a rapid heart beat. MAKE SURE  YOU:   Understand these instructions.  Will watch your condition.  Will get help right away if you are not doing well or get worse. Document Released: 03/08/2005 Document Revised: 05/31/2011 Document Reviewed: 10/27/2007 [facility] Patient Information 2014 [place].   Hypercholesterolemia High Blood Cholesterol Cholesterol is a white, waxy, fat-like protein needed by your body in small amounts. The liver makes all the cholesterol you need. It is carried from the liver by the blood through the blood vessels. Deposits (plaque) may build up on blood vessel walls. This makes the arteries narrower and stiffer. Plaque increases the risk for heart attack and stroke. You cannot feel your cholesterol level even if it is very high. The only way to know is by a blood test to check your lipid (fats) levels. Once you know your cholesterol levels, you should keep a record of the test results. Work with your caregiver to to keep your levels in the desired range. WHAT THE RESULTS MEAN:  Total cholesterol is a rough measure of all the cholesterol in your blood.  LDL is the so-called bad cholesterol. This is the type that deposits cholesterol in the walls of the arteries. You want this level to be low.  HDL is the good cholesterol because it cleans the arteries and carries the LDL away. You want this level to be high.  Triglycerides are fat that the body can either burn for energy or store. High levels are closely linked to heart disease. DESIRED LEVELS:  Total cholesterol below 200.  LDL below 100 for people at risk, below 70 for very high risk.  HDL above 50 is good, above 60 is best.  Triglycerides below 150. HOW TO LOWER YOUR CHOLESTEROL:  Diet.  Choose fish or white meat chicken and [place], roasted or baked. Limit fatty cuts of red meat, fried foods, and processed meats, such as sausage and lunch meat.  Eat lots of fresh fruits and vegetables. Choose whole grains, beans, pasta,  potatoes and cereals.  Use only small amounts of olive, corn or canola oils. Avoid butter, mayonnaise, shortening or palm kernel oils. Avoid foods with trans-fats.  Use skim/nonfat milk and low-fat/nonfat yogurt and cheeses. Avoid whole milk, cream, ice cream, egg yolks and cheeses. Healthy desserts include angel food cake, gingersnaps, animal crackers, hard candy, popsicles, and low-fat/nonfat frozen yogurt. Avoid pastries, cakes, pies and cookies.  Exercise.  A regular program helps decrease LDL and raises HDL.  Helps with weight control.  Do things that increase your activity level like gardening, walking, or taking the stairs.  Medication.  May be prescribed by your caregiver to help lowering cholesterol and the risk for heart disease.  You may need medicine even if your levels are normal if you have several risk factors. HOME CARE INSTRUCTIONS   Follow your diet and exercise programs as suggested by your caregiver.  Take medications as directed.  Have blood work done when your caregiver feels it is necessary. MAKE SURE YOU:   Understand these instructions.  Will watch your condition.  Will get help right away if you are not doing well or get worse. Document Released: 03/08/2005 Document Revised: 05/31/2011 Document Reviewed: 08/24/2006 [facility] Patient Information 2014 [place]. Diabetes, Type 2, Am I  At Risk? Diabetes is a lasting (chronic) disease. In type 2 diabetes, the pancreas does not make enough insulin, and the body does not respond normally to the insulin that is made. This type of diabetes was also previously called adult onset diabetes. About 90% of all those who have diabetes have type 2. It usually occurs after the age of 6, but can occur at any age.  People develop type 2 diabetes because they do not use insulin properly. Eventually, the pancreas cannot make enough insulin for the body's needs. Over time, the amount of glucose (sugar) in the blood  increases. RISK FACTORS  Overweight  the more weight you have, the more resistant your cells become to insulin.  Family history  you are more likely to get diabetes if a parent or sibling has diabetes.  Race certain races get diabetes more.  African Americans.  American Indians.  Asian Americans.  Hispanics.  Pacific Islander.  Inactive exercise helps control weight and helps your cells be more sensitive to insulin.  Gestational diabetes  some women develop diabetes while they are pregnant. This goes away when they deliver. However, they are 50-60% more likely to develop type 2 diabetes at a later time.  Having a baby over 9 pounds  a sign that you may have had gestational diabetes.  Age the risk of diabetes goes up as you get older, especially after age 37.  High blood pressure (hypertension). SYMPTOMS Many people have no signs or symptoms. Symptoms can be so mild that you might not even notice them. Some of these signs are:  Increased thirst.  Increased hunger.  Tiredness (fatigue).  Increased urination, especially at night.  Weight loss.  Blurred vision.  Sores that do not heal. WHO SHOULD BE TESTED?  Anyone 78 years or older, especially if overweight, should consider getting tested.  If you are younger than 4, overweight, and have one or more of the risk factors, you should consider getting tested. DIAGNOSIS  Fasting blood glucose (FBS). Usually, 2 are done.  FBS 101-125 mg/dl is considered pre-diabetes.  FBS 126 mg/dl or greater is considered diabetes.  2 hour Oral Glucose Tolerance Test (OGTT). This test is preformed by first having you not eat or drink for several hours. You are then given something sweet to drink and your blood glucose is measured fasting, at one hour and 2 hours. This test tells how well you are able to handle sugars or carbohydrates.  Fasting: 60-100 mg/dl.  1 hour: less than 200 mg/dl.  2 hours: less than 140 mg/dl.  A1c  A1c is a blood glucose test that gives and average of your blood glucose over 3 months. It is the accepted method to use to diagnose diabetes.  A1c 5.7-6.4% is considered pre-diabetes.  A1c 6.5% or greater is considered diabetes. WHAT DOES IT MEAN TO HAVE PRE-DIABETES? Pre-diabetes means you are at risk for getting type 2 diabetes. Your blood glucose is higher than normal, but not yet high enough to diagnose diabetes. The good news is, if you have pre-diabetes you can reduce the risk of getting diabetes and even return to normal blood glucose levels. With modest weight loss and moderate physical activity, you can delay or prevent type 2 diabetes.  PREVENTION You cannot do anything about race, age or family history, but you can lower your chances of getting diabetes. You can:   Exercise regularly and be active.  Reduce fat and calorie intake.  Make wise food choices as much as  you can.  Reduce your intake of salt and alcohol.  Maintain a reasonable weight.  Keep blood pressure in an acceptable range. Take medication if needed.  Not smoke.  Maintain an acceptable cholesterol level (HDL, LDL, Triglycerides). Take medication if needed. DOING MY PART: GETTING STARTED Making big changes in your life is hard, especially if you are faced with more than one change. You can make it easier by taking these steps:  Make a plan to change behavior.  Decide exactly what you will do and when you will do it.  Plan what you need to get ready.  Think about what might prevent you from reaching your goals.  Find family and friends who will support and encourage you.  Decide how you will reward yourself when you do what you have planned.  Your doctor, dietitian, or counselor can help you make a plan. HERE ARE SOME OF THE AREAS YOU MAY WISH TO CHANGE TO REDUCE YOUR RISK OF DIABETES. If you are overweight or obese, choose sensible ways to get in shape. Even small amounts of weight loss, like 5-10  pounds, can help reduce the effects of insulin resistance and help blood glucose control. Diet  Avoid crash diets. Instead, eat less of the foods you usually have. Limit the amount of fat you eat.  Increase your physical activity. Aim for at least 30 minutes of exercise most days of the week.  Set a reasonable weight-loss goal, such as losing 1 pound a week. Aim for a long-term goal of losing 5-7% of your total body weight.  Make wise food choices most of the time.  What you eat has a big impact on your health. By making wise food choices, you can help control your body weight, blood pressure, and cholesterol.  Take a hard look at the serving sizes of the foods you eat. Reduce serving sizes of meat, desserts, and foods high in fat. Increase your intake of fruits and vegetables.  Limit your fat intake to about 25% of your total calories. For example, if your food choices add up to about 2,000 calories a day, try to eat no more than 56 grams of fat. Your caregiver or a dietitian can help you figure out how much fat to have. You can check food labels for fat content too.  You may also want to reduce the number of calories you have each day.  Keep a food log. Write down what you eat, how much you eat, and anything else that helps keep you on track.  When you meet your goal, reward yourself with a nonfood item or activity. Exercise  Be physically active every day.  Keep and exercise log. Write down what exercise you did, for how long, and anything else that keeps you on track.  Regular exercise (like brisk walking) tackles several risk factors at once. It helps you lose weight, it keeps your cholesterol and blood pressure under control, and it helps your body use insulin. People who are physically active for 30 minutes a day, 5 days a week, reduced their risk of type 2 diabetes. If you are not very active, you should start slowly at first. Talk with your caregiver first about what kinds of  exercise would be safe for you. Make a plan to increase your activity level with the goal of being active for at least 30 minutes a day, most days of the week.  Choose activities you enjoy. Here are some ways to work extra activity into  your daily routine:  Take the stairs rather than an elevator or escalator.  Park at the far end of the lot and walk.  Get off the bus a few stops early and walk the rest of the way.  Walk or bicycle instead of drive whenever you can. Medications Some people need medication to help control their blood pressure or cholesterol levels. If you do, take your medicines as directed. Ask your caregiver whether there are any medicines you can take to prevent type 2 diabetes. Document Released: 03/11/2003 Document Revised: 05/31/2011 Document Reviewed: 12/04/2008 [facility] Patient Information 2014 [place].

## 2013-06-07 LAB — INSULIN, FASTING: INSULIN FASTING, SERUM: 6 u[IU]/mL (ref 3–28)

## 2013-06-08 ENCOUNTER — Other Ambulatory Visit: Payer: Self-pay | Admitting: Internal Medicine

## 2013-06-08 MED ORDER — EZETIMIBE 10 MG PO TABS: 10.0000 mg | ORAL_TABLET | Freq: Every day | ORAL | Status: DC

## 2013-06-11 ENCOUNTER — Telehealth: Payer: Self-pay | Admitting: Internal Medicine

## 2013-06-11 NOTE — Telephone Encounter (Signed)
Pt has multiple complaints regarding non cardiac chest pain and spasms in her esophagus. Also states she is past due for her oolon and she has noticed a change in her bowels. Pt requests to be seen prior to first available. Pt scheduled to see Tye Savoy NP Wednesday at 3:30pm. Pt aware of appt.

## 2013-06-13 ENCOUNTER — Ambulatory Visit: Payer: Medicare HMO | Admitting: Nurse Practitioner

## 2013-06-13 ENCOUNTER — Encounter: Payer: Self-pay | Admitting: Nurse Practitioner

## 2013-06-13 ENCOUNTER — Ambulatory Visit (INDEPENDENT_AMBULATORY_CARE_PROVIDER_SITE_OTHER): Payer: Medicare HMO | Admitting: Nurse Practitioner

## 2013-06-13 VITALS — BP 110/74 | HR 76 | Ht 62.0 in | Wt 103.8 lb

## 2013-06-13 DIAGNOSIS — R109 Unspecified abdominal pain: Secondary | ICD-10-CM

## 2013-06-13 DIAGNOSIS — R1310 Dysphagia, unspecified: Secondary | ICD-10-CM

## 2013-06-13 DIAGNOSIS — Z1211 Encounter for screening for malignant neoplasm of colon: Secondary | ICD-10-CM

## 2013-06-13 DIAGNOSIS — R1319 Other dysphagia: Secondary | ICD-10-CM

## 2013-06-13 DIAGNOSIS — K5900 Constipation, unspecified: Secondary | ICD-10-CM

## 2013-06-13 MED ORDER — MOVIPREP 100 G PO SOLR: 1.0000 | ORAL | Status: DC

## 2013-06-13 NOTE — Progress Notes (Signed)
HPI :   Patient is a 69 year old female known to Dr. Henrene Pastor. She is due for surveillance colonoscopy but here to discuss some GI issues.    Patient complains of a pulling sensation in anterior neck, unrelated to eating though she has noted some recent solid food dysphagia. She complains of heart pounding when she eats. Three years ago patient had a similar problems, saw cardiology. Cardiac workup was negative.he is scheduled for an echocardiogram tomorrow.   In the fall patient developed acute diffuse lower abdominal pain which lasted for a couple of weeks. Since then her bowels have been different. Now, instead of 1-2 BMs a day she is having one larger BM every 3rd day of so. She has also been having intermittent periumbilical pain unrelate to eating. Pain most noticeable when in sitting position.  Past Medical History  Diagnosis Date  . Diverticulosis   . Hiatal hernia   . Hypothyroidism   . Personal history of colonic polyps     a. colonoscopy 2009  . Personal history of malignant neoplasm of breast   . Hemorrhoids   . Hyperlipidemia   . Palpitations     a. 04/2008 echo: EF 50-60%;  b. 21 day event monitor 2012 - occasional sinus tachycardia with rates 110-120's. Rare PAC's/PVC's. Serveral short 4-5 beat runs of a-tach.  . Irritable bowel syndrome   . Hyperlipidemia     Is now taking Fish oil. Had myalgias with Vytorin and Lipitor  . GERD (gastroesophageal reflux disease)     a. mild esophagitis on egd 2012  . Pre-diabetes   . Chest pain     a. negative myoview 2011 [name]);  b. 04/2010 Cath: normal cors    Family History  Problem Relation Age of Onset  . Colon cancer      Paternal Wiliam Ke   . Diabetes Paternal Grandmother   . Heart defect Father    History  Substance Use Topics  . Smoking status: Never Smoker   . Smokeless tobacco: Never Used  . Alcohol Use: No   Current Outpatient Prescriptions  Medication Sig Dispense Refill  . ALPRAZolam (XANAX) 0.25 MG  tablet Take 1 tablet (0.25 mg total) by mouth 3 (three) times daily as needed for anxiety.  30 tablet  0  . Calcium Carbonate-Vitamin D (CALCIUM + D PO) Take 1 tablet by mouth daily.       . cholecalciferol (VITAMIN D) 1000 UNITS tablet Take 4,000 Units by mouth daily.       . fish oil-omega-3 fatty acids 1000 MG capsule Take 2 g by mouth.       . Flaxseed, Linseed, (FLAX SEED OIL) 1000 MG CAPS Take by mouth daily.        . fluticasone (FLONASE) 50 MCG/ACT nasal spray Place 2 sprays into the nose daily.      Marland Kitchen OVER THE COUNTER MEDICATION Sunflower leicthin 1 daily for cholesterol      . SYNTHROID 75 MCG tablet TAKE 1 TABLET BY MOUTH EVERY DAY  30 tablet  6  . ezetimibe (ZETIA) 10 MG tablet Take 1 tablet (10 mg total) by mouth daily. For Cholesterol  90 tablet  99  . verapamil (CALAN) 80 MG tablet Take 1/2 to 1 tablet 2 or 3 x daily for palpitations - TAKE WITH FOOD  90 tablet  1   No current facility-administered medications for this visit.   Allergies  Allergen Reactions  . Armour Thyroid [Thyroid]  Palpitations   . Atenolol     presyncope  . Celexa [Citalopram Hydrobromide]     Severe chest tightness   . Diltiazem Hcl     Dropped bp  . Lipitor [Atorvastatin]     Myalgias   . Propranolol Hcl     Felt bad  . Vytorin [Ezetimibe-Simvastatin]     Myalgias    Review of Systems: All systems reviewed and negative except where noted in HPI.   Physical Exam: BP 110/74  Pulse 76  Ht 5\' 2"  (1.575 m)  Wt 103 lb 12.8 oz (47.083 kg)  BMI 18.98 kg/m2 Constitutional: Pleasant,well-developed, white female in no acute distress. HEENT: Normocephalic and atraumatic. Conjunctivae are normal. No scleral icterus. Neck supple.  Cardiovascular: Normal rate, regular rhythm.  Pulmonary/chest: Effort normal and breath sounds normal. No wheezing, rales or rhonchi. Abdominal: Soft, nondistended, nontender. Bowel sounds active throughout. There are no masses palpable. No  hepatomegaly. Extremities: no edema Lymphadenopathy: No cervical adenopathy noted. Neurological: Alert and oriented to person place and time. Skin: Skin is warm and dry. No rashes noted. Psychiatric: Normal mood and affect. Behavior is normal.   ASSESSMENT AND PLAN:  38. 68 year old female with history of adenomatous colon pollps due for surveillance colonoscopy. The risks, benefits, and alternatives to colonoscopy with possible biopsy and possible polypectomy were discussed with the patient and she consents to proceed.   2. Abdominal pain / bowel changes. Abdominal discomfort could be secondnary to constipation. Recommended daily Miralax as needed. Further evaluation at time of colonoscopy.   3. Intermittent solid food dysphagia. Could be a ring or stricture. Will schedule for EGD with possible dilation to be done at time of colonoscopy. The benefits, risks, and potential complications of EGD with possible biopsies and/or dilation were discussed with the patient and she agrees to proceed.       Trial of daily Miralax prn

## 2013-06-13 NOTE — Patient Instructions (Addendum)
Take Miralax once daily.  We sent a prescription for the colonoscopy prep to [facility] [place]. You have been scheduled for an endoscopy and colonoscopy with propofol. Please follow the written instructions given to you at your visit today. Please pick up your prep at the pharmacy within the next 1-3 days. If you use inhalers (even only as needed), please bring them with you on the day of your procedure.

## 2013-06-14 ENCOUNTER — Ambulatory Visit (HOSPITAL_COMMUNITY): Payer: Medicare HMO | Attending: Internal Medicine | Admitting: Radiology

## 2013-06-14 ENCOUNTER — Encounter: Payer: Self-pay | Admitting: Nurse Practitioner

## 2013-06-14 ENCOUNTER — Encounter: Payer: Self-pay | Admitting: Internal Medicine

## 2013-06-14 DIAGNOSIS — R002 Palpitations: Secondary | ICD-10-CM | POA: Insufficient documentation

## 2013-06-14 DIAGNOSIS — R0609 Other forms of dyspnea: Secondary | ICD-10-CM | POA: Insufficient documentation

## 2013-06-14 DIAGNOSIS — R5383 Other fatigue: Secondary | ICD-10-CM

## 2013-06-14 DIAGNOSIS — R5381 Other malaise: Secondary | ICD-10-CM | POA: Insufficient documentation

## 2013-06-14 DIAGNOSIS — R072 Precordial pain: Secondary | ICD-10-CM

## 2013-06-14 DIAGNOSIS — R0602 Shortness of breath: Secondary | ICD-10-CM

## 2013-06-14 DIAGNOSIS — R0989 Other specified symptoms and signs involving the circulatory and respiratory systems: Secondary | ICD-10-CM | POA: Insufficient documentation

## 2013-06-14 NOTE — Progress Notes (Signed)
Agree with initial assessment and plans. Patient well known to me

## 2013-06-14 NOTE — Progress Notes (Signed)
Echocardiogram Performed. 

## 2013-06-18 ENCOUNTER — Telehealth: Payer: Self-pay | Admitting: Cardiology

## 2013-06-18 DIAGNOSIS — R002 Palpitations: Secondary | ICD-10-CM

## 2013-06-18 DIAGNOSIS — R079 Chest pain, unspecified: Secondary | ICD-10-CM

## 2013-06-18 NOTE — Telephone Encounter (Signed)
New message    Want echo results---got it thru mychart but want to talk to a nurse

## 2013-06-19 ENCOUNTER — Encounter: Payer: Self-pay | Admitting: Internal Medicine

## 2013-06-19 NOTE — Telephone Encounter (Signed)
See echo/ questions were sent to Dr Aundra Dubin to answer.

## 2013-06-27 NOTE — Telephone Encounter (Signed)
Following up    Pt is waiting on your call and has another question about her Echo on 3/26. Pt stated she is worrying about her test results.  Pt needs a call back today.

## 2013-06-27 NOTE — Telephone Encounter (Signed)
Pt advised Dr Aundra Dubin will call and discuss echo results with her tomorrow.

## 2013-06-27 NOTE — Telephone Encounter (Signed)
New Message  Pt called states that she is having some problems with her blood pressure rising..She states that her heart beats are faster than normal at night with often SOB.Marland Kitchen requests a call back to discuss an option to calm the symptoms..  Please call home number if you are calling after 3 pm.

## 2013-06-27 NOTE — Telephone Encounter (Signed)
Pt would like a call after 3PM tomorrow at [phone].

## 2013-06-28 NOTE — Telephone Encounter (Signed)
Anne: please arrange stress echo for Andrea Bates for chest pain (atypical) and 48 hour holter for palpitations.  She can followup afterwards.

## 2013-06-29 ENCOUNTER — Encounter: Payer: Self-pay | Admitting: *Deleted

## 2013-06-29 NOTE — Telephone Encounter (Signed)
Spoke with patient about 48 hour monitor and instructions for stress echo. Will forward to [facility] to contact pt to schedule.

## 2013-06-29 NOTE — Telephone Encounter (Signed)
New problem   Pt need to speak to nurse concerning make some appt. Please call pt on home or cell.

## 2013-07-04 ENCOUNTER — Encounter: Payer: Medicare HMO | Admitting: Internal Medicine

## 2013-07-04 ENCOUNTER — Telehealth: Payer: Self-pay | Admitting: Gastroenterology

## 2013-07-04 ENCOUNTER — Ambulatory Visit (AMBULATORY_SURGERY_CENTER): Payer: Medicare HMO | Admitting: Internal Medicine

## 2013-07-04 ENCOUNTER — Encounter: Payer: Self-pay | Admitting: Internal Medicine

## 2013-07-04 VITALS — BP 120/77 | HR 57 | Resp 19

## 2013-07-04 DIAGNOSIS — Z86010 Personal history of colonic polyps: Secondary | ICD-10-CM

## 2013-07-04 DIAGNOSIS — D126 Benign neoplasm of colon, unspecified: Secondary | ICD-10-CM

## 2013-07-04 DIAGNOSIS — R1310 Dysphagia, unspecified: Secondary | ICD-10-CM

## 2013-07-04 DIAGNOSIS — R109 Unspecified abdominal pain: Secondary | ICD-10-CM

## 2013-07-04 DIAGNOSIS — Z1211 Encounter for screening for malignant neoplasm of colon: Secondary | ICD-10-CM

## 2013-07-04 DIAGNOSIS — K219 Gastro-esophageal reflux disease without esophagitis: Secondary | ICD-10-CM

## 2013-07-04 HISTORY — PX: UPPER GASTROINTESTINAL ENDOSCOPY: SHX188

## 2013-07-04 HISTORY — PX: COLONOSCOPY: SHX174

## 2013-07-04 MED ORDER — SODIUM CHLORIDE 0.9 % IV SOLN: 500.0000 mL | INTRAVENOUS | Status: DC

## 2013-07-04 NOTE — Op Note (Signed)
[place]  [address]. [place], [address]   COLONOSCOPY PROCEDURE REPORT  PATIENT: Andrea Bates, Andrea Bates  MR#: [id number] BIRTHDATE: 1945/08/31 , 67  yrs. old GENDER: Female ENDOSCOPIST: Eustace Quail, MD REFERRED DU:[id number] Program Recall PROCEDURE DATE:  07/04/2013 PROCEDURE:   Colonoscopy with snare polypectomy x 1 First Screening Colonoscopy - Avg.  risk and is 50 yrs.  old or older - No.  Prior Negative Screening - Now for repeat screening. N/A  History of Adenoma - Now for follow-up colonoscopy & has been > or = to 3 yrs.  Yes hx of adenoma.  Has been 3 or more years since last colonoscopy.  Polyps Removed Today? Yes. ASA CLASS:   Class II INDICATIONS:Patient's personal history of adenomatous colon polyps. Prior examinations 2004, 2006, 2009 with adenomas. MEDICATIONS: MAC sedation, administered by CRNA and propofol (Diprivan) 280mg  IV  DESCRIPTION OF PROCEDURE:   After the risks benefits and alternatives of the procedure were thoroughly explained, informed consent was obtained.  A digital rectal exam revealed no abnormalities of the rectum.   The LB [id number]  endoscope was introduced through the anus and advanced to the cecum, which was identified by both the appendix and ileocecal valve. No adverse events experienced.   The quality of the prep was good, using MoviPrep  The instrument was then slowly withdrawn as the colon was fully examined.      COLON FINDINGS: A diminutive polyp was found in the ascending colon. A polypectomy was performed with a cold snare.  The resection was complete and the polyp tissue was completely retrieved.   Moderate diverticulosis was noted The finding was in the left colon.   The colon mucosa was otherwise normal.  Retroflexed views revealed internal hemorrhoids. The time to cecum=3 minutes 47 seconds. Withdrawal time=13 minutes 35 seconds.  The scope was withdrawn and the procedure  completed.  COMPLICATIONS: There were no complications.  ENDOSCOPIC IMPRESSION: 1.   Diminutive polyp was found in the ascending colon; polypectomy was performed with a cold snare 2.   Moderate diverticulosis was noted in the left colon 3.   The colon mucosa was otherwise normal  RECOMMENDATIONS: 1.  Follow up colonoscopy in 5 years 2.  Upper endoscopy today (see report)   eSigned:  Eustace Quail, MD 07/04/2013 3:38 PM   cc: The Patient and Unk Pinto, MD

## 2013-07-04 NOTE — Telephone Encounter (Signed)
On call note. Pt complains of mild LUQ crampy pain and some pain in left shoulder. Has not expelled much flatus post procedures this afternoon. Colon and egd reports reviewed. Presumed gas trapping causing symptoms. Advised warm liquids for next few hours and a heating pad as needed and call back if symptoms do not resolve overnight or if they worsen.

## 2013-07-04 NOTE — Patient Instructions (Signed)
YOU HAD AN ENDOSCOPIC PROCEDURE TODAY AT THE [facility]: Refer to the procedure report that was given to you for any specific questions about what was found during the examination.  If the procedure report does not answer your questions, please call your gastroenterologist to clarify.  If you requested that your care partner not be given the details of your procedure findings, then the procedure report has been included in a sealed envelope for you to review at your convenience later.  YOU SHOULD EXPECT: Some feelings of bloating in the abdomen. Passage of more gas than usual.  Walking can help get rid of the air that was put into your GI tract during the procedure and reduce the bloating. If you had a lower endoscopy (such as a colonoscopy or flexible sigmoidoscopy) you may notice spotting of blood in your stool or on the toilet paper. If you underwent a bowel prep for your procedure, then you may not have a normal bowel movement for a few days.  DIET: Your first meal following the procedure should be a light meal and then it is ok to progress to your normal diet.  A half-sandwich or bowl of soup is an example of a good first meal.  Heavy or fried foods are harder to digest and may make you feel nauseous or bloated.  Likewise meals heavy in dairy and vegetables can cause extra gas to form and this can also increase the bloating.  Drink plenty of fluids but you should avoid alcoholic beverages for 24 hours.  ACTIVITY: Your care partner should take you home directly after the procedure.  You should plan to take it easy, moving slowly for the rest of the day.  You can resume normal activity the day after the procedure however you should NOT DRIVE or use heavy machinery for 24 hours (because of the sedation medicines used during the test).    SYMPTOMS TO REPORT IMMEDIATELY: A gastroenterologist can be reached at any hour.  During normal business hours, 8:30 AM to 5:00 PM Monday through Friday,  call [phone].  After hours and on weekends, please call the GI answering service at [phone] who will take a message and have the physician on call contact you.   Following lower endoscopy (colonoscopy or flexible sigmoidoscopy):  Excessive amounts of blood in the stool  Significant tenderness or worsening of abdominal pains  Swelling of the abdomen that is new, acute  Fever of 100F or higher  Following upper endoscopy (EGD)  Vomiting of blood or coffee ground material  New chest pain or pain under the shoulder blades  Painful or persistently difficult swallowing  New shortness of breath  Fever of 100F or higher  Black, tarry-looking stools  FOLLOW UP: If any biopsies were taken you will be contacted by phone or by letter within the next 1-3 weeks.  Call your gastroenterologist if you have not heard about the biopsies in 3 weeks.  Our staff will call the home number listed on your records the next business day following your procedure to check on you and address any questions or concerns that you may have at that time regarding the information given to you following your procedure. This is a courtesy call and so if there is no answer at the home number and we have not heard from you through the emergency physician on call, we will assume that you have returned to your regular daily activities without incident.  SIGNATURES/CONFIDENTIALITY: You and/or your care   partner have signed paperwork which will be entered into your electronic medical record.  These signatures attest to the fact that that the information above on your After Visit Summary has been reviewed and is understood.  Full responsibility of the confidentiality of this discharge information lies with you and/or your care-partner.   Resume medications. Information given on polyps, diverticulosis and high fiber diet with discharge instructions. 

## 2013-07-04 NOTE — Progress Notes (Signed)
Report to pacu rn, vss, bbs=clear 

## 2013-07-04 NOTE — Op Note (Signed)
[place]  [address]. [place], [address]   ENDOSCOPY PROCEDURE REPORT  PATIENT: Mccayla, Shimada  MR#: [id number] BIRTHDATE: 04/07/1945 , 67  yrs. old GENDER: Female ENDOSCOPIST: Eustace Quail, MD REFERRED BY:  .  Self / Office PROCEDURE DATE:  07/04/2013 PROCEDURE:  EGD, diagnostic ASA CLASS:     Class II INDICATIONS:  Dyspepsia.   Dysphagia. MEDICATIONS: MAC sedation, administered by CRNA and propofol (Diprivan) 70mg  IV TOPICAL ANESTHETIC: none  DESCRIPTION OF PROCEDURE: After the risks benefits and alternatives of the procedure were thoroughly explained, informed consent was obtained.  The LB [id number] endoscope was introduced through the mouth and advanced to the second portion of the duodenum. Without limitations.  The instrument was slowly withdrawn as the mucosa was fully examined.     EXAM: The upper, middle and distal third of the esophagus were carefully inspected and no abnormalities were noted.  The z-line was well seen at the GEJ.  The endoscope was pushed into the fundus which was normal including a retroflexed view.  The antrum, gastric body, first and second part of the duodenum were unremarkable. Retroflexed views revealed no abnormalities.     The scope was then withdrawn from the patient and the procedure completed.  COMPLICATIONS: There were no complications. ENDOSCOPIC IMPRESSION: 1. Normal EGD  RECOMMENDATIONS: 1.  Prilosec OTC 20 mg daily  REPEAT EXAM:  eSigned:  Eustace Quail, MD 07/04/2013 3:42 PM   FB:PZWCHEN Melford Aase, MD and The Patient

## 2013-07-04 NOTE — Progress Notes (Signed)
Called to room to assist during endoscopic procedure.  Patient ID and intended procedure confirmed with present staff. Received instructions for my participation in the procedure from the performing physician.  

## 2013-07-05 ENCOUNTER — Telehealth: Payer: Self-pay | Admitting: *Deleted

## 2013-07-05 NOTE — Telephone Encounter (Signed)
Left message that we called for f/u 

## 2013-07-09 ENCOUNTER — Telehealth: Payer: Self-pay | Admitting: Cardiology

## 2013-07-09 ENCOUNTER — Encounter: Payer: Self-pay | Admitting: Internal Medicine

## 2013-07-09 NOTE — Telephone Encounter (Signed)
New message    Patient has general questions   &  C/O heart palpation night.

## 2013-07-10 ENCOUNTER — Encounter: Payer: Self-pay | Admitting: *Deleted

## 2013-07-10 ENCOUNTER — Encounter (INDEPENDENT_AMBULATORY_CARE_PROVIDER_SITE_OTHER): Payer: Medicare HMO

## 2013-07-10 DIAGNOSIS — R002 Palpitations: Secondary | ICD-10-CM

## 2013-07-10 DIAGNOSIS — R079 Chest pain, unspecified: Secondary | ICD-10-CM

## 2013-07-10 NOTE — Progress Notes (Signed)
Patient ID: Andrea Bates, female   DOB: 06-13-1945, 68 y.o.   MRN: [id number] EVO 48 hour holter monitor applied to patient.

## 2013-07-12 NOTE — Telephone Encounter (Signed)
LMTCB 07/09/13

## 2013-07-13 ENCOUNTER — Ambulatory Visit (HOSPITAL_BASED_OUTPATIENT_CLINIC_OR_DEPARTMENT_OTHER): Payer: Medicare HMO | Admitting: Radiology

## 2013-07-13 ENCOUNTER — Ambulatory Visit (HOSPITAL_COMMUNITY): Payer: Medicare HMO | Attending: Cardiology

## 2013-07-13 DIAGNOSIS — R079 Chest pain, unspecified: Secondary | ICD-10-CM | POA: Insufficient documentation

## 2013-07-13 DIAGNOSIS — R002 Palpitations: Secondary | ICD-10-CM

## 2013-07-13 NOTE — Progress Notes (Signed)
Stress Echocardiogram performed.  

## 2013-07-18 NOTE — Telephone Encounter (Signed)
Discussed monitor results with patient. Pt is going to try Calan 80mg  hs (given to her by Dr Melford Aase, but she has not tried) to see if this helps her palpitations.

## 2013-07-18 NOTE — Telephone Encounter (Signed)
Follow up ° ° ° ° ° °Returning the nurses call ° ° ° ° ° ° °

## 2013-07-18 NOTE — Telephone Encounter (Signed)
Dr Aundra Dubin reviewed monitor done 07/10/13--  Predominantly NSR There were occasional PVCs and PACs 19 beat run SVT(has been in the past). Nothing worrisome. She has not tolerated CCBs or BBs in the past.   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

## 2013-08-03 ENCOUNTER — Encounter: Payer: Self-pay | Admitting: Physician Assistant

## 2013-08-03 ENCOUNTER — Ambulatory Visit (INDEPENDENT_AMBULATORY_CARE_PROVIDER_SITE_OTHER): Payer: Medicare HMO | Admitting: Physician Assistant

## 2013-08-03 VITALS — BP 110/68 | HR 64 | Temp 98.7°F | Resp 16 | Wt 100.0 lb

## 2013-08-03 DIAGNOSIS — M542 Cervicalgia: Secondary | ICD-10-CM

## 2013-08-03 DIAGNOSIS — R109 Unspecified abdominal pain: Secondary | ICD-10-CM

## 2013-08-03 DIAGNOSIS — R0602 Shortness of breath: Secondary | ICD-10-CM

## 2013-08-03 DIAGNOSIS — R634 Abnormal weight loss: Secondary | ICD-10-CM

## 2013-08-03 MED ORDER — CYCLOBENZAPRINE HCL 10 MG PO TABS: 10.0000 mg | ORAL_TABLET | Freq: Three times a day (TID) | ORAL | Status: DC | PRN

## 2013-08-03 MED ORDER — VERAPAMIL HCL 40 MG PO TABS: 40.0000 mg | ORAL_TABLET | Freq: Two times a day (BID) | ORAL | Status: DC

## 2013-08-03 NOTE — Progress Notes (Signed)
Subjective:    Patient ID: Andrea Bates, female    DOB: 02/11/1946, 68 y.o.   MRN: [id number]  HPI 68 y.o. with history of anxiety presents with a multitude of symptoms. She complains of shortness of breath with exertion and rest, rapid heart beats worse with eating or walking. Occasional unprovoked dizziness, ears feeling full, a throat tightness. With food she becomes very full, hears a "gurgling" and has an epigastric and chest tightness.  She has had an extensive work up with Dr. Henrene Pastor, with a recent normal EGD and colonoscopy, and seeing Dr. Aundra Dubin had normal Echo with diastolic dysfunction.  She was put on omega Q plus, and cardio plus and magnesium. She was suppose to be on prilosec after the EGD but never did this.   She also complains of having very vivid dreams at night and wakes up frequently with her heart rate up to 90's. Admits to being a very nervous person but does not like to take medications.   Filed Vitals:   08/03/13 1016  BP: 110/68  Pulse: 64  Temp: 98.7 F (37.1 C)  Resp: 16    Current Outpatient Prescriptions on File Prior to Visit  Medication Sig Dispense Refill  . ALPRAZolam (XANAX) 0.25 MG tablet Take 1 tablet (0.25 mg total) by mouth 3 (three) times daily as needed for anxiety.  30 tablet  0  . Calcium Carbonate-Vitamin D (CALCIUM + D PO) Take 1 tablet by mouth daily.       . cholecalciferol (VITAMIN D) 1000 UNITS tablet Take 4,000 Units by mouth daily.       Marland Kitchen ezetimibe (ZETIA) 10 MG tablet Take 1 tablet (10 mg total) by mouth daily. For Cholesterol  90 tablet  99  . fish oil-omega-3 fatty acids 1000 MG capsule Take 2 g by mouth.       . Flaxseed, Linseed, (FLAX SEED OIL) 1000 MG CAPS Take by mouth daily.        . fluticasone (FLONASE) 50 MCG/ACT nasal spray Place 2 sprays into the nose daily.      Marland Kitchen OVER THE COUNTER MEDICATION Sunflower leicthin 1 daily for cholesterol      . SYNTHROID 75 MCG tablet TAKE 1 TABLET BY MOUTH EVERY DAY  30 tablet  6  .  verapamil (CALAN) 80 MG tablet Take 1/2 to 1 tablet 2 or 3 x daily for palpitations - TAKE WITH FOOD  90 tablet  1   No current facility-administered medications on file prior to visit.    Review of Systems  Constitutional: Positive for appetite change, fatigue and unexpected weight change (patient states weight loss). Negative for fever, chills, diaphoresis and activity change.  HENT: Positive for ear pain.   Eyes: Negative.   Respiratory: Positive for chest tightness and shortness of breath. Negative for apnea, cough, wheezing and stridor.   Cardiovascular: Positive for chest pain and palpitations. Negative for leg swelling.  Gastrointestinal: Positive for nausea (beltching), abdominal pain and abdominal distention. Negative for vomiting, diarrhea, constipation, blood in stool, anal bleeding and rectal pain.  Genitourinary: Negative.   Musculoskeletal: Negative.   Skin: Negative.   Neurological: Positive for dizziness and headaches. Negative for tremors, seizures, syncope, facial asymmetry, speech difficulty, weakness, light-headedness and numbness.  Psychiatric/Behavioral: The patient is nervous/anxious.        Objective:   Physical Exam  Constitutional: She is oriented to person, place, and time. She appears well-developed and well-nourished.  HENT:  Head: Normocephalic and atraumatic.  Right  Ear: External ear normal.  Left Ear: External ear normal.  Nose: Nose normal.  Mouth/Throat: Oropharynx is clear and moist.  + TMJ tenderness left more than right  Eyes: Conjunctivae and EOM are normal. Pupils are equal, round, and reactive to light.  Neck: Normal range of motion. Neck supple.  Cardiovascular: Normal rate, regular rhythm and normal heart sounds.  Exam reveals no gallop and no friction rub.   No murmur heard. Pulmonary/Chest: Effort normal and breath sounds normal. No respiratory distress. She has no wheezes. She has no rales. She exhibits no tenderness.  Abdominal: Soft.  Bowel sounds are normal. She exhibits no distension and no mass. There is tenderness (epigastric). There is no rebound and no guarding.  Musculoskeletal: Normal range of motion.  Lymphadenopathy:    She has no cervical adenopathy.  Neurological: She is alert and oriented to person, place, and time. No cranial nerve deficit.  Skin: Skin is warm and dry. No rash noted.  Psychiatric: Her mood appears anxious.       Assessment & Plan:  Anxiety- declines anxiety medication at this time but I had a very long discussion with the patient and her husband how a lot of her symptoms could be contributed to this. At this time she disagrees. We will proceed with other treatments and she will follow up 1 month.   Palpitations- has had a neg heart work up, ? From GERD vs SVT- take dexilant and given verapamil which she never took when prescribed in the past- will try this time  AB pain- Epigastric pain, weight loss per patient- we will get a CT AB to rule out cancer.   CT nodule- history of nodule with no follow up- with SOB and symptoms we will get CT chest.   GERD- explained asthma induced GERD, esophageal spasm- will do Dexilant 2-3 weeks.   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 Given flexeril.

## 2013-08-03 NOTE — Patient Instructions (Addendum)
dexilant do for 2-3 weeks Can do flexeril at night as needed for jaw pain Can try verapamil 1-2 at night for 1-2 weeks.   What is the TMJ? The temporomandibular (tem-PUH-ro-man-DIB-yoo-ler) joint, or the TMJ, connects the upper and lower jawbones. This joint allows the jaw to open wide and move back and forth when you chew, talk, or yawn.There are also several muscles that help this joint move. There can be muscle tightness and pain in the muscle that can cause several symptoms.  What causes TMJ pain? There are many causes of TMJ pain. Repeated chewing (for example, chewing gum) and clenching your teeth can cause pain in the joint. Some TMJ pain has no obvious cause. What can I do to ease the pain? There are many things you can do to help your pain get better. When you have pain:  Eat soft foods and stay away from chewy foods (for example, taffy) Try to use both sides of your mouth to chew Don't chew gum Don't open your mouth wide (for example, during yawning or singing) Don't bite your cheeks or fingernails Lower your amount of stress and worry Applying a warm, damp washcloth to the joint may help. Over-the-counter pain medicines such as ibuprofen (one brand: Advil) or acetaminophen (one brand: Tylenol) might also help. Do not use these medicines if you are allergic to them or if your doctor told you not to use them. How can I stop the pain from coming back? When your pain is better, you can do these exercises to make your muscles stronger and to keep the pain from coming back:  Resisted mouth opening: Place your thumb or two fingers under your chin and open your mouth slowly, pushing up lightly on your chin with your thumb. Hold for three to six seconds. Close your mouth slowly. Resisted mouth closing: Place your thumbs under your chin and your two index fingers on the ridge between your mouth and the bottom of your chin. Push down lightly on your chin as you close your mouth. Tongue up:  Slowly open and close your mouth while keeping the tongue touching the roof of the mouth. Side-to-side jaw movement: Place an object about one fourth of an inch thick (for example, two tongue depressors) between your front teeth. Slowly move your jaw from side to side. Increase the thickness of the object as the exercise becomes easier Forward jaw movement: Place an object about one fourth of an inch thick between your front teeth and move the bottom jaw forward so that the bottom teeth are in front of the top teeth. Increase the thickness of the object as the exercise becomes easier. These exercises should not be painful. If it hurts to do these exercises, stop doing them and talk to your family doctor.

## 2013-08-09 ENCOUNTER — Ambulatory Visit
Admission: RE | Admit: 2013-08-09 | Discharge: 2013-08-09 | Disposition: A | Discharge status: Home / Self Care | Payer: Medicare HMO | Source: Ambulatory Visit | Attending: Physician Assistant | Admitting: Physician Assistant

## 2013-08-09 ENCOUNTER — Encounter: Payer: Self-pay | Admitting: Internal Medicine

## 2013-08-09 DIAGNOSIS — R634 Abnormal weight loss: Secondary | ICD-10-CM

## 2013-08-09 DIAGNOSIS — R109 Unspecified abdominal pain: Secondary | ICD-10-CM

## 2013-08-09 DIAGNOSIS — R0602 Shortness of breath: Secondary | ICD-10-CM

## 2013-09-03 ENCOUNTER — Encounter: Payer: Self-pay | Admitting: Physician Assistant

## 2013-09-03 ENCOUNTER — Telehealth: Payer: Self-pay | Admitting: Cardiology

## 2013-09-03 ENCOUNTER — Ambulatory Visit: Payer: Self-pay | Admitting: Physician Assistant

## 2013-09-03 ENCOUNTER — Ambulatory Visit (INDEPENDENT_AMBULATORY_CARE_PROVIDER_SITE_OTHER): Payer: Medicare HMO | Admitting: Physician Assistant

## 2013-09-03 VITALS — BP 128/68 | HR 76 | Temp 99.0°F | Resp 16 | Wt 103.0 lb

## 2013-09-03 DIAGNOSIS — R002 Palpitations: Secondary | ICD-10-CM

## 2013-09-03 DIAGNOSIS — E039 Hypothyroidism, unspecified: Secondary | ICD-10-CM

## 2013-09-03 DIAGNOSIS — F411 Generalized anxiety disorder: Secondary | ICD-10-CM

## 2013-09-03 DIAGNOSIS — I776 Arteritis, unspecified: Secondary | ICD-10-CM

## 2013-09-03 LAB — COMPREHENSIVE METABOLIC PANEL
ALBUMIN: 4.6 g/dL (ref 3.5–5.2)
ALK PHOS: 96 U/L (ref 39–117)
ALT: 22 U/L (ref 0–35)
AST: 21 U/L (ref 0–37)
BUN: 14 mg/dL (ref 6–23)
CALCIUM: 9.8 mg/dL (ref 8.4–10.5)
CHLORIDE: 100 meq/L (ref 96–112)
CO2: 29 mEq/L (ref 19–32)
Creat: 0.66 mg/dL (ref 0.50–1.10)
GLUCOSE: 84 mg/dL (ref 70–99)
Potassium: 4.2 mEq/L (ref 3.5–5.3)
SODIUM: 137 meq/L (ref 135–145)
TOTAL PROTEIN: 6.8 g/dL (ref 6.0–8.3)
Total Bilirubin: 0.5 mg/dL (ref 0.2–1.2)

## 2013-09-03 LAB — CBC WITH DIFFERENTIAL/PLATELET
Basophils Absolute: 0 10*3/uL (ref 0.0–0.1)
Basophils Relative: 0 % (ref 0–1)
Eosinophils Absolute: 0.1 10*3/uL (ref 0.0–0.7)
Eosinophils Relative: 1 % (ref 0–5)
HEMATOCRIT: 37.4 % (ref 36.0–46.0)
HEMOGLOBIN: 12.7 g/dL (ref 12.0–15.0)
LYMPHS PCT: 24 % (ref 12–46)
Lymphs Abs: 1.8 10*3/uL (ref 0.7–4.0)
MCH: 29 pg (ref 26.0–34.0)
MCHC: 34 g/dL (ref 30.0–36.0)
MCV: 85.4 fL (ref 78.0–100.0)
MONO ABS: 0.6 10*3/uL (ref 0.1–1.0)
MONOS PCT: 8 % (ref 3–12)
NEUTROS PCT: 67 % (ref 43–77)
Neutro Abs: 5 10*3/uL (ref 1.7–7.7)
Platelets: 355 10*3/uL (ref 150–400)
RBC: 4.38 MIL/uL (ref 3.87–5.11)
RDW: 14.1 % (ref 11.5–15.5)
WBC: 7.5 10*3/uL (ref 4.0–10.5)

## 2013-09-03 MED ORDER — VERAPAMIL HCL 40 MG PO TABS: 40.0000 mg | ORAL_TABLET | Freq: Two times a day (BID) | ORAL | Status: DC

## 2013-09-03 NOTE — Telephone Encounter (Signed)
New Message:  Pt is requesting to be worked in to see Dr. Aundra Dubin. Pt does not want to wait for the next available with the PA or [facility].

## 2013-09-03 NOTE — Patient Instructions (Signed)
Try verapamil 1/2-1 for palpitations Stop the thyroid medication and start to tirosint 66mcg, can go up to 61mcg where i think you will do well at.   Hypothyroidism The thyroid is a large gland located in the lower front of your neck. The thyroid gland helps control metabolism. Metabolism is how your body handles food. It controls metabolism with the hormone thyroxine. When this gland is underactive (hypothyroid), it produces too little hormone.  CAUSES These include:   Absence or destruction of thyroid tissue.  Goiter due to iodine deficiency.  Goiter due to medications.  Congenital defects (since birth).  Problems with the pituitary. This causes a lack of TSH (thyroid stimulating hormone). This hormone tells the thyroid to turn out more hormone. SYMPTOMS  Lethargy (feeling as though you have no energy)  Cold intolerance  Weight gain (in spite of normal food intake)  Dry skin  Coarse hair  Menstrual irregularity (if severe, may lead to infertility)  Slowing of thought processes Cardiac problems are also caused by insufficient amounts of thyroid hormone. Hypothyroidism in the newborn is cretinism, and is an extreme form. It is important that this form be treated adequately and immediately or it will lead rapidly to retarded physical and mental development. DIAGNOSIS  To prove hypothyroidism, your caregiver may do blood tests and ultrasound tests. Sometimes the signs are hidden. It may be necessary for your caregiver to watch this illness with blood tests either before or after diagnosis and treatment. TREATMENT  Low levels of thyroid hormone are increased by using synthetic thyroid hormone. This is a safe, effective treatment. It usually takes about four weeks to gain the full effects of the medication. After you have the full effect of the medication, it will generally take another four weeks for problems to leave. Your caregiver may start you on low doses. If you have had heart  problems the dose may be gradually increased. It is generally not an emergency to get rapidly to normal. HOME CARE INSTRUCTIONS   Take your medications as your caregiver suggests. Let your caregiver know of any medications you are taking or start taking. Your caregiver will help you with dosage schedules.  As your condition improves, your dosage needs may increase. It will be necessary to have continuing blood tests as suggested by your caregiver.  Report all suspected medication side effects to your caregiver. SEEK MEDICAL CARE IF: Seek medical care if you develop:  Sweating.  Tremulousness (tremors).  Anxiety.  Rapid weight loss.  Heat intolerance.  Emotional swings.  Diarrhea.  Weakness. SEEK IMMEDIATE MEDICAL CARE IF:  You develop chest pain, an irregular heart beat (palpitations), or a rapid heart beat. MAKE SURE YOU:   Understand these instructions.  Will watch your condition.  Will get help right away if you are not doing well or get worse. Document Released: 03/08/2005 Document Revised: 05/31/2011 Document Reviewed: 10/27/2007 [facility] Patient Information 2014 [place].

## 2013-09-03 NOTE — Progress Notes (Signed)
   Subjective:    Patient ID: Andrea Bates, female    DOB: [birth year], 68 y.o.   MRN: [id number]  HPI 68 y.o. Female with history of anxiety presents with a multitude of symptoms. She complains of shortness of breath with exertion and rest, rapid heart beats worse with eating or walking. Occasional unprovoked dizziness, ears feeling full, a throat tightness. With food she becomes very full, hears a "gurgling" and has an epigastric and chest tightness. She has had an extensive work up with Dr. Henrene Pastor, with a recent normal EGD and colonoscopy, and seeing Dr. Aundra Dubin had normal Echo with diastolic dysfunction. She was put on omega Q plus, and cardio plus and magnesium. She was suppose to be on prilosec after the EGD but never did this. She states with walking around she gets a heaviness in her epigastric area and occ middle back pain, happens with and without food. She also states it feels like there is a "flutter" in her epigastric area.   She has take all of the dexilant and states it did not help. She stopped her thyroid medication for a week and a half and states that the palpitations decreased, BP decreased, decreased night sweats. She started it back in the past week but only on 1/2.    Review of Systems  Constitutional: Positive for appetite change, fatigue and unexpected weight change (patient states weight loss). Negative for fever, chills, diaphoresis and activity change.  HENT: Positive for ear pain.   Eyes: Negative.   Respiratory: Positive for chest tightness and shortness of breath. Negative for apnea, cough, wheezing and stridor.   Cardiovascular: Positive for chest pain and palpitations. Negative for leg swelling.  Gastrointestinal: Positive for nausea (beltching), abdominal pain and abdominal distention. Negative for vomiting, diarrhea, constipation, blood in stool, anal bleeding and rectal pain.  Genitourinary: Negative.   Musculoskeletal: Negative.   Skin: Negative.    Neurological: Positive for dizziness and headaches. Negative for tremors, seizures, syncope, facial asymmetry, speech difficulty, weakness, light-headedness and numbness.  Psychiatric/Behavioral: The patient is nervous/anxious.        Objective:   Physical Exam  Constitutional: She is oriented to person, place, and time. She appears well-developed and well-nourished.  HENT:  Head: Normocephalic and atraumatic.  Right Ear: External ear normal.  Left Ear: External ear normal.  Nose: Nose normal.  Mouth/Throat: Oropharynx is clear and moist.  + TMJ tenderness left more than right  Eyes: Conjunctivae and EOM are normal. Pupils are equal, round, and reactive to light.  Neck: Normal range of motion. Neck supple.  Cardiovascular: Normal rate, regular rhythm and normal heart sounds.  Exam reveals no gallop and no friction rub.   No murmur heard. Pulmonary/Chest: Effort normal and breath sounds normal. No respiratory distress. She has no wheezes. She has no rales. She exhibits no tenderness.  Abdominal: Soft. Bowel sounds are normal. She exhibits no distension and no mass. There is tenderness (epigastric). There is no rebound and no guarding.  Musculoskeletal: Normal range of motion.  Lymphadenopathy:    She has no cervical adenopathy.  Neurological: She is alert and oriented to person, place, and time. No cranial nerve deficit.  Skin: Skin is warm and dry. No rash noted.  Psychiatric: Her mood appears anxious.      Assessment & Plan:  Palpitations/flushing/TSH-? Will switch to tirosint 25-->51mcg, add verapamil, follow up with cardio ? Vasculitis- ANCA, ANA, complement Anxiety- suggest medications, she refuses.

## 2013-09-04 LAB — ANA: ANA: NEGATIVE

## 2013-09-04 LAB — C3 AND C4
C3 COMPLEMENT: 111 mg/dL (ref 90–180)
C4 Complement: 17 mg/dL (ref 10–40)

## 2013-09-04 LAB — TSH: TSH: 2.238 u[IU]/mL (ref 0.350–4.500)

## 2013-09-04 NOTE — Telephone Encounter (Signed)
LMTCB

## 2013-09-05 ENCOUNTER — Encounter: Payer: Self-pay | Admitting: Internal Medicine

## 2013-09-05 LAB — ANCA SCREEN W REFLEX TITER
Atypical p-ANCA Screen: NEGATIVE
C-ANCA SCREEN: NEGATIVE
P-ANCA SCREEN: NEGATIVE

## 2013-09-05 LAB — COMPLEMENT, TOTAL: Compl, Total (CH50): 59 U/mL (ref 31–60)

## 2013-09-06 ENCOUNTER — Ambulatory Visit: Payer: Self-pay | Admitting: Physician Assistant

## 2013-09-10 NOTE — Telephone Encounter (Signed)
Pt states PCP PA advised her to schedule an appt with Dr Aundra Dubin. Pt complains of a bee feeling/sensation in her stomach. She also complains that when she raises her leg it makes her have palpitations. I offered pt appt with Dr Aundra Dubin 09/20/13 but she is going to be out of town next week. I have given her an appt with Dr Aundra Dubin 10/02/13. Pt advised to report to ED for worsening/ change in symptoms.

## 2013-09-10 NOTE — Telephone Encounter (Signed)
Follow up   Pt is at her job till 3:15pm call work phone @ 3:45 pm call cell when calling back if you still do not get her please try home number. Pt is requesting to be worked in to see Dr. Aundra Dubin. Pt does not want to wait for the next available with the PA or [facility].

## 2013-09-11 ENCOUNTER — Ambulatory Visit (INDEPENDENT_AMBULATORY_CARE_PROVIDER_SITE_OTHER): Payer: Medicare HMO | Admitting: Internal Medicine

## 2013-09-11 ENCOUNTER — Encounter: Payer: Self-pay | Admitting: Internal Medicine

## 2013-09-11 VITALS — BP 110/78 | HR 80 | Ht 62.0 in | Wt 102.0 lb

## 2013-09-11 DIAGNOSIS — K219 Gastro-esophageal reflux disease without esophagitis: Secondary | ICD-10-CM

## 2013-09-11 DIAGNOSIS — R1084 Generalized abdominal pain: Secondary | ICD-10-CM

## 2013-09-11 DIAGNOSIS — K589 Irritable bowel syndrome without diarrhea: Secondary | ICD-10-CM

## 2013-09-11 MED ORDER — HYOSCYAMINE SULFATE 0.125 MG SL SUBL: SUBLINGUAL_TABLET | SUBLINGUAL | Status: DC

## 2013-09-11 NOTE — Progress Notes (Signed)
HISTORY OF PRESENT ILLNESS:  Andrea Bates is a 68 y.o. female with past medical history as listed below and a history of chronic somatic complaints who presents today with multiple complaints. Last evaluated in the office March 2015 regarding abdominal pain, change in bowel habits, dysphagia, and a history of colonic adenomas. She underwent complete colonoscopy and upper endoscopy 07/04/2013. Colonoscopy revealed moderate left-sided diverticulosis and a diminutive adenoma which was removed. Followup in 5 years recommended. Upper endoscopy was entirely normal. She was to continue on Prilosec for GERD. She states that she does not tolerate Prilosec. She is now taking over-the-counter Prevacid with some pyrosis at night. She saw her primary provider and because of ongoing somatic complaints she underwent a CT scan of the chest abdomen and pelvis with no significant abnormalities found. She tells me now that her bowel habits can be irregular at times. Complains of sharp fleeting abdominal discomfort which might be exacerbated by movements. She complains of palpitations, night sweats, and next thousand. She has undergone recent cardiac evaluation which shows been favorable. We discussed increasing fiber, this results in excessive gas and bloating.  REVIEW OF SYSTEMS:  All non-GI ROS negative except for palpitations, night sweats, sinus and allergy troubles  Past Medical History  Diagnosis Date  . Diverticulosis   . Hiatal hernia   . Hypothyroidism   . Personal history of colonic polyps     a. colonoscopy 2009  . Personal history of malignant neoplasm of breast   . Hemorrhoids   . Hyperlipidemia   . Palpitations     a. 04/2008 echo: EF 50-60%;  b. 21 day event monitor 2012 - occasional sinus tachycardia with rates 110-120's. Rare PAC's/PVC's. Serveral short 4-5 beat runs of a-tach.  . Irritable bowel syndrome   . Hyperlipidemia     Is now taking Fish oil. Had myalgias with Vytorin and Lipitor  .  GERD (gastroesophageal reflux disease)     a. mild esophagitis on egd 2012  . Pre-diabetes   . Chest pain     a. negative myoview 2011 [name]);  b. 04/2010 Cath: normal cors    Past Surgical History  Procedure Laterality Date  . Nasal sinus surgery    . Tonsillectomy    . Carpal tunnel release      bilateral   . Colonoscopy    . Bunionectomy      Social History Andrea Bates  reports that she has never smoked. She has never used smokeless tobacco. She reports that she does not drink alcohol or use illicit drugs.  family history includes Colon cancer in an other family member; Diabetes in her paternal grandmother; Heart defect in her father.  Allergies  Allergen Reactions  . Armour Thyroid [Thyroid]     Palpitations   . Atenolol     presyncope  . Celexa [Citalopram Hydrobromide]     Severe chest tightness   . Diltiazem Hcl     Dropped bp  . Lipitor [Atorvastatin]     Myalgias   . Propranolol Hcl     Felt bad  . Vytorin [Ezetimibe-Simvastatin]     Myalgias        PHYSICAL EXAMINATION: Vital signs: BP 110/78  Pulse 80  Ht 5\' 2"  (1.575 m)  Wt 102 lb (46.267 kg)  BMI 18.65 kg/m2 General: Well-developed, well-nourished, no acute distress HEENT: Sclerae are anicteric, conjunctiva pink. Oral mucosa intact Lungs: Clear Heart: Regular Abdomen: soft, nontender, nondistended, no obvious ascites, no peritoneal signs, normal bowel sounds.  No organomegaly. Extremities: No edema Psychiatric: Pleasant, anxious, alert and oriented x3. Cooperative   ASSESSMENT:  #1. GERD. Some breakthrough symptoms on low-dose Prevacid. Negative recent EGD #2. Functional abdominal complaints. Ongoing. Negative extensive evaluations #3. History of colonic adenoma. Last colonoscopy April 2015 #4. Health related anxiety. Ongoing  PLAN:  #1. Reflux precautions #2. Increase Prevacid OTC to twice a day for breakthrough symptoms #3. Levsin sublingual prescribed. Use as needed for  sharp pains in the abdomen #4. Reassurance #5. Surveillance colonoscopy April 2020. #6. Interval followup as needed. Return to the care of your PCP

## 2013-09-11 NOTE — Patient Instructions (Signed)
We have sent the following medications to your pharmacy for you to pick up at your convenience:  Levsin  Increase your Prevacid as needed for reflux.  Please follow up with Dr. Henrene Pastor as needed

## 2013-09-25 ENCOUNTER — Encounter: Payer: Self-pay | Admitting: Physician Assistant

## 2013-10-01 ENCOUNTER — Ambulatory Visit: Payer: Self-pay | Admitting: Internal Medicine

## 2013-10-02 ENCOUNTER — Encounter: Payer: Self-pay | Admitting: Cardiology

## 2013-10-02 ENCOUNTER — Ambulatory Visit (INDEPENDENT_AMBULATORY_CARE_PROVIDER_SITE_OTHER): Payer: Medicare HMO | Admitting: Cardiology

## 2013-10-02 VITALS — BP 120/56 | HR 77 | Ht 62.0 in | Wt 106.0 lb

## 2013-10-02 DIAGNOSIS — E785 Hyperlipidemia, unspecified: Secondary | ICD-10-CM

## 2013-10-02 DIAGNOSIS — R002 Palpitations: Secondary | ICD-10-CM

## 2013-10-02 MED ORDER — PROPRANOLOL HCL 40 MG PO TABS: 40.0000 mg | ORAL_TABLET | Freq: Two times a day (BID) | ORAL | Status: DC

## 2013-10-02 NOTE — Patient Instructions (Signed)
Take propranolol 40mg  two times a day.  Your physician wants you to follow-up in: 6 months with Dr Aundra Dubin. (January 2016).  You will receive a reminder letter in the mail two months in advance. If you don't receive a letter, please call our office to schedule the follow-up appointment.

## 2013-10-03 NOTE — Progress Notes (Signed)
Patient ID: Andrea Bates, female   DOB: [birth year], 68 y.o.   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 She underwent cardiac catheterization in 2/12 which showed normal coronaries and normal LV systolic function.  She has had a lot of problems with symptomatic PACs and PVCs in the past.  She has been intolerant of both beta blockers and calcium channel blockers.  She had a stress echo in 4/15 with 12' exercise, no evidence for ischemia or infarction.   Recently, she has felt a pounding sensation in her left upper quadrant that causes her abdomen to "vibrate."  She feels like it is her heart beating.  It is very bothersome.  She has seen GI and was told that she has irritable bowel syndrome.  She had an abdominal CT in 5/15 that showed a normal caliber abdominal aorta.  She is occasionally short of breath walking but is able to walk for 15 minutes at a fast pace on a treadmill with no problems.  No chest pain.   Labs (2/12): TSH, free T4, free T3 all normal. K 3.9, creatinine 0.62.  Labs (3/12): K 4.8, creatinine 0.6, Mg 2.5, urine catecholeamines normal  Labs (3/15): LDL 180 Labs (6/15): K 4.2, creatinine 0.66, TSH normal  Allergies (verified):  1) * Shellfish   Past Medical History:  1. Hyperlipidemia: She had myalgias with Vytorin and Lipitor.  She had vertigo with Crestor.  2. Hypothyroidism.  3. Diverticulosis.  4. Anxiety.  5. IBS  6. Colonic polyps. ADENOMATOUS-LAST COLON 11/2007  7. GERD, esophagitis on EGD 8. Chest pain: Myoview at [facility] in 2011 was normal. LHC (2/12) showed normal coronaries, EF 65%.  Stress echo (4/15) with 12' exercise, no evidence for ischemia or infarction.  9. Echo (2/10): EF 38-10%, grade I diastolic dysfunction  10. Palpitations: 3 week event monitor (2/12) with occasional sinus tachycardia with rates 110s-120s. Rare  PACs/PVCs. Several short 4-5 beat runs of atrial tachycardia.  3-week event monitor in 6/13 showed occasioanl PVCs and PACs.  11. Sleep study normal 2/12  12. Allergic rhinitis 13. Diverticulosis  Family History:  Father with h/o 2 heart valve replacements. He also had a stroke. Brother with MI at age 74.  Family History of Colon Cancer:Great Uncle on Paternal Side  grandother had a goiter.   Social History:  Lives with husband in [place]. Works in a Special educational needs teacher. Never smoked. No ETOH.   ROS:  All systems reviewed and negative except as per HPI.    Current Outpatient Prescriptions  Medication Sig Dispense Refill  . ALPRAZolam (XANAX) 0.25 MG tablet Take 0.25 mg by mouth as needed for anxiety (takes 1/3 of tablet).      . Calcium Carbonate-Vitamin D (CALCIUM + D PO) Take 1 tablet by mouth daily.       . cholecalciferol (VITAMIN D) 1000 UNITS tablet Take 4,000 Units by mouth daily.       . Coenzyme Q10 (COQ10) 100 MG CAPS Take 100 mg by mouth 3 (three) times daily.      . fish oil-omega-3 fatty acids 1000 MG capsule Take 2 g by mouth.       . Flaxseed, Linseed, (FLAX SEED OIL) 1000 MG CAPS Take by mouth daily.        . fluticasone (FLONASE) 50 MCG/ACT nasal spray Place 2 sprays into the nose daily.      Marland Kitchen  hyoscyamine (LEVSIN SL) 0.125 MG SL tablet Take 1-2 tablets sublingually every 4 hours as needed for abdominal pain.  30 tablet  0  . MAGNESIUM PO Take by mouth daily.      Marland Kitchen OVER THE COUNTER MEDICATION Sunflower leicthin 1 daily for cholesterol      . SYNTHROID 75 MCG tablet TAKE 1 TABLET BY MOUTH EVERY DAY  30 tablet  6  . propranolol (INDERAL) 40 MG tablet Take 1 tablet (40 mg total) by mouth 2 (two) times daily.  60 tablet  6   No current facility-administered medications for this visit.    BP 120/56  Pulse 77  Ht 5\' 2"  (1.575 m)  Wt 106 lb (48.081 kg)  BMI 19.38 kg/m2  SpO2 97% General: NAD Neck: No JVD, no thyromegaly or thyroid nodule.  Lungs: Clear to auscultation  bilaterally with normal respiratory effort. CV: Nondisplaced PMI.  Heart regular S1/S2, no S3/S4, no murmur.  No peripheral edema.  No carotid bruit.  Normal pedal pulses.  Abdomen: Soft, nontender, no hepatosplenomegaly, no distention.  Neurologic: Alert and oriented x 3.  Psych: Anxious Extremities: No clubbing or cyanosis.   Assessment/Plan: 1. Palpitations: She has a sensation in her upper abdomen frequently that feels like her heart beating.  She has known PACs, short runs of atrial tachycardia.  She has had trouble tolerating beta blockers or CCBs. As her symptoms are quite bothersome, I asked her to try propranolol at a low dose, 40 mg bid. TSH in 6/15 was normal.  2. CAD risk: Family history of premature CAD.  Normal cardiac cath in 2/12 and normal stress echo in 4/15.  She cannot tolerate statins.  I would like her to try Zetia 10 mg daily given very high LDL.  She has started lecithin and wants to see if this affects her cholesterol before trying Zetia.    Loralie Champagne 10/03/2013

## 2013-10-16 ENCOUNTER — Ambulatory Visit (INDEPENDENT_AMBULATORY_CARE_PROVIDER_SITE_OTHER): Payer: Medicare HMO | Admitting: Internal Medicine

## 2013-10-16 ENCOUNTER — Encounter: Payer: Self-pay | Admitting: Internal Medicine

## 2013-10-16 VITALS — BP 120/66 | HR 80 | Temp 98.2°F | Resp 16 | Ht 62.0 in | Wt 107.8 lb

## 2013-10-16 DIAGNOSIS — E039 Hypothyroidism, unspecified: Secondary | ICD-10-CM

## 2013-10-16 NOTE — Patient Instructions (Addendum)
  Take Thyroxine 75 mg  --- 1/2 tablet = 37.5 mg daily as discussedHypothyroidism The thyroid is a large gland located in the lower front of your neck. The thyroid gland helps control metabolism. Metabolism is how your body handles food. It controls metabolism with the hormone thyroxine. When this gland is underactive (hypothyroid), it produces too little hormone.  CAUSES These include:   Absence or destruction of thyroid tissue.  Goiter due to iodine deficiency.  Goiter due to medications.  Congenital defects (since birth).  Problems with the pituitary. This causes a lack of TSH (thyroid stimulating hormone). This hormone tells the thyroid to turn out more hormone. SYMPTOMS  Lethargy (feeling as though you have no energy)  Cold intolerance  Weight gain (in spite of normal food intake)  Dry skin  Coarse hair  Menstrual irregularity (if severe, may lead to infertility)  Slowing of thought processes Cardiac problems are also caused by insufficient amounts of thyroid hormone. Hypothyroidism in the newborn is cretinism, and is an extreme form. It is important that this form be treated adequately and immediately or it will lead rapidly to retarded physical and mental development. DIAGNOSIS  To prove hypothyroidism, your caregiver may do blood tests and ultrasound tests. Sometimes the signs are hidden. It may be necessary for your caregiver to watch this illness with blood tests either before or after diagnosis and treatment. TREATMENT  Low levels of thyroid hormone are increased by using synthetic thyroid hormone. This is a safe, effective treatment. It usually takes about four weeks to gain the full effects of the medication. After you have the full effect of the medication, it will generally take another four weeks for problems to leave. Your caregiver may start you on low doses. If you have had heart problems the dose may be gradually increased. It is generally not an emergency to get  rapidly to normal. HOME CARE INSTRUCTIONS   Take your medications as your caregiver suggests. Let your caregiver know of any medications you are taking or start taking. Your caregiver will help you with dosage schedules.  As your condition improves, your dosage needs may increase. It will be necessary to have continuing blood tests as suggested by your caregiver.  Report all suspected medication side effects to your caregiver. SEEK MEDICAL CARE IF: Seek medical care if you develop:  Sweating.  Tremulousness (tremors).  Anxiety.  Rapid weight loss.  Heat intolerance.  Emotional swings.  Diarrhea.  Weakness. SEEK IMMEDIATE MEDICAL CARE IF:  You develop chest pain, an irregular heart beat (palpitations), or a rapid heart beat. MAKE SURE YOU:   Understand these instructions.  Will watch your condition.  Will get help right away if you are not doing well or get worse. Document Released: 03/08/2005 Document Revised: 05/31/2011 Document Reviewed: 10/27/2007 [facility] Patient Information 2015 [place], [place]. This information is not intended to replace advice given to you by your health care provider. Make sure you discuss any questions you have with your health care provider.

## 2013-10-16 NOTE — Progress Notes (Signed)
Subjective:    Patient ID: Andrea Bates, female    DOB: 08-07-1945, 68 y.o.   MRN: [id number]  HPI Patient who is highly anxious and has had negative w/u 's by Dr Aundra Dubin (Cardiology)  & Dr Henrene Pastor (GI) and presents today for f/u of thyroid dose adjustment to Tirosent  having taken a sporadic schedule of 13 mcg , 25 mcg & 50 mcg in an entropic fashion as she feels the low dose of thyroid raised her BP, caused palpitations and made her feel anxious and the higher dose of thyroid dropped her BP, slowed her heart rate and made her lethargic!  In the past she has been very reluctant to try any medications for anxiety.   Medication List   ALPRAZolam 0.25 MG tablet  Commonly known as:  XANAX  Take 0.25 mg by mouth as needed for anxiety (takes 1/3 of tablet).     CALCIUM + D PO  Take 1 tablet by mouth daily.     cholecalciferol 1000 UNITS tablet  Commonly known as:  VITAMIN D  Take 4,000 Units by mouth daily.     CoQ10 100 MG Caps  Take 100 mg by mouth 3 (three) times daily.     fish oil-omega-3 fatty acids 1000 MG capsule  Take 2 g by mouth.     Flax Seed Oil 1000 MG Caps  Take by mouth daily.     hyoscyamine 0.125 MG SL tablet  Commonly known as:  LEVSIN SL  Take 1-2 tablets sublingually every 4 hours as needed for abdominal pain.     MAGNESIUM PO  Take by mouth daily.     mometasone 50 MCG/ACT nasal spray  Commonly known as:  NASONEX  Place 2 sprays into the nose daily.     OVER THE COUNTER MEDICATION  Sunflower leicthin 2 tablespoons of powder daily for cholesterol     propranolol 40 MG tablet  Commonly known as:  INDERAL  Take 1 tablet (40 mg total) by mouth 2 (two) times daily.     SYNTHROID 75 MCG tablet  (currently on Tirosent as above)  Generic drug:  levothyroxine  TAKE 1 TABLET BY MOUTH EVERY DAY       Allergies  Allergen Reactions  . Armour Thyroid [Thyroid]     Palpitations   . Atenolol     presyncope  . Celexa [Citalopram Hydrobromide]     Severe  chest tightness   . Diltiazem Hcl     Dropped bp  . Lipitor [Atorvastatin]     Myalgias   . Propranolol Hcl     Felt bad  . Vytorin [Ezetimibe-Simvastatin]     Myalgias    Past Medical History  Diagnosis Date  . Diverticulosis   . Hiatal hernia   . Hypothyroidism   . Personal history of colonic polyps     a. colonoscopy 2009  . Personal history of malignant neoplasm of breast   . Hemorrhoids   . Hyperlipidemia   . Palpitations     a. 04/2008 echo: EF 50-60%;  b. 21 day event monitor 2012 - occasional sinus tachycardia with rates 110-120's. Rare PAC's/PVC's. Serveral short 4-5 beat runs of a-tach.  . Irritable bowel syndrome   . Hyperlipidemia     Is now taking Fish oil. Had myalgias with Vytorin and Lipitor  . GERD (gastroesophageal reflux disease)     a. mild esophagitis on egd 2012  . Pre-diabetes   . Chest pain     a.  negative myoview 2011 [name]);  b. 04/2010 Cath: normal cors   Review of Systems In addition to the HPI above,  No Fever-chills,  No Headache, No changes with Vision or hearing,  No problems swallowing food or Liquids,  No Chest pain or productive Cough or Shortness of Breath,  No Abdominal pain, No Nausea or Vomitting, Bowel movements are regular,  No Blood in stool or Urine,  No dysuria,  No new skin rashes or bruises,  No new joints pains-aches,  No new weakness, tingling, numbness in any extremity,  No recent weight loss,  No polyuria, polydypsia or polyphagia,  No significant Mental Stressors.  A full 10 point Review of Systems was done, except as stated above, all other Review of Systems were negative  Objective:   Physical Exam  BP 120/66  Pulse 80  Temp(Src) 98.2 F (36.8 C) (Temporal)  Resp 16  Ht 5\' 2"  (1.575 m)  Wt 107 lb 12.8 oz (48.898 kg)  BMI 19.71 kg/m2  HEENT - Eac's patent. TM's Nl. EOM's full. PERRLA. NasoOroPharynx clear. Neck - supple. Nl Thyroid. Carotids 2+ & No bruits, nodes, JVD Chest - Clear equal BS w/o Rales,  rhonchi, wheezes. Cor - Nl HS. RRR w/o sig MGR. PP 1(+). No edema. MS- FROM w/o deformities. Muscle power, tone and bulk Nl. Gait Nl. Neuro - No obvious Cr N abnormalities. Sensory, motor and Cerebellar functions appear Nl w/o focal abnormalities. Psyche - Mental status - anxious.  No hallucinations at present.  Assessment & Plan:   1. Unspecified hypothyroidism  - TSH - Today patient seems amenable to restarting her 75 mcg dose of l-thyroxine at 1/2 tab = 37.5 mcg to find a "happy median" between the Sx's she experience at the 25 and 50 mcg doses. ROV 1 mo recheck TSH.

## 2013-10-17 LAB — TSH: TSH: 1.548 u[IU]/mL (ref 0.350–4.500)

## 2013-10-31 ENCOUNTER — Encounter: Payer: Self-pay | Admitting: Physician Assistant

## 2013-11-23 ENCOUNTER — Ambulatory Visit (INDEPENDENT_AMBULATORY_CARE_PROVIDER_SITE_OTHER): Payer: Medicare HMO | Admitting: *Deleted

## 2013-11-23 DIAGNOSIS — E039 Hypothyroidism, unspecified: Secondary | ICD-10-CM

## 2013-11-23 LAB — TSH: TSH: 1.396 u[IU]/mL (ref 0.350–4.500)

## 2013-11-23 NOTE — Progress Notes (Signed)
Patient ID: Andrea Bates, female   DOB: 25-Dec-1945, 68 y.o.   MRN: [id number] Patient presents for recheck TSH today.  Patient states she takes 1/2 of 75 mcg pill (= 37.5 mcg) Levothyroxine Rx daily.

## 2014-02-28 ENCOUNTER — Ambulatory Visit: Payer: Self-pay | Admitting: Internal Medicine

## 2014-02-28 ENCOUNTER — Ambulatory Visit (INDEPENDENT_AMBULATORY_CARE_PROVIDER_SITE_OTHER): Payer: Medicare HMO | Admitting: Physician Assistant

## 2014-02-28 ENCOUNTER — Encounter: Payer: Self-pay | Admitting: Physician Assistant

## 2014-02-28 VITALS — BP 150/86 | HR 80 | Temp 98.1°F | Resp 16 | Ht 62.0 in | Wt 109.2 lb

## 2014-02-28 DIAGNOSIS — Z79899 Other long term (current) drug therapy: Secondary | ICD-10-CM

## 2014-02-28 DIAGNOSIS — N3000 Acute cystitis without hematuria: Secondary | ICD-10-CM

## 2014-02-28 DIAGNOSIS — R5383 Other fatigue: Secondary | ICD-10-CM

## 2014-02-28 DIAGNOSIS — E039 Hypothyroidism, unspecified: Secondary | ICD-10-CM

## 2014-02-28 DIAGNOSIS — R232 Flushing: Secondary | ICD-10-CM

## 2014-02-28 DIAGNOSIS — D649 Anemia, unspecified: Secondary | ICD-10-CM

## 2014-02-28 LAB — CBC WITH DIFFERENTIAL/PLATELET
BASOS ABS: 0.1 10*3/uL (ref 0.0–0.1)
Basophils Relative: 1 % (ref 0–1)
EOS ABS: 0 10*3/uL (ref 0.0–0.7)
Eosinophils Relative: 0 % (ref 0–5)
HEMATOCRIT: 38.3 % (ref 36.0–46.0)
Hemoglobin: 13.2 g/dL (ref 12.0–15.0)
Lymphocytes Relative: 30 % (ref 12–46)
Lymphs Abs: 1.9 10*3/uL (ref 0.7–4.0)
MCH: 29.3 pg (ref 26.0–34.0)
MCHC: 34.5 g/dL (ref 30.0–36.0)
MCV: 85.1 fL (ref 78.0–100.0)
MPV: 9.1 fL — AB (ref 9.4–12.4)
Monocytes Absolute: 0.6 10*3/uL (ref 0.1–1.0)
Monocytes Relative: 9 % (ref 3–12)
NEUTROS ABS: 3.7 10*3/uL (ref 1.7–7.7)
NEUTROS PCT: 60 % (ref 43–77)
Platelets: 351 10*3/uL (ref 150–400)
RBC: 4.5 MIL/uL (ref 3.87–5.11)
RDW: 13.7 % (ref 11.5–15.5)
WBC: 6.2 10*3/uL (ref 4.0–10.5)

## 2014-02-28 LAB — TSH: TSH: 1.596 u[IU]/mL (ref 0.350–4.500)

## 2014-02-28 LAB — HEPATIC FUNCTION PANEL
ALT: 25 U/L (ref 0–35)
AST: 24 U/L (ref 0–37)
Albumin: 4.7 g/dL (ref 3.5–5.2)
Alkaline Phosphatase: 104 U/L (ref 39–117)
BILIRUBIN INDIRECT: 0.4 mg/dL (ref 0.2–1.2)
BILIRUBIN TOTAL: 0.5 mg/dL (ref 0.2–1.2)
Bilirubin, Direct: 0.1 mg/dL (ref 0.0–0.3)
Total Protein: 7.3 g/dL (ref 6.0–8.3)

## 2014-02-28 LAB — BASIC METABOLIC PANEL WITH GFR
BUN: 13 mg/dL (ref 6–23)
CO2: 27 mEq/L (ref 19–32)
Calcium: 10 mg/dL (ref 8.4–10.5)
Chloride: 102 mEq/L (ref 96–112)
Creat: 0.71 mg/dL (ref 0.50–1.10)
GFR, EST NON AFRICAN AMERICAN: 88 mL/min
GLUCOSE: 90 mg/dL (ref 70–99)
POTASSIUM: 4.3 meq/L (ref 3.5–5.3)
SODIUM: 141 meq/L (ref 135–145)

## 2014-02-28 LAB — IRON AND TIBC
%SAT: 28 % (ref 20–55)
Iron: 98 ug/dL (ref 42–145)
TIBC: 355 ug/dL (ref 250–470)
UIBC: 257 ug/dL (ref 125–400)

## 2014-02-28 LAB — MAGNESIUM: MAGNESIUM: 2.2 mg/dL (ref 1.5–2.5)

## 2014-02-28 LAB — FERRITIN: Ferritin: 68 ng/mL (ref 10–291)

## 2014-02-28 NOTE — Patient Instructions (Addendum)
Recommend that you see [place] [facility] stress better. Address: [address], [place] [address] Phone-[phone]  24-Hour Urine Collection HOME CARE  When you get up in the morning on the day you do this test, pee (urinate) in the toilet and flush. Make a note of the time. This will be your start time on the day of collection and the end time on the next morning.  From then on, save all your pee (urine) in the plastic jug that was given to you.  You should stop collecting your pee 24 hours after you started.  If the plastic jug that is given to you already has liquid in it, that is okay. Do not throw out the liquid or rinse out the jug. Some tests need the liquid to be added to your pee.  Keep your plastic jug cool (in an ice chest or the refrigerator) during the test.  When the 24 hours is over, bring your plastic jug to the clinic lab. Keep the jug cool (in an ice chest) while you are bringing it to the lab. Document Released: 06/04/2008 Document Revised: 05/31/2011 Document Reviewed: 06/04/2008 [facility] Patient Information 2015 [place], [place]. This information is not intended to replace advice given to you by your health care provider. Make sure you discuss any questions you have with your health care provider.  Hypertension Hypertension, commonly called high blood pressure, is when the force of blood pumping through your arteries is too strong. Your arteries are the blood vessels that carry blood from your heart throughout your body. A blood pressure reading consists of a higher number over a lower number, such as 110/72. The higher number (systolic) is the pressure inside your arteries when your heart pumps. The lower number (diastolic) is the pressure inside your arteries when your heart relaxes. Ideally you want your blood pressure below 120/80. Hypertension forces your heart to work harder to pump blood. Your arteries may become narrow or stiff.  Having hypertension puts you at risk for heart disease, stroke, and other problems.  RISK FACTORS Some risk factors for high blood pressure are controllable. Others are not.  Risk factors you cannot control include:   Race. You may be at higher risk if you are African American.  Age. Risk increases with age.  Gender. Men are at higher risk than women before age 68 years. After age 42, women are at higher risk than men. Risk factors you can control include:  Not getting enough exercise or physical activity.  Being overweight.  Getting too much fat, sugar, calories, or salt in your diet.  Drinking too much alcohol. SIGNS AND SYMPTOMS Hypertension does not usually cause signs or symptoms. Extremely high blood pressure (hypertensive crisis) may cause headache, anxiety, shortness of breath, and nosebleed. DIAGNOSIS  To check if you have hypertension, your health care provider will measure your blood pressure while you are seated, with your arm held at the level of your heart. It should be measured at least twice using the same arm. Certain conditions can cause a difference in blood pressure between your right and left arms. A blood pressure reading that is higher than normal on one occasion does not mean that you need treatment. If one blood pressure reading is high, ask your health care provider about having it checked again. TREATMENT  Treating high blood pressure includes making lifestyle changes and possibly taking medicine. Living a healthy lifestyle can help lower high blood pressure. You  may need to change some of your habits. Lifestyle changes may include:  Following the DASH diet. This diet is high in fruits, vegetables, and whole grains. It is low in salt, red meat, and added sugars.  Getting at least 2 hours of brisk physical activity every week.  Losing weight if necessary.  Not smoking.  Limiting alcoholic beverages.  Learning ways to reduce stress. If lifestyle  changes are not enough to get your blood pressure under control, your health care provider may prescribe medicine. You may need to take more than one. Work closely with your health care provider to understand the risks and benefits. HOME CARE INSTRUCTIONS  Have your blood pressure rechecked as directed by your health care provider.   Take medicines only as directed by your health care provider. Follow the directions carefully. Blood pressure medicines must be taken as prescribed. The medicine does not work as well when you skip doses. Skipping doses also puts you at risk for problems.   Do not smoke.   Monitor your blood pressure at home as directed by your health care provider. SEEK MEDICAL CARE IF:   You think you are having a reaction to medicines taken.  You have recurrent headaches or feel dizzy.  You have swelling in your ankles.  You have trouble with your vision. SEEK IMMEDIATE MEDICAL CARE IF:  You develop a severe headache or confusion.  You have unusual weakness, numbness, or feel faint.  You have severe chest or abdominal pain.  You vomit repeatedly.  You have trouble breathing. MAKE SURE YOU:   Understand these instructions.  Will watch your condition.  Will get help right away if you are not doing well or get worse. Document Released: 03/08/2005 Document Revised: 07/23/2013 Document Reviewed: 12/29/2012 [facility] Patient Information 2015 [place], [place]. This information is not intended to replace advice given to you by your health care provider. Make sure you discuss any questions you have with your health care provider.

## 2014-02-28 NOTE — Progress Notes (Signed)
HPI 68 y.o.female with history of anxiety, hyerlipidemia, palpitations, preDM, GERD presents for elevated BP, flushing. She has had extensive work up with multiple doctors that have been negative, she has been encouraged to seek counseling but declines.    She states her Bp has been normally around 120's however she was sitting at home last Monday evening, felt very flushed without any other accompaniments other than elevated BP of 150's but since that episode she had exertional and nonextertional shortness of breath with fatigue with some palpitations. She has had decreased appetite but her sugars has been elevated. She has been on finesteride 5mg  1/2 a day for hair loss, she quit this last week. She has been on garlic for cholesterol. She is on 1/2 of the thyroid medication daily.    She has been taking tylenol sinus for sinus issues for the past week, denies NSAID use.    Past Medical History  Diagnosis Date  . Diverticulosis   . Hiatal hernia   . Hypothyroidism   . Personal history of colonic polyps     a. colonoscopy 2009  . Personal history of malignant neoplasm of breast   . Hemorrhoids   . Hyperlipidemia   . Palpitations     a. 04/2008 echo: EF 50-60%;  b. 21 day event monitor 2012 - occasional sinus tachycardia with rates 110-120's. Rare PAC's/PVC's. Serveral short 4-5 beat runs of a-tach.  . Irritable bowel syndrome   . Hyperlipidemia     Is now taking Fish oil. Had myalgias with Vytorin and Lipitor  . GERD (gastroesophageal reflux disease)     a. mild esophagitis on egd 2012  . Pre-diabetes   . Chest pain     a. negative myoview 2011 [name]);  b. 04/2010 Cath: normal cors     Allergies  Allergen Reactions  . [place]our Thyroid [Thyroid]     Palpitations   . Atenolol     presyncope  . Celexa [Citalopram Hydrobromide]     Severe chest tightness   . Diltiazem Hcl     Dropped bp  . Lipitor [Atorvastatin]     Myalgias   . Propranolol Hcl     Felt bad  . Vytorin  [Ezetimibe-Simvastatin]     Myalgias       Current Outpatient Prescriptions on File Prior to Visit  Medication Sig Dispense Refill  . ALPRAZolam (XANAX) 0.25 MG tablet Take 0.25 mg by mouth as needed for anxiety (takes 1/3 of tablet).    . Calcium Carbonate-Vitamin D (CALCIUM + D PO) Take 1 tablet by mouth daily.     . cholecalciferol (VITAMIN D) 1000 UNITS tablet Take 4,000 Units by mouth daily.     . Coenzyme Q10 (COQ10) 100 MG CAPS Take 100 mg by mouth 3 (three) times daily.    . fish oil-omega-3 fatty acids 1000 MG capsule Take 2 g by mouth.     . Flaxseed, Linseed, (FLAX SEED OIL) 1000 MG CAPS Take by mouth daily.      . hyoscyamine (LEVSIN SL) 0.125 MG SL tablet Take 1-2 tablets sublingually every 4 hours as needed for abdominal pain. 30 tablet 0  . MAGNESIUM PO Take by mouth daily.    . mometasone (NASONEX) 50 MCG/ACT nasal spray Place 2 sprays into the nose daily.    Marland Kitchen OVER THE COUNTER MEDICATION Sunflower leicthin 2 tablespoons of powder daily for cholesterol    . propranolol (INDERAL) 40 MG tablet Take 1 tablet (40 mg total) by mouth 2 (two) times  daily. 60 tablet 6  . SYNTHROID 75 MCG tablet TAKE 1 TABLET BY MOUTH EVERY DAY 30 tablet 6   No current facility-administered medications on file prior to visit.    ROS:  Review of Systems  Constitutional: Positive for malaise/fatigue. Negative for fever, chills, weight loss and diaphoresis.  HENT: Positive for congestion. Negative for ear discharge, ear pain, hearing loss, nosebleeds, sore throat and tinnitus.   Eyes: Negative.   Respiratory: Positive for shortness of breath. Negative for cough, hemoptysis, sputum production, wheezing and stridor.   Cardiovascular: Positive for palpitations. Negative for chest pain, orthopnea, claudication, leg swelling and PND.  Gastrointestinal: Negative.   Genitourinary: Negative.   Musculoskeletal: Negative.   Skin: Negative.  Negative for rash.  Neurological: Positive for dizziness and  headaches. Negative for tingling, tremors, sensory change, speech change, focal weakness, seizures, loss of consciousness and weakness.     Physical Exam: Filed Weights   02/28/14 1109  Weight: 109 lb 3.2 oz (49.533 kg)   BP 150/86 mmHg  Pulse 80  Temp(Src) 98.1 F (36.7 C)  Resp 16  Ht 5\' 2"  (1.575 m)  Wt 109 lb 3.2 oz (49.533 kg)  BMI 19.97 kg/m2 General Appearance: Well nourished, in no apparent distress, face is flushed. Eyes: PERRLA, EOMs, conjunctiva no swelling or erythema Sinuses: No Frontal/maxillary tenderness ENT/Mouth: Ext aud canals clear, TMs without erythema, bulging. No erythema, swelling, or exudate on post pharynx.  Tonsils not swollen or erythematous. Hearing normal.  Neck: Supple, thyroid normal.  Respiratory: Respiratory effort normal, BS equal bilaterally without rales, rhonchi, wheezing or stridor.  Cardio: RRR with no MRGs. Brisk peripheral pulses without edema.  Abdomen: Soft, + BS.  Non tender, no guarding, rebound, hernias, masses. Lymphatics: Non tender without lymphadenopathy.  Musculoskeletal: Full ROM, 5/5 strength, normal gait.  Skin: Warm, dry without rashes, lesions, ecchymosis.  Neuro: Cranial nerves intact. Normal muscle tone, no cerebellar symptoms. Sensation intact.  Psych: Awake and oriented X 3, normal affect, Insight and Judgment appropriate.     Assessment and Plan: Elevated BP- stop decongestants- check TSH, infection, since with the flushing, get 24 hour urine and rule out Pheochromocytoma ? Anxiety driven- LONG discussion about anxiety and somatic disorder/comlaints, declines any medication at this time but if she does not follow medical advise to get evaluated may have to dismiss from the practice.      Vicie Mutters, PA-C 11:20 AM [facility]

## 2014-03-01 LAB — URINALYSIS, ROUTINE W REFLEX MICROSCOPIC
Bilirubin Urine: NEGATIVE
Glucose, UA: NEGATIVE mg/dL
HGB URINE DIPSTICK: NEGATIVE
Leukocytes, UA: NEGATIVE
NITRITE: NEGATIVE
PH: 6 (ref 5.0–8.0)
Protein, ur: NEGATIVE mg/dL
Specific Gravity, Urine: 1.008 (ref 1.005–1.030)
Urobilinogen, UA: 0.2 mg/dL (ref 0.0–1.0)

## 2014-03-01 LAB — FOLATE RBC: RBC FOLATE: 696 ng/mL (ref >280)

## 2014-03-07 NOTE — Addendum Note (Signed)
Addended by: Eulis Canner on: 03/07/2014 10:38 AM   Modules accepted: Orders

## 2014-03-12 LAB — CATECHOLAMINES, FRACTIONATED, URINE, 24 HOUR
CALCULATED TOTAL (E+ NE): 31 ug/(24.h) (ref 26–121)
Creatinine, Urine mg/day-CATEUR: 0.83 g/(24.h) (ref 0.63–2.50)
DOPAMINE, 24 HR URINE: 114 ug/(24.h) (ref 52–480)
Epinephrine, 24 hr Urine: 5 mcg/24 h (ref 2–24)
NOREPINEPHRINE, 24 HR UR: 26 ug/(24.h) (ref 15–100)
TOTAL VOLUME - CF 24HR U: 2400 mL

## 2014-04-03 ENCOUNTER — Ambulatory Visit (INDEPENDENT_AMBULATORY_CARE_PROVIDER_SITE_OTHER): Payer: Medicare Other | Admitting: Cardiology

## 2014-04-03 ENCOUNTER — Encounter: Payer: Self-pay | Admitting: Cardiology

## 2014-04-03 VITALS — BP 118/72 | HR 81 | Ht 62.0 in | Wt 107.1 lb

## 2014-04-03 DIAGNOSIS — R002 Palpitations: Secondary | ICD-10-CM

## 2014-04-03 DIAGNOSIS — E785 Hyperlipidemia, unspecified: Secondary | ICD-10-CM | POA: Diagnosis not present

## 2014-04-03 MED ORDER — EZETIMIBE 10 MG PO TABS: 10.0000 mg | ORAL_TABLET | Freq: Every day | ORAL | Status: DC

## 2014-04-03 MED ORDER — PROPRANOLOL HCL 40 MG PO TABS: 40.0000 mg | ORAL_TABLET | Freq: Two times a day (BID) | ORAL | Status: DC

## 2014-04-03 NOTE — Patient Instructions (Addendum)
Start Zetia 10mg  daily  I have sent a prescription for propranolol 40mg  two times a day to your pharmacy.   Your physician recommends that you return for a FASTING lipid profile /liver profile in 2 months.   Your physician wants you to follow-up in: 1 year with Dr Aundra Dubin. (January 2017). You will receive a reminder letter in the mail two months in advance. If you don't receive a letter, please call our office to schedule the follow-up appointment.

## 2014-04-04 NOTE — Progress Notes (Signed)
Patient ID: Andrea Bates, female   DOB: January 26, 1946, 69 y.o.   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 She underwent cardiac catheterization in 2/12 which showed normal coronaries and normal LV systolic function.  She has had a lot of problems with symptomatic PACs and PVCs in the past.  She has been intolerant of both beta blockers and calcium channel blockers.  She had a stress echo in 4/15 with 12' exercise, no evidence for ischemia or infarction.   After last appointment, she was going to start propranolol for palpitations and Zetia for hyperlipidemia.  She did not start either.  She continues to be anxious. She continues to have palpitations, similar to the past.  They are bothersome to her.  Monday, she had a bad episode of palpitations lasting up to 30 minutes.  Back in 12/15, she had an episode of flushing and dyspnea with abrupt rise in her blood pressure.  This has not recurred.   Labs (2/12): TSH, free T4, free T3 all normal. K 3.9, creatinine 0.62.  Labs (3/12): K 4.8, creatinine 0.6, Mg 2.5, urine catecholeamines normal  Labs (3/15): LDL 180 Labs (6/15): K 4.2, creatinine 0.66, TSH normal Labs (12/15): Urinary catecholeamines normal, HCT 38.3, TSH normal, creatinine 0.7  Allergies (verified):  1) * Shellfish   Past Medical History:  1. Hyperlipidemia: She had myalgias with Vytorin and Lipitor.  She had vertigo with Crestor.  2. Hypothyroidism.  3. Diverticulosis.  4. Anxiety.  5. IBS  6. Colonic polyps. ADENOMATOUS-LAST COLON 11/2007  7. GERD, esophagitis on EGD 8. Chest pain: [facility]iew at [facility] in [facility] was normal. LHC (2/12) showed normal coronaries, EF 65%.  Stress echo (4/15) with 12' exercise, no evidence for ischemia or infarction.  9. Echo (2/10): EF 78-29%, grade I diastolic dysfunction  10. Palpitations: 3 week event  monitor (2/12) with occasional sinus tachycardia with rates 110s-120s. Rare PACs/PVCs. Several short 4-5 beat runs of atrial tachycardia.  3-week event monitor in 6/13 showed occasioanl PVCs and PACs.  11. Sleep study normal 2/12  12. Allergic rhinitis 13. Diverticulosis  Family History:  Father with h/o 2 heart valve replacements. He also had a stroke. Brother with MI at age 28.  Family History of Colon Cancer:Great Uncle on Paternal Side  grandother had a goiter.   Social History:  Lives with husband in [place]. Works in a Special educational needs teacher. Never smoked. No ETOH.   ROS:  All systems reviewed and negative except as per HPI.    Current Outpatient Prescriptions  Medication Sig Dispense Refill  . ALPRAZolam (XANAX) 0.25 MG tablet Take 0.25 mg by mouth as needed for anxiety (takes 1/3 of tablet).    . Calcium Carbonate-Vitamin D (CALCIUM + D PO) Take 1 tablet by mouth daily.     . cholecalciferol (VITAMIN D) 1000 UNITS tablet Take 4,000 Units by mouth daily.     . Coenzyme Q10 (COQ10) 100 MG CAPS Take 100 mg by mouth 3 (three) times daily.    . fish oil-omega-3 fatty acids 1000 MG capsule Take 2 g by mouth daily.     . Flaxseed, Linseed, (FLAX SEED OIL) 1000 MG CAPS Take 1 capsule by mouth daily.     Marland Kitchen MAGNESIUM PO Take by mouth daily.    . mometasone (NASONEX) 50 MCG/ACT nasal spray Place 2 sprays into the nose daily.    Marland Kitchen  SYNTHROID 75 MCG tablet TAKE 1 TABLET BY MOUTH EVERY DAY 30 tablet 6  . ezetimibe (ZETIA) 10 MG tablet Take 1 tablet (10 mg total) by mouth daily. 90 tablet 0  . propranolol (INDERAL) 40 MG tablet Take 1 tablet (40 mg total) by mouth 2 (two) times daily. 180 tablet 3   No current facility-administered medications for this visit.    BP 118/72 mmHg  Pulse 81  Ht 5\' 2"  (1.575 m)  Wt 107 lb 1.9 oz (48.589 kg)  BMI 19.59 kg/m2 General: NAD Neck: No JVD, no thyromegaly or thyroid nodule.  Lungs: Clear to auscultation bilaterally with normal respiratory effort. CV:  Nondisplaced PMI.  Heart regular S1/S2, no S3/S4, no murmur.  No peripheral edema.  No carotid bruit.  Normal pedal pulses.  Abdomen: Soft, nontender, no hepatosplenomegaly, no distention.  Neurologic: Alert and oriented x 3.  Psych: Anxious Extremities: No clubbing or cyanosis.   Assessment/Plan: 1. Palpitations: She has known PACs and short runs of atrial tachycardia.  She has had trouble tolerating beta blockers or CCBs. As her symptoms are quite bothersome, I asked her again today to try propranolol at a low dose, 40 mg bid. She says she will try it.   2. CAD risk: Family history of premature CAD.  Normal cardiac cath in 2/12 and normal stress echo in 4/15.  She cannot tolerate statins.  LDL is very high.  I will ask her again to start Zetia with lipid check in 2 months. She says that she will try it.  3. Anxiety: I think that this plays a major role in her symptoms generally.   Loralie Champagne 04/04/2014

## 2014-04-16 DIAGNOSIS — J0100 Acute maxillary sinusitis, unspecified: Secondary | ICD-10-CM | POA: Diagnosis not present

## 2014-04-17 ENCOUNTER — Ambulatory Visit: Payer: Self-pay | Admitting: Physician Assistant

## 2014-04-26 DIAGNOSIS — H2513 Age-related nuclear cataract, bilateral: Secondary | ICD-10-CM | POA: Diagnosis not present

## 2014-05-06 DIAGNOSIS — M898X7 Other specified disorders of bone, ankle and foot: Secondary | ICD-10-CM | POA: Diagnosis not present

## 2014-05-06 DIAGNOSIS — M47812 Spondylosis without myelopathy or radiculopathy, cervical region: Secondary | ICD-10-CM | POA: Diagnosis not present

## 2014-05-16 DIAGNOSIS — L84 Corns and callosities: Secondary | ICD-10-CM | POA: Diagnosis not present

## 2014-05-16 DIAGNOSIS — M25771 Osteophyte, right ankle: Secondary | ICD-10-CM | POA: Diagnosis not present

## 2014-05-16 DIAGNOSIS — G8918 Other acute postprocedural pain: Secondary | ICD-10-CM | POA: Diagnosis not present

## 2014-05-20 DIAGNOSIS — Z9889 Other specified postprocedural states: Secondary | ICD-10-CM | POA: Diagnosis not present

## 2014-05-23 ENCOUNTER — Ambulatory Visit: Payer: Medicare HMO | Admitting: Cardiology

## 2014-05-27 DIAGNOSIS — Z9889 Other specified postprocedural states: Secondary | ICD-10-CM | POA: Diagnosis not present

## 2014-06-04 ENCOUNTER — Other Ambulatory Visit: Payer: Medicare Other

## 2014-06-06 ENCOUNTER — Ambulatory Visit: Payer: Self-pay | Admitting: Internal Medicine

## 2014-06-10 ENCOUNTER — Ambulatory Visit (INDEPENDENT_AMBULATORY_CARE_PROVIDER_SITE_OTHER): Payer: Medicare Other | Admitting: Physician Assistant

## 2014-06-10 ENCOUNTER — Encounter: Payer: Self-pay | Admitting: Physician Assistant

## 2014-06-10 VITALS — BP 122/72 | HR 80 | Temp 97.8°F | Resp 16 | Ht 62.0 in | Wt 106.0 lb

## 2014-06-10 DIAGNOSIS — Z0001 Encounter for general adult medical examination with abnormal findings: Secondary | ICD-10-CM

## 2014-06-10 DIAGNOSIS — R232 Flushing: Secondary | ICD-10-CM

## 2014-06-10 DIAGNOSIS — Z860100 Personal history of colon polyps, unspecified: Secondary | ICD-10-CM

## 2014-06-10 DIAGNOSIS — F4323 Adjustment disorder with mixed anxiety and depressed mood: Secondary | ICD-10-CM

## 2014-06-10 DIAGNOSIS — R6889 Other general symptoms and signs: Secondary | ICD-10-CM

## 2014-06-10 DIAGNOSIS — K5900 Constipation, unspecified: Secondary | ICD-10-CM

## 2014-06-10 DIAGNOSIS — R002 Palpitations: Secondary | ICD-10-CM | POA: Diagnosis not present

## 2014-06-10 DIAGNOSIS — R7303 Prediabetes: Secondary | ICD-10-CM

## 2014-06-10 DIAGNOSIS — E785 Hyperlipidemia, unspecified: Secondary | ICD-10-CM | POA: Diagnosis not present

## 2014-06-10 DIAGNOSIS — R1310 Dysphagia, unspecified: Secondary | ICD-10-CM

## 2014-06-10 DIAGNOSIS — R5383 Other fatigue: Secondary | ICD-10-CM

## 2014-06-10 DIAGNOSIS — K5730 Diverticulosis of large intestine without perforation or abscess without bleeding: Secondary | ICD-10-CM

## 2014-06-10 DIAGNOSIS — E039 Hypothyroidism, unspecified: Secondary | ICD-10-CM

## 2014-06-10 DIAGNOSIS — Z79899 Other long term (current) drug therapy: Secondary | ICD-10-CM

## 2014-06-10 DIAGNOSIS — R55 Syncope and collapse: Secondary | ICD-10-CM

## 2014-06-10 DIAGNOSIS — R42 Dizziness and giddiness: Secondary | ICD-10-CM

## 2014-06-10 DIAGNOSIS — Z789 Other specified health status: Secondary | ICD-10-CM

## 2014-06-10 DIAGNOSIS — Z853 Personal history of malignant neoplasm of breast: Secondary | ICD-10-CM | POA: Diagnosis not present

## 2014-06-10 LAB — BASIC METABOLIC PANEL WITH GFR
BUN: 15 mg/dL (ref 6–23)
CHLORIDE: 104 meq/L (ref 96–112)
CO2: 28 mEq/L (ref 19–32)
CREATININE: 0.66 mg/dL (ref 0.50–1.10)
Calcium: 9.8 mg/dL (ref 8.4–10.5)
GFR, Est African American: >89 mL/min
Glucose, Bld: 89 mg/dL (ref 70–99)
Potassium: 4.7 mEq/L (ref 3.5–5.3)
Sodium: 141 mEq/L (ref 135–145)

## 2014-06-10 LAB — CBC WITH DIFFERENTIAL/PLATELET
Basophils Absolute: 0.1 10*3/uL (ref 0.0–0.1)
Basophils Relative: 1 % (ref 0–1)
EOS ABS: 0.1 10*3/uL (ref 0.0–0.7)
EOS PCT: 1 % (ref 0–5)
HEMATOCRIT: 39.9 % (ref 36.0–46.0)
HEMOGLOBIN: 13.6 g/dL (ref 12.0–15.0)
Lymphocytes Relative: 25 % (ref 12–46)
Lymphs Abs: 1.8 10*3/uL (ref 0.7–4.0)
MCH: 29.4 pg (ref 26.0–34.0)
MCHC: 34.1 g/dL (ref 30.0–36.0)
MCV: 86.2 fL (ref 78.0–100.0)
MONOS PCT: 9 % (ref 3–12)
MPV: 9.7 fL (ref 8.6–12.4)
Monocytes Absolute: 0.6 10*3/uL (ref 0.1–1.0)
Neutro Abs: 4.5 10*3/uL (ref 1.7–7.7)
Neutrophils Relative %: 64 % (ref 43–77)
Platelets: 378 10*3/uL (ref 150–400)
RBC: 4.63 MIL/uL (ref 3.87–5.11)
RDW: 13.6 % (ref 11.5–15.5)
WBC: 7 10*3/uL (ref 4.0–10.5)

## 2014-06-10 LAB — HEPATIC FUNCTION PANEL
ALK PHOS: 92 U/L (ref 39–117)
ALT: 19 U/L (ref 0–35)
AST: 19 U/L (ref 0–37)
Albumin: 4.7 g/dL (ref 3.5–5.2)
Bilirubin, Direct: 0.1 mg/dL (ref 0.0–0.3)
Indirect Bilirubin: 0.2 mg/dL (ref 0.2–1.2)
TOTAL PROTEIN: 7 g/dL (ref 6.0–8.3)
Total Bilirubin: 0.3 mg/dL (ref 0.2–1.2)

## 2014-06-10 LAB — TSH: TSH: 1.495 u[IU]/mL (ref 0.350–4.500)

## 2014-06-10 MED ORDER — ALPRAZOLAM 0.5 MG PO TABS
0.5000 mg | ORAL_TABLET | Freq: Three times a day (TID) | ORAL | Status: DC | PRN
Start: 2014-06-10 — End: 2014-11-06

## 2014-06-10 NOTE — Patient Instructions (Addendum)
Encourage you to get the 3D Mammogram  The 3D Mammogram is much more specific and sensitive to pick up breast cancer. For women with fibrocystic breast or lumpy breast it can be hard to determine if it is cancer or not but the 3D mammogram is able to tell this difference which cuts back on unneeded additional tests or scary call backs.   [facility]  7 a.m.-6:30 p.m., Monday 7 a.m.-5 p.m., Tuesday-Friday Schedule an appointment by calling [phone].  [facility] Schedule an appointment by calling [phone].  Start on [facility] [place] wart, can take xanax PRN in the mean time.   Take nexium for 10days over the counter for 2 weeks, then go to zantac 150-300 mg at night for 2 weeks, then you can stop.  Avoid alcohol, spicy foods, NSAIDS (aleve, ibuprofen) at this time. See foods below.   Food Choices for Gastroesophageal Reflux Disease When you have gastroesophageal reflux disease (GERD), the foods you eat and your eating habits are very important. Choosing the right foods can help ease the discomfort of GERD. WHAT GENERAL GUIDELINES DO I NEED TO FOLLOW?  Choose fruits, vegetables, whole grains, low-fat dairy products, and low-fat meat, fish, and poultry.  Limit fats such as oils, salad dressings, butter, nuts, and avocado.  Keep a food diary to identify foods that cause symptoms.  Avoid foods that cause reflux. These may be different for different people.  Eat frequent small meals instead of three large meals each day.  Eat your meals slowly, in a relaxed setting.  Limit fried foods.  Cook foods using methods other than frying.  Avoid drinking alcohol.  Avoid drinking large amounts of liquids with your meals.  Avoid bending over or lying down until 2-3 hours after eating. WHAT FOODS ARE NOT RECOMMENDED? The following are some foods and drinks that may worsen your symptoms: Vegetables Tomatoes. Tomato juice. Tomato and spaghetti sauce.  Chili peppers. Onion and garlic. Horseradish. Fruits Oranges, grapefruit, and lemon (fruit and juice). Meats High-fat meats, fish, and poultry. This includes hot dogs, ribs, ham, sausage, salami, and bacon. Dairy Whole milk and chocolate milk. Sour cream. Cream. Butter. Ice cream. Cream cheese.  Beverages Coffee and tea, with or without caffeine. Carbonated beverages or energy drinks. Condiments Hot sauce. Barbecue sauce.  Sweets/Desserts Chocolate and cocoa. Donuts. Peppermint and spearmint. Fats and Oils High-fat foods, including [place] fries and potato chips. Other Vinegar. Strong spices, such as black pepper, white pepper, red pepper, cayenne, curry powder, cloves, ginger, and chili powder.

## 2014-06-10 NOTE — Progress Notes (Signed)
MEDICARE ANNUAL WELLNESS VISIT AND FOLLOW UP  Assessment:   1. Hyperlipidemia Intolerant to statins, needs to try zetia/discussed praulent.  - NMR Lipoprofile with Lipids  2. Palpitations Normal cath, normal stress echo, normal holter LONG discussion about how possibly anxiety related and that we will not do anymore work ups unless there are new findings/symptoms Declines CCB at this time - CBC with Differential/Platelet - BASIC METABOLIC PANEL WITH GFR - Hepatic function panel - TSH  3. PERSONAL HX BREAST CANCER OVER DUE FOR MGM, will get one  4. History of colonic polyps Up to date  5. Other fatigue Fatigue- observation for worsening or other new symptoms such as pain, fever or weight loss, considering a low dose SSRI antidepressant medication as treatment for nonspecific fatigue and LONG discussion that fatigue and anxiety can contribute to multitude of symptoms, reducing stress where possible and getting more rest and sleep  6. Prediabetes Better with diet.   7. Medication management Continue medication  8. VERTIGO Get on allergy pill, normal neuro  9. Hypothyroidism, unspecified hypothyroidism type Continue only 1/2 pill a day and will keep range closer to 4 due to anxiety/palpitations.  10. Constipation, unspecified constipation type Constipation- Increase fiber/ water intake, decrease caffeine, increase activity level, can add bulk (psyllium) until BM soft. Laboratory tests per orders. Please go to the hospital if you have severe abdominal pain, vomiting, fever, CP, SOB.   11. Dysphagia/GERD ? From niacin/supplements- try nexium samples and then get on zantac. Has had normal test including EGD, emptying study, etc.   12. Diverticulosis of large intestine without hemorrhage Normal colonoscopy, increase fiber  13. Syncope (VasoVagal Faint) Extensive normal cardio work up, BP is normal, HR normal today.  Monitor  14. Flushing Stop niacin, increase fluids.    15. Anxiety LONG discussion about anxiety/depresssion, patient is very tearful, did explain in full length/detail that all cardiac work ups have been negative and she is low risk She was able to finally admit today that she has anxiety and will consider medications. Will start on xanax PRN and St. John's Wart Will follow up 1 month Discussed finding a medication is trial and error and she has to be willing to try Suggesting seeing psych/counseling  She is still insistent on taking a "natural " approach though I have mentioned that she may want to consider stopping to see the holistic "doctor" due to some of the potentially false advise she has gotten in the past and i explained even supplements have side effects and can interact.   16. Health Maintenance Patient declines, understands the risks of not getting the vaccine  17. Osteopenia Get DEXA  OVER 40 minutes of exam, counseling, chart review, LONG discussion with the patient in regards to how her anxiety is affecting and how with extensive negative testing if she is not willing to try treatment for anxiety/depression we will have to dismiss her from the practice.    Plan:   During the course of the visit the patient was educated and counseled about appropriate screening and preventive services including:    Pneumococcal vaccine   Influenza vaccine  Td vaccine  Screening electrocardiogram  Screening mammography  Bone densitometry screening  Colorectal cancer screening  Diabetes screening  Glaucoma screening  Nutrition counseling   Advanced directives: given info/requested  Conditions/risks identified: She is not diabetic Urinary Incontinence is not an issue: discussed non pharmacology and pharmacology options.  Fall risk: low- discussed PT, home fall assessment, medications.    Subjective:  Andrea Bates is a 69 y.o. female who presents for Medicare Annual Wellness Visit and 3 month follow up on  hypertension, hyperlipidemia, vitamin D def.  Date of last medicare wellness visit is unknown.   Her blood pressure has been controlled at home, today their BP is BP: 122/72 mmHg She does workout. She denies chest pain, shortness of breath, dizziness.  She is not on cholesterol medication and denies myalgias. Her cholesterol is at goal. The cholesterol last visit was:   Lab Results  Component Value Date   CHOL 259* 06/06/2013   HDL 68 06/06/2013   LDLCALC 180* 06/06/2013   LDLDIRECT 180.1 02/01/2013   TRIG 57 06/06/2013   CHOLHDL 3.8 06/06/2013   She has been working on diet and exercise for prediabetes, and denies paresthesia of the feet, polydipsia, polyuria and visual disturbances. Last A1C in the office was:  Lab Results  Component Value Date   HGBA1C 5.5 06/06/2013   Patient is on Vitamin D supplement. She has a multitude of symptoms, worsening dizziness/light headed, palpitations, left sided chest discomfort. She was laid off 5 weeks ago, she has noticed her heart rate is higher the last week, going up to 105 during the day with activity. When she eats her heart will speed up.  She has had extensive work ups from multiple doctors, has had Normal cath 04/2010, normal stress echo, normal holter, normal CT AB/pelvis/chest, normal colonoscopy/EGD, normal labs. She continues to state she is afraid there is something wrong with her heart, watched her father die from MI/CHF. Her only risk factor is her cholesterol which she refuses statins and her family history.  She follows with a holistic "doctor" and put her on niacin x 3-4 weeks and grape seed oil for her LDL and "clogged arteries". I explained to the patient in detail that the regular niacin can worsen hot flashes/flushing and contribute to some of her symptoms and is used to increase the HDL which is very good on her.   She starts to cry as I mention the possibility that the majority of her symptoms are from stress/depression, she does  admit to worsening stress since getting laid off from her job 4-5 weeks ago.  She is on thyroid medication. Her medication was changed last visit, she is now on 26mcg 1/2 a pill a day, and did not take it for 3-4 days.   Lab Results  Component Value Date   TSH 1.596 02/28/2014  .    Medication Review Current Outpatient Prescriptions on File Prior to Visit  Medication Sig Dispense Refill  . Calcium Carbonate-Vitamin D (CALCIUM + D PO) Take 1 tablet by mouth daily.     . cholecalciferol (VITAMIN D) 1000 UNITS tablet Take 4,000 Units by mouth daily.     . Coenzyme Q10 (COQ10) 100 MG CAPS Take 100 mg by mouth 3 (three) times daily.    . fish oil-omega-3 fatty acids 1000 MG capsule Take 2 g by mouth daily.     . Flaxseed, Linseed, (FLAX SEED OIL) 1000 MG CAPS Take 1 capsule by mouth daily.     Marland Kitchen MAGNESIUM PO Take by mouth daily.    . mometasone (NASONEX) 50 MCG/ACT nasal spray Place 2 sprays into the nose daily.    Marland Kitchen SYNTHROID 75 MCG tablet TAKE 1 TABLET BY MOUTH EVERY DAY 30 tablet 6   No current facility-administered medications on file prior to visit.    Current Problems (verified) Patient Active Problem List   Diagnosis  Date Noted  . Flushing 02/28/2014  . Abdominal pain, unspecified site 06/13/2013  . Vitamin D Deficiency 06/06/2013  . PreDiabetes 06/06/2013  . Encounter for long-term (current) use of other medications 06/06/2013  . Fatigue 02/02/2013  . Other dysphagia 07/05/2010  . Constipation 07/05/2010  . VERTIGO 06/08/2010  . Palpitations 06/02/2010  . Syncope (VasoVagal Faint) 04/27/2010  . Hypothyroidism 03/31/2009  . DIVERTICULOSIS-COLON 03/31/2009  . PERSONAL HX BREAST CANCER 03/31/2009  . PERSONAL HX COLONIC POLYPS 03/31/2009  . Hyperlipidemia 05/07/2008    Screening Tests Immunization History  Administered Date(s) Administered  . Td 11/19/2002   Preventative care: Last colonoscopy: 2015 EGD 2015 Last mammogram: OVER DUE Last pap smear/pelvic exam: 2  years  DEXA: OVER DUE Echo 06/2013 normal Holter monitor x 2 AB [place] 2008 CT AB/PELVIS- 07/2013 CT chest 2012 Gastric emptying 2012  Prior vaccinations: Patient declines, understands the risks of not getting the vaccine  Names of Other Physician/Practitioners you currently use: 1. [facility]- here for primary care 2. [facility], eye doctor, last visit Feb 2016 3. Dr. Nicki Reaper, dentist, last visit 4 years ago Patient Care Team: Unk Pinto, MD as PCP - General (Internal Medicine) Irene Shipper, MD as Consulting Physician (Gastroenterology) Larey Dresser, MD as Consulting Physician (Cardiology)  Past Surgical History  Procedure Laterality Date  . Nasal sinus surgery    . Tonsillectomy    . Carpal tunnel release      bilateral   . Colonoscopy    . Bunionectomy     Family History  Problem Relation Age of Onset  . Colon cancer      Paternal Wiliam Ke   . Diabetes Paternal Grandmother   . Heart defect Father    History  Substance Use Topics  . Smoking status: Never Smoker   . Smokeless tobacco: Never Used  . Alcohol Use: No    MEDICARE WELLNESS OBJECTIVES: Tobacco use: She does not smoke.  Patient is not a former smoker. Alcohol Current alcohol use: none Caffeine Current caffeine use: denies use Osteoporosis: postmenopausal estrogen deficiency and dietary calcium and/or vitamin D deficiency, History of fracture in the past year: no Diet: in general, a "healthy" diet   Physical activity: walking and exercise class Depression/mood screen:  Not Screened, declines Hearing: normal Visual acuity: normal,  does perform annual eye exam  ADLs: self care Fall risk: Low Risk Home safety: excellent Cognitive Testing  Alert? Yes  Normal Appearance?Yes  Oriented to person? Yes  Place? Yes   Time? Yes  Recall of three objects?  Yes  Can perform simple calculations? Yes  Displays appropriate judgment?Yes  Can read the correct time  from a watch face?Yes EOL planning: No  and Information given   Objective:   Blood pressure 122/72, pulse 80, temperature 97.8 F (36.6 C), resp. rate 16, height 5\' 2"  (1.575 m), weight 106 lb (48.081 kg). Body mass index is 19.38 kg/(m^2).  General appearance: alert, no distress, WD/WN,  female HEENT: normocephalic, sclerae anicteric, TMs pearly, nares patent, no discharge or erythema, pharynx normal Oral cavity: MMM, no lesions Neck: supple, no lymphadenopathy, no thyromegaly, no masses Heart: RRR, normal S1, S2, no murmurs Lungs: CTA bilaterally, no wheezes, rhonchi, or rales Abdomen: +bs, soft, non tender, non distended, no masses, no hepatomegaly, no splenomegaly Musculoskeletal: nontender, no swelling, no obvious deformity Extremities: no edema, no cyanosis, no clubbing Pulses: 2+ symmetric, upper and lower extremities, normal cap refill Neurological: alert, oriented x 3, CN2-12  intact, strength normal upper extremities and lower extremities, sensation normal throughout, DTRs 2+ throughout, no cerebellar signs, gait normal Psychiatric: normal affect, tearful when talking about anxiety.  Breast: defer Gyn: defer Rectal: defer   Medicare Attestation I have personally reviewed: The patient's medical and social history Their use of alcohol, tobacco or illicit drugs Their current medications and supplements The patient's functional ability including ADLs,fall risks, home safety risks, cognitive, and hearing and visual impairment Diet and physical activities Evidence for depression or mood disorders  The patient's weight, height, BMI, and visual acuity have been recorded in the chart.  I have made referrals, counseling, and provided education to the patient based on review of the above and I have provided the patient with a written personalized care plan for preventive services.     Vicie Mutters, PA-C   06/10/2014

## 2014-06-12 LAB — NMR LIPOPROFILE WITH LIPIDS
Cholesterol, Total: 276 mg/dL — ABNORMAL HIGH (ref 100–199)
HDL Particle Number: 33.9 umol/L (ref >=30.5)
HDL Size: 9.9 nm (ref >=9.2)
HDL-C: 80 mg/dL (ref >39)
LARGE HDL: 12 umol/L (ref >=4.8)
LDL (calc): 184 mg/dL — ABNORMAL HIGH (ref 0–99)
LDL Particle Number: 1638 nmol/L — ABNORMAL HIGH (ref <1000)
LDL SIZE: 21.5 nm (ref >=20.8)
LP-IR Score: <25 (ref <=45)
Large VLDL-P: <0.8 nmol/L (ref <=2.7)
Small LDL Particle Number: 258 nmol/L (ref <=527)
Triglycerides: 61 mg/dL (ref 0–149)
VLDL Size: 31.8 nm (ref <=46.6)

## 2014-06-28 ENCOUNTER — Ambulatory Visit: Payer: Medicare Other | Admitting: Physician Assistant

## 2014-07-05 DIAGNOSIS — Z9889 Other specified postprocedural states: Secondary | ICD-10-CM | POA: Diagnosis not present

## 2014-07-11 ENCOUNTER — Ambulatory Visit (INDEPENDENT_AMBULATORY_CARE_PROVIDER_SITE_OTHER): Payer: Medicare Other | Admitting: *Deleted

## 2014-07-11 DIAGNOSIS — R5383 Other fatigue: Secondary | ICD-10-CM | POA: Diagnosis not present

## 2014-07-11 LAB — TSH: TSH: 1.713 u[IU]/mL (ref 0.350–4.500)

## 2014-07-11 NOTE — Progress Notes (Signed)
Patient ID: Andrea Bates, female   DOB: 12/26/1945, 69 y.o.   MRN: [id number] Patient was advised to d/c levothyroxine rx last month.  Patient complied with suggestions and has not taken any levothyroxine times 1 month.

## 2014-07-15 ENCOUNTER — Ambulatory Visit: Payer: Self-pay

## 2014-07-16 ENCOUNTER — Encounter: Payer: Self-pay | Admitting: *Deleted

## 2014-07-17 ENCOUNTER — Ambulatory Visit: Payer: Medicare Other | Admitting: Physician Assistant

## 2014-07-30 ENCOUNTER — Telehealth: Payer: Self-pay | Admitting: Cardiology

## 2014-07-30 DIAGNOSIS — R002 Palpitations: Secondary | ICD-10-CM

## 2014-07-30 NOTE — Telephone Encounter (Signed)
Pt advised to expect call from [facility] to schedule appts for echo and 24 hour monitor.

## 2014-07-30 NOTE — Telephone Encounter (Signed)
New message         Pt states her heart skips really bad and would like to speak to a nurse about it   Pt states she is not having any cp or sob

## 2014-07-30 NOTE — Telephone Encounter (Signed)
Reasonable to do echo and 24 hour holter to make sure no atrial fibrillation.

## 2014-07-30 NOTE — Telephone Encounter (Signed)
Pt states that her palpitations have changed --pt states palpitations more frequent in the last month, usually happens at night. When she lays down they seem to be worse.

## 2014-07-30 NOTE — Telephone Encounter (Signed)
Pt states she discussed this with Dr Aundra Dubin at time of office visit Jan 2016. Pt states she is hesitant to use propranolol even prn or lower dose because she is concerned it may lower her BP too much and cause more fatigue.  Pt concerned because recently palpitations have become more frequent (3-4 times a week) and she may have damaged her heart after the bad episode of palpitations in December Pt asking if she should have repeat echo to be sure her EF hasn't dropped and also a heart monitor. I advised pt I would forward to Dr Aundra Dubin for review.

## 2014-07-30 NOTE — Telephone Encounter (Signed)
Pt states she had an episode of palpitations in Dec 2015 that she is concerned did some damage to her heart.

## 2014-08-07 ENCOUNTER — Ambulatory Visit (HOSPITAL_COMMUNITY): Payer: Medicare Other | Attending: Cardiology

## 2014-08-07 ENCOUNTER — Encounter (INDEPENDENT_AMBULATORY_CARE_PROVIDER_SITE_OTHER): Payer: Medicare Other

## 2014-08-07 ENCOUNTER — Other Ambulatory Visit: Payer: Self-pay

## 2014-08-07 ENCOUNTER — Encounter: Payer: Self-pay | Admitting: *Deleted

## 2014-08-07 DIAGNOSIS — Z8249 Family history of ischemic heart disease and other diseases of the circulatory system: Secondary | ICD-10-CM | POA: Insufficient documentation

## 2014-08-07 DIAGNOSIS — R002 Palpitations: Secondary | ICD-10-CM | POA: Diagnosis not present

## 2014-08-07 DIAGNOSIS — E785 Hyperlipidemia, unspecified: Secondary | ICD-10-CM | POA: Diagnosis not present

## 2014-08-07 NOTE — Progress Notes (Signed)
Patient ID: Andrea Bates, female   DOB: 02-03-1946, 69 y.o.   MRN: [id number] [facility] 24 hour holter monitor applied to patient.

## 2014-08-08 ENCOUNTER — Encounter: Payer: Self-pay | Admitting: *Deleted

## 2014-08-12 ENCOUNTER — Ambulatory Visit (INDEPENDENT_AMBULATORY_CARE_PROVIDER_SITE_OTHER): Payer: Medicare Other | Admitting: Physician Assistant

## 2014-08-12 ENCOUNTER — Encounter: Payer: Self-pay | Admitting: Physician Assistant

## 2014-08-12 VITALS — BP 110/68 | HR 76 | Temp 98.2°F | Resp 16 | Wt 104.0 lb

## 2014-08-12 DIAGNOSIS — R7303 Prediabetes: Secondary | ICD-10-CM

## 2014-08-12 DIAGNOSIS — R5383 Other fatigue: Secondary | ICD-10-CM

## 2014-08-12 DIAGNOSIS — F411 Generalized anxiety disorder: Secondary | ICD-10-CM

## 2014-08-12 DIAGNOSIS — R002 Palpitations: Secondary | ICD-10-CM

## 2014-08-12 DIAGNOSIS — R7309 Other abnormal glucose: Secondary | ICD-10-CM | POA: Diagnosis not present

## 2014-08-12 DIAGNOSIS — E785 Hyperlipidemia, unspecified: Secondary | ICD-10-CM | POA: Diagnosis not present

## 2014-08-12 DIAGNOSIS — E559 Vitamin D deficiency, unspecified: Secondary | ICD-10-CM

## 2014-08-12 DIAGNOSIS — Z79899 Other long term (current) drug therapy: Secondary | ICD-10-CM

## 2014-08-12 DIAGNOSIS — E039 Hypothyroidism, unspecified: Secondary | ICD-10-CM | POA: Diagnosis not present

## 2014-08-12 LAB — CBC WITH DIFFERENTIAL/PLATELET
Basophils Absolute: 0 10*3/uL (ref 0.0–0.1)
Basophils Relative: 0 % (ref 0–1)
EOS PCT: 1 % (ref 0–5)
Eosinophils Absolute: 0.1 10*3/uL (ref 0.0–0.7)
HCT: 40.1 % (ref 36.0–46.0)
Hemoglobin: 13.8 g/dL (ref 12.0–15.0)
LYMPHS PCT: 25 % (ref 12–46)
Lymphs Abs: 1.7 10*3/uL (ref 0.7–4.0)
MCH: 29.8 pg (ref 26.0–34.0)
MCHC: 34.4 g/dL (ref 30.0–36.0)
MCV: 86.6 fL (ref 78.0–100.0)
MONO ABS: 0.5 10*3/uL (ref 0.1–1.0)
MONOS PCT: 8 % (ref 3–12)
MPV: 9.5 fL (ref 8.6–12.4)
NEUTROS ABS: 4.5 10*3/uL (ref 1.7–7.7)
NEUTROS PCT: 66 % (ref 43–77)
PLATELETS: 396 10*3/uL (ref 150–400)
RBC: 4.63 MIL/uL (ref 3.87–5.11)
RDW: 13.4 % (ref 11.5–15.5)
WBC: 6.8 10*3/uL (ref 4.0–10.5)

## 2014-08-12 LAB — LIPID PANEL
CHOLESTEROL: 262 mg/dL — AB (ref 0–200)
HDL: 70 mg/dL (ref >=46)
LDL Cholesterol: 181 mg/dL — ABNORMAL HIGH (ref 0–99)
TRIGLYCERIDES: 57 mg/dL (ref <150)
Total CHOL/HDL Ratio: 3.7 Ratio
VLDL: 11 mg/dL (ref 0–40)

## 2014-08-12 LAB — TSH: TSH: 1.617 u[IU]/mL (ref 0.350–4.500)

## 2014-08-12 LAB — BASIC METABOLIC PANEL WITH GFR
BUN: 16 mg/dL (ref 6–23)
CALCIUM: 9.7 mg/dL (ref 8.4–10.5)
CHLORIDE: 103 meq/L (ref 96–112)
CO2: 28 meq/L (ref 19–32)
Creat: 0.67 mg/dL (ref 0.50–1.10)
GFR, Est African American: >89 mL/min
Glucose, Bld: 92 mg/dL (ref 70–99)
POTASSIUM: 4.4 meq/L (ref 3.5–5.3)
SODIUM: 139 meq/L (ref 135–145)

## 2014-08-12 LAB — HEPATIC FUNCTION PANEL
ALT: 29 U/L (ref 0–35)
AST: 20 U/L (ref 0–37)
Albumin: 4.6 g/dL (ref 3.5–5.2)
Alkaline Phosphatase: 114 U/L (ref 39–117)
Bilirubin, Direct: 0.1 mg/dL (ref 0.0–0.3)
Indirect Bilirubin: 0.3 mg/dL (ref 0.2–1.2)
TOTAL PROTEIN: 7.1 g/dL (ref 6.0–8.3)
Total Bilirubin: 0.4 mg/dL (ref 0.2–1.2)

## 2014-08-12 LAB — MAGNESIUM: Magnesium: 2.2 mg/dL (ref 1.5–2.5)

## 2014-08-12 LAB — HEMOGLOBIN A1C
Hgb A1c MFr Bld: 5.8 % — ABNORMAL HIGH (ref <5.7)
Mean Plasma Glucose: 120 mg/dL — ABNORMAL HIGH (ref <117)

## 2014-08-12 MED ORDER — LEVOTHYROXINE SODIUM 50 MCG PO TABS: 50.0000 ug | ORAL_TABLET | Freq: Every day | ORAL | Status: DC

## 2014-08-12 NOTE — Progress Notes (Signed)
Assessment and Plan:  1. Hypertension -Continue medication, monitor blood pressure at home. Continue DASH diet.  Reminder to go to the ER if any CP, SOB, nausea, dizziness, severe HA, changes vision/speech, left arm numbness and tingling and jaw pain.  2. Cholesterol -Continue diet and exercise. Check cholesterol.   3. Hypothyroidism -check TSH level, continue medications the same, reminded to take on an empty stomach 30-57mins before food.  May switch to 73mcg and take 1/2 daily  4. Vitamin D Def - check level and continue medications.   5. Palpitations Normal echo, pending holter monitor, has had significant normal workup, possible sensitive carotid sinus,  But also thinks it could be anxiety, has not started medications.   6. Anxiety LONG discussion, if she does not try medication/get counseling will dismiss from the practice for noncompliance  Continue diet and meds as discussed. Further disposition pending results of labs. Over 30 minutes of exam, counseling, chart review, and critical decision making was performed  HPI 69 y.o. female  presents for 3 month follow up on hypertension, cholesterol, prediabetes, and vitamin D deficiency.   Her blood pressure has been controlled at home, today their BP is BP: 110/68 mmHg  She does workout. She denies chest pain, shortness of breath, dizziness.  She is not on cholesterol medication and denies myalgias. Her cholesterol is at goal. The cholesterol last visit was:   Lab Results  Component Value Date   CHOL 276* 06/10/2014   HDL 80 06/10/2014   LDLCALC 184* 06/10/2014   LDLDIRECT 180.1 02/01/2013   TRIG 61 06/10/2014   CHOLHDL 3.8 06/06/2013    Last A1C in the office was:  Lab Results  Component Value Date   HGBA1C 5.5 06/06/2013   Patient is on Vitamin D supplement.   Still having palpitations and has seen cardio, normal echo and is pending holter results She was suppose to start St. John's wart last visit in hopes of being  able to convince her that she may benefit from a different antianxiety medication but she never started.  She is tearful when anxiety is brought up.  She is on thyroid medication. Her medication was changed last visit, she stopped due to palpitations but states that when she did this her BP and HR has gone low so she started on 1/4 of 93mcg and thinks she may need a bit more.   Lab Results  Component Value Date   TSH 1.713 07/11/2014  .      Current Medications:  Current Outpatient Prescriptions on File Prior to Visit  Medication Sig Dispense Refill  . ALPRAZolam (XANAX) 0.5 MG tablet Take 1 tablet (0.5 mg total) by mouth 3 (three) times daily as needed for sleep or anxiety. 30 tablet 0  . Calcium Carbonate-Vitamin D (CALCIUM + D PO) Take 1 tablet by mouth daily.     . cholecalciferol (VITAMIN D) 1000 UNITS tablet Take 4,000 Units by mouth daily.     . Coenzyme Q10 (COQ10) 100 MG CAPS Take 100 mg by mouth 3 (three) times daily.    . fish oil-omega-3 fatty acids 1000 MG capsule Take 2 g by mouth daily.     . Flaxseed, Linseed, (FLAX SEED OIL) 1000 MG CAPS Take 1 capsule by mouth daily.     Marland Kitchen MAGNESIUM PO Take by mouth daily.    . mometasone (NASONEX) 50 MCG/ACT nasal spray Place 2 sprays into the nose daily.    Marland Kitchen SYNTHROID 75 MCG tablet TAKE 1 TABLET BY MOUTH  EVERY DAY 30 tablet 6   No current facility-administered medications on file prior to visit.   Medical History:  Past Medical History  Diagnosis Date  . Diverticulosis   . Hiatal hernia   . Hypothyroidism   . Personal history of colonic polyps     a. colonoscopy 2009  . Personal history of malignant neoplasm of breast   . Hemorrhoids   . Hyperlipidemia   . Palpitations     a. 04/2008 echo: EF 50-60%;  b. 21 day event monitor 2012 - occasional sinus tachycardia with rates 110-120's. Rare PAC's/PVC's. Serveral short 4-5 beat runs of a-tach.  . Irritable bowel syndrome   . Hyperlipidemia     Is now taking Fish oil. Had  myalgias with Vytorin and Lipitor  . GERD (gastroesophageal reflux disease)     a. mild esophagitis on egd 2012  . Pre-diabetes   . Chest pain     a. negative myoview 2011 [name]);  b. 04/2010 Cath: normal cors   Allergies:  Allergies  Allergen Reactions  . Armour Thyroid [Thyroid]     Palpitations   . Atenolol     presyncope  . Celexa [Citalopram Hydrobromide]     Severe chest tightness   . Diltiazem Hcl     Dropped bp  . Lipitor [Atorvastatin]     Myalgias   . Propranolol Hcl     Felt bad  . Vytorin [Ezetimibe-Simvastatin]     Myalgias     Review of Systems:  ROS  Family history- Review and unchanged Social history- Review and unchanged Physical Exam: BP 110/68 mmHg  Pulse 76  Temp(Src) 98.2 F (36.8 C)  Resp 16  Wt 104 lb (47.174 kg) Wt Readings from Last 3 Encounters:  08/12/14 104 lb (47.174 kg)  06/10/14 106 lb (48.081 kg)  04/03/14 107 lb 1.9 oz (48.589 kg)   General Appearance: Well nourished, in no apparent distress. Eyes: PERRLA, EOMs, conjunctiva no swelling or erythema Sinuses: No Frontal/maxillary tenderness ENT/Mouth: Ext aud canals clear, TMs without erythema, bulging. No erythema, swelling, or exudate on post pharynx.  Tonsils not swollen or erythematous. Hearing normal.  Neck: Supple, thyroid normal.  Respiratory: Respiratory effort normal, BS equal bilaterally without rales, rhonchi, wheezing or stridor.  Cardio: RRR with no MRGs. Brisk peripheral pulses without edema.  Abdomen: Soft, + BS,  Non tender, no guarding, rebound, hernias, masses. Lymphatics: Non tender without lymphadenopathy.  Musculoskeletal: Full ROM, 5/5 strength, Normal gait Skin: Warm, dry without rashes, lesions, ecchymosis.  Neuro: Cranial nerves intact. Normal muscle tone, no cerebellar symptoms. Psych: Awake and oriented X 3, normal affect, Insight and Judgment appropriate.    Vicie Mutters, PA-C 11:45 AM [facility]

## 2014-08-12 NOTE — Patient Instructions (Addendum)
Get on St John's wart   Palpitations A palpitation is the feeling that your heartbeat is irregular or is faster than normal. It may feel like your heart is fluttering or skipping a beat. Palpitations are usually not a serious problem. However, in some cases, you may need further medical evaluation. CAUSES  Palpitations can be caused by:  Smoking.  Caffeine or other stimulants, such as diet pills or energy drinks.  Alcohol.  Stress and anxiety.  Strenuous physical activity.  Fatigue.  Certain medicines.  DIAGNOSIS  To find the cause of your palpitations, your health care provider will take your medical history and perform a physical exam. Your health care provider may also have you take a test called an ambulatory electrocardiogram (ECG). An ECG records your heartbeat patterns over a 24-hour period. You may also have other tests, such as:  Transthoracic echocardiogram (TTE). During echocardiography, sound waves are used to evaluate how blood flows through your heart.  Transesophageal echocardiogram (TEE).  Cardiac monitoring. This allows your health care provider to monitor your heart rate and rhythm in real time.  Holter monitor. This is a portable device that records your heartbeat and can help diagnose heart arrhythmias. It allows your health care provider to track your heart activity for several days, if needed.  Stress tests by exercise or by giving medicine that makes the heart beat faster. TREATMENT  Treatment of palpitations depends on the cause of your symptoms and can vary greatly. Most cases of palpitations do not require any treatment other than time, relaxation, and monitoring your symptoms. Other causes, such as atrial fibrillation, atrial flutter, or supraventricular tachycardia, usually require further treatment. HOME CARE INSTRUCTIONS   Avoid:  Caffeinated coffee, tea, soft drinks, diet pills, and energy drinks.  Chocolate.  Alcohol.  Stop smoking if you  smoke.  Reduce your stress and anxiety. Things that can help you relax include:  A method of controlling things in your body, such as your heartbeats, with your mind (biofeedback).  Yoga.  Meditation.  Physical activity such as swimming, jogging, or walking.  Get plenty of rest and sleep. SEEK MEDICAL CARE IF:   You continue to have a fast or irregular heartbeat beyond 24 hours.  Your palpitations occur more often. SEEK IMMEDIATE MEDICAL CARE IF:  You have chest pain or shortness of breath.  You have a severe headache.  You feel dizzy or you faint. MAKE SURE YOU:  Understand these instructions.  Will watch your condition.  Will get help right away if you are not doing well or get worse. Document Released: 03/05/2000 Document Revised: 03/13/2013 Document Reviewed: 05/07/2011 [facility] Patient Information 2015 [place], [place]. This information is not intended to replace advice given to you by your health care provider. Make sure you discuss any questions you have with your health care provider.

## 2014-08-13 LAB — VITAMIN D 25 HYDROXY (VIT D DEFICIENCY, FRACTURES): Vit D, 25-Hydroxy: 44 ng/mL (ref 30–100)

## 2014-08-22 DIAGNOSIS — H04123 Dry eye syndrome of bilateral lacrimal glands: Secondary | ICD-10-CM | POA: Diagnosis not present

## 2014-09-16 ENCOUNTER — Other Ambulatory Visit: Payer: Self-pay

## 2014-09-18 DIAGNOSIS — M545 Low back pain: Secondary | ICD-10-CM | POA: Diagnosis not present

## 2014-09-18 DIAGNOSIS — M542 Cervicalgia: Secondary | ICD-10-CM | POA: Diagnosis not present

## 2014-09-18 DIAGNOSIS — M9901 Segmental and somatic dysfunction of cervical region: Secondary | ICD-10-CM | POA: Diagnosis not present

## 2014-09-18 DIAGNOSIS — M9903 Segmental and somatic dysfunction of lumbar region: Secondary | ICD-10-CM | POA: Diagnosis not present

## 2014-09-20 DIAGNOSIS — M9901 Segmental and somatic dysfunction of cervical region: Secondary | ICD-10-CM | POA: Diagnosis not present

## 2014-09-20 DIAGNOSIS — M542 Cervicalgia: Secondary | ICD-10-CM | POA: Diagnosis not present

## 2014-09-20 DIAGNOSIS — M545 Low back pain: Secondary | ICD-10-CM | POA: Diagnosis not present

## 2014-09-20 DIAGNOSIS — M9903 Segmental and somatic dysfunction of lumbar region: Secondary | ICD-10-CM | POA: Diagnosis not present

## 2014-09-25 DIAGNOSIS — M542 Cervicalgia: Secondary | ICD-10-CM | POA: Diagnosis not present

## 2014-09-25 DIAGNOSIS — M9901 Segmental and somatic dysfunction of cervical region: Secondary | ICD-10-CM | POA: Diagnosis not present

## 2014-09-25 DIAGNOSIS — M545 Low back pain: Secondary | ICD-10-CM | POA: Diagnosis not present

## 2014-09-25 DIAGNOSIS — M9903 Segmental and somatic dysfunction of lumbar region: Secondary | ICD-10-CM | POA: Diagnosis not present

## 2014-10-09 DIAGNOSIS — H938X3 Other specified disorders of ear, bilateral: Secondary | ICD-10-CM | POA: Diagnosis not present

## 2014-10-09 DIAGNOSIS — J301 Allergic rhinitis due to pollen: Secondary | ICD-10-CM | POA: Diagnosis not present

## 2014-10-09 DIAGNOSIS — M2662 Arthralgia of temporomandibular joint: Secondary | ICD-10-CM | POA: Diagnosis not present

## 2014-11-06 ENCOUNTER — Ambulatory Visit (INDEPENDENT_AMBULATORY_CARE_PROVIDER_SITE_OTHER): Payer: Medicare Other | Admitting: Internal Medicine

## 2014-11-06 ENCOUNTER — Encounter: Payer: Self-pay | Admitting: Internal Medicine

## 2014-11-06 VITALS — BP 122/78 | HR 76 | Temp 97.9°F | Resp 16 | Ht 62.0 in | Wt 105.4 lb

## 2014-11-06 DIAGNOSIS — R7309 Other abnormal glucose: Secondary | ICD-10-CM

## 2014-11-06 DIAGNOSIS — J320 Chronic maxillary sinusitis: Secondary | ICD-10-CM

## 2014-11-06 DIAGNOSIS — R7303 Prediabetes: Secondary | ICD-10-CM

## 2014-11-06 DIAGNOSIS — R059 Cough, unspecified: Secondary | ICD-10-CM

## 2014-11-06 DIAGNOSIS — Z681 Body mass index (BMI) 19 or less, adult: Secondary | ICD-10-CM | POA: Diagnosis not present

## 2014-11-06 DIAGNOSIS — R05 Cough: Secondary | ICD-10-CM | POA: Diagnosis not present

## 2014-11-06 MED ORDER — DEXAMETHASONE 0.5 MG PO TABS: ORAL_TABLET | ORAL | Status: AC

## 2014-11-06 MED ORDER — AZITHROMYCIN 250 MG PO TABS: ORAL_TABLET | ORAL | Status: DC

## 2014-11-06 MED ORDER — BENZONATATE 100 MG PO CAPS: ORAL_CAPSULE | ORAL | Status: AC

## 2014-11-06 NOTE — Progress Notes (Signed)
Subjective:    Patient ID: Andrea Bates, female    DOB: 04/13/1945, 69 y.o.   MRN: [id number]  HPI Very nice 69 yo MWF with c/o head congestion , sinus pressure and persistent dry hacking cough with scant sputum. No rash, fever, chills, sweats dyspnea or chest pains.  Medication Sig  . CALCIUM + D  Take 1 tablet by mouth daily.   Marland Kitchen VITAMIN D 1000 UNITS tablet Take 4,000 Units by mouth daily.   . Coenzyme Q10  100 MG CAPS Take 100 mg by mouth 3 (three) times daily.  . fish oil1000 MG capsule Take 2 g by mouth daily.   Marland Kitchen FLAX SEED OIL 1000 MG CAPS Take 1 capsule by mouth daily.   Marland Kitchen levothyroxine  50 MCG tablet Take 1 tablet (50 mcg total) by mouth daily before breakfast.  . MAGNESIUM PO Take by mouth daily.  Marland Kitchen NASONEX  nasal spray Place 2 sprays into the nose daily.  Marland Kitchen ALPRAZolam (XANAX) 0.5 MG tablet Take 1 tablet (0.5 mg total) by mouth 3 (three) times daily as needed for sleep or anxiety.   Allergies  Allergen Reactions  . Armour Thyroid [Thyroid]     Palpitations   . Atenolol     presyncope  . Celexa [Citalopram Hydrobromide]     Severe chest tightness   . Diltiazem Hcl     Dropped bp  . Lipitor [Atorvastatin]     Myalgias   . Propranolol Hcl     Felt bad  . Vytorin [Ezetimibe-Simvastatin]     Myalgias    Past Medical History  Diagnosis Date  . Diverticulosis   . Hiatal hernia   . Hypothyroidism   . Personal history of colonic polyps     a. colonoscopy 2009  . Personal history of malignant neoplasm of breast   . Hemorrhoids   . Hyperlipidemia   . Palpitations     a. 04/2008 echo: EF 50-60%;  b. 21 day event monitor 2012 - occasional sinus tachycardia with rates 110-120's. Rare PAC's/PVC's. Serveral short 4-5 beat runs of a-tach.  . Irritable bowel syndrome   . Hyperlipidemia     Is now taking Fish oil. Had myalgias with Vytorin and Lipitor  . GERD (gastroesophageal reflux disease)     a. mild esophagitis on egd 2012  . Pre-diabetes   . Chest pain     a.  negative myoview 2011 [name]);  b. 04/2010 Cath: normal cors   Past Surgical History  Procedure Laterality Date  . Nasal sinus surgery    . Tonsillectomy    . Carpal tunnel release      bilateral   . Colonoscopy    . Bunionectomy     Review of Systems 10 point systems review negative except as above.    Objective:   Physical Exam  BP 122/78 mmHg  Pulse 76  Temp(Src) 97.9 F (36.6 C)  Resp 16  Ht 5\' 2"  (1.575 m)  Wt 105 lb 6.4 oz (47.809 kg)  BMI 19.27 kg/m2  In no distress. Skin w/o rash, cyanosis. Hacking cough.  HEENT - Eac's patent. TM's Nl. EOM's full. Tender left > right maxillary areaPERRLA. NasoOroPharynx clear. Neck - supple. Nl Thyroid. Carotids 2+ & No bruits, nodes, JVD Chest - Clear equal BS w/o Rales, rhonchi, wheezes. Cor - Nl HS. RRR w/o sig MGR. PP 1(+). No edema. MS- FROM w/o deformities. Muscle power, tone and bulk Nl. Gait Nl. Neuro - No obvious Cr N abnormalities. Sensory, motor  and Cerebellar functions appear Nl w/o focal abnormalities. Psyche - Mental status normal & appropriate.  No delusions, ideations or obvious mood abnormalities.    Assessment & Plan:   1. Left maxillary sinusitis  - azithromycin (ZITHROMAX) 250 MG tablet; Take 2 tablets (500 mg) on  Day 1,  followed by 1 tablet (250 mg) once daily on Days 2 through 5.  Dispense: 6 each; Refill: 1 - dexamethasone (DECADRON) 0.5 MG tablet; Take 1 tablet 3 x day for 3 days, then 2 x day for 3 days, then 1 daily for 5 days  Dispense: 20 tablet; Refill: 0  2. Cough  - benzonatate (TESSALON) 100 MG capsule; Take 1 or 2 perles 3 x day to prevent cough  Dispense: 60 capsule; Refill: 1  3. Body mass index (BMI) of 19 or less in adult   4. Prediabetes

## 2014-11-06 NOTE — Patient Instructions (Signed)

## 2014-11-08 ENCOUNTER — Ambulatory Visit: Payer: Medicare Other | Admitting: Cardiology

## 2014-11-11 ENCOUNTER — Telehealth: Payer: Self-pay | Admitting: *Deleted

## 2014-11-11 MED ORDER — CEPHALEXIN 500 MG PO CAPS: 500.0000 mg | ORAL_CAPSULE | Freq: Four times a day (QID) | ORAL | Status: AC

## 2014-11-11 MED ORDER — LEVOFLOXACIN 500 MG PO TABS: 500.0000 mg | ORAL_TABLET | Freq: Every day | ORAL | Status: DC

## 2014-11-11 NOTE — Telephone Encounter (Signed)
Patient called and states Levaquin cause her to have nausea and vomiting.  Per Dr Melford Aase, discontinue the Levaquin and send in RX for Keflex.  Patient aware.

## 2014-11-11 NOTE — Telephone Encounter (Signed)
Patient called and states she finished the Z-pak and Prednisone and still has sinus congestion and feels dizzy.  OK to send in an RX for Levaquin.  Left a message to inform the patient.

## 2014-12-11 ENCOUNTER — Ambulatory Visit (INDEPENDENT_AMBULATORY_CARE_PROVIDER_SITE_OTHER): Payer: Medicare Other | Admitting: Physician Assistant

## 2014-12-11 ENCOUNTER — Encounter: Payer: Self-pay | Admitting: Physician Assistant

## 2014-12-11 VITALS — BP 126/70 | HR 74 | Temp 97.9°F | Resp 16 | Ht 62.0 in | Wt 106.2 lb

## 2014-12-11 DIAGNOSIS — R002 Palpitations: Secondary | ICD-10-CM

## 2014-12-11 DIAGNOSIS — Z23 Encounter for immunization: Secondary | ICD-10-CM | POA: Diagnosis not present

## 2014-12-11 DIAGNOSIS — Z111 Encounter for screening for respiratory tuberculosis: Secondary | ICD-10-CM | POA: Diagnosis not present

## 2014-12-11 DIAGNOSIS — E039 Hypothyroidism, unspecified: Secondary | ICD-10-CM

## 2014-12-11 NOTE — Progress Notes (Signed)
Subjective:    Patient ID: Andrea Bates, female    DOB: 1945/04/04, 69 y.o.   MRN: [id number]  HPI 69 y.o. 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 She needs tetanus and PPD. She is also on thyroid medication, it was changed last time to 25 mcg daily. She has elevated cholesterol but declines medications, understands risk of MI/stroke.  Lab Results  Component Value Date   TSH 1.617 08/12/2014    Blood pressure 126/70, pulse 74, temperature 97.9 F (36.6 C), resp. rate 16, height 5\' 2"  (1.575 m), weight 106 lb 3.2 oz (48.172 kg).  Immunization History  Administered Date(s) Administered  . Td 11/19/2002    Past Medical History  Diagnosis Date  . Diverticulosis   . Hiatal hernia   . Hypothyroidism   . Personal history of colonic polyps     a. colonoscopy 2009  . Personal history of malignant neoplasm of breast   . Hemorrhoids   . Hyperlipidemia   . Palpitations     a. 04/2008 echo: EF 50-60%;  b. 21 day event monitor 2012 - occasional sinus tachycardia with rates 110-120's. Rare PAC's/PVC's. Serveral short 4-5 beat runs of a-tach.  . Irritable bowel syndrome   . Hyperlipidemia     Is now taking Fish oil. Had myalgias with Vytorin and Lipitor  . GERD (gastroesophageal reflux disease)     a. mild esophagitis on egd 2012  . Pre-diabetes   . Chest pain     a. negative myoview 2011 [name]);  b. 04/2010 Cath: normal cors   Current Outpatient Prescriptions on File Prior to Visit  Medication Sig Dispense Refill  . Calcium Carbonate-Vitamin D (CALCIUM + D PO) Take 1 tablet by mouth daily.     . cholecalciferol (VITAMIN D) 1000 UNITS tablet Take 4,000 Units by mouth daily.     . Coenzyme Q10 (COQ10) 100 MG CAPS Take 100 mg by mouth 3 (three) times daily.    . fish oil-omega-3 fatty acids 1000 MG capsule Take 2 g by mouth daily.     . Flaxseed, Linseed, (FLAX SEED OIL) 1000 MG CAPS Take 1 capsule by mouth daily.     Marland Kitchen levothyroxine (SYNTHROID,  LEVOTHROID) 50 MCG tablet Take 1 tablet (50 mcg total) by mouth daily before breakfast. 90 tablet 1  . MAGNESIUM PO Take by mouth daily.    . mometasone (NASONEX) 50 MCG/ACT nasal spray Place 2 sprays into the nose daily.     No current facility-administered medications on file prior to visit.   Review of Systems  Constitutional: Negative.   HENT: Negative.   Respiratory: Negative.   Cardiovascular: Positive for palpitations. Negative for chest pain and leg swelling.  Gastrointestinal: Positive for nausea. Negative for vomiting, abdominal pain, diarrhea, constipation, blood in stool, abdominal distention, anal bleeding and rectal pain.  Genitourinary: Negative.   Musculoskeletal: Negative.   Skin: Negative.   Neurological: Negative.   Hematological: Negative.   Psychiatric/Behavioral: Negative.        Objective:   Physical Exam  Constitutional: She is oriented to person, place, and time. She appears well-developed and well-nourished.  HENT:  Head: Normocephalic and atraumatic.  Right Ear: External ear normal.  Left Ear: External ear normal.  Mouth/Throat: Oropharynx is clear and moist.  Eyes: Conjunctivae and EOM are normal. Pupils are equal, round, and reactive to light.  Neck: Normal range of motion. Neck supple. No thyromegaly present.  Cardiovascular: Normal rate, regular rhythm and normal  heart sounds.  Exam reveals no gallop and no friction rub.   No murmur heard. Pulmonary/Chest: Effort normal and breath sounds normal. No respiratory distress. She has no wheezes.  Abdominal: Soft. Bowel sounds are normal. She exhibits no distension and no mass. There is no tenderness. There is no rebound and no guarding.  Musculoskeletal: Normal range of motion.  Lymphadenopathy:    She has no cervical adenopathy.  Neurological: She is alert and oriented to person, place, and time. She displays normal reflexes. No cranial nerve deficit. Coordination normal.  Skin: Skin is warm and dry.   Psychiatric: She has a normal mood and affect.       Assessment & Plan:  1. Hypothyroidism, unspecified hypothyroidism type - TSH  2. Palpitations Normal work up, likely anxiety versus GERD, try vinegar/prevacid, and going back to work to help with anxiety and suggest continue [place] wart.   3. Need for prophylactic vaccination with combined diphtheria-tetanus-pertussis (DTP) vaccine - Dt vaccine greater than 7yo IM  4. Screening for tuberculosis - TB Skin Test   Future Appointments Date Time Provider [place]  12/20/2014 2:45 PM Larey Dresser, MD [id number]  05/20/2015 9:00 AM Vicie Mutters, PA-C [id number] None

## 2014-12-13 ENCOUNTER — Ambulatory Visit: Payer: PPO | Admitting: *Deleted

## 2014-12-13 DIAGNOSIS — E039 Hypothyroidism, unspecified: Secondary | ICD-10-CM

## 2014-12-13 LAB — TSH: TSH: 1.46 u[IU]/mL (ref 0.350–4.500)

## 2014-12-13 LAB — TB SKIN TEST
Induration: 0 mm
TB SKIN TEST: NEGATIVE

## 2014-12-13 NOTE — Progress Notes (Signed)
Patient ID: Andrea Bates, female   DOB: 02-18-1946, 69 y.o.   MRN: [id number] Patient presents to read TB and have TSH drawn per Vicie Mutters, PA-C orders.  TB was negative and written documentation was provided to patient.

## 2014-12-16 ENCOUNTER — Encounter: Payer: Self-pay | Admitting: Physician Assistant

## 2014-12-20 ENCOUNTER — Ambulatory Visit (INDEPENDENT_AMBULATORY_CARE_PROVIDER_SITE_OTHER): Payer: Medicare Other | Admitting: Cardiology

## 2014-12-20 ENCOUNTER — Encounter: Payer: Self-pay | Admitting: Cardiology

## 2014-12-20 VITALS — BP 134/74 | HR 74 | Ht 62.0 in | Wt 105.0 lb

## 2014-12-20 DIAGNOSIS — E785 Hyperlipidemia, unspecified: Secondary | ICD-10-CM | POA: Diagnosis not present

## 2014-12-20 DIAGNOSIS — R002 Palpitations: Secondary | ICD-10-CM

## 2014-12-20 NOTE — Patient Instructions (Signed)
Medication Instructions:  No changes today  Labwork: None today  Testing/Procedures: None today  Follow-Up: Your physician wants you to follow-up in: 1 year with Dr McLean. (September 2017). You will receive a reminder letter in the mail two months in advance. If you don't receive a letter, please call our office to schedule the follow-up appointment.      

## 2014-12-22 NOTE — Progress Notes (Signed)
Patient ID: Andrea Bates, female   DOB: [birth year], 69 y.o.   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 She underwent cardiac catheterization in 2/12 which showed normal coronaries and normal LV systolic function.  She has had a lot of problems with symptomatic PACs and PVCs in the past.  She has been intolerant of both beta blockers and calcium channel blockers.  She had a stress echo in 4/15 with 12' exercise, no evidence for ischemia or infarction.   She continues to be anxious. She continues to have palpitations, similar to the past.  Her heart will "skip" beats for a few minutes usually 2-3 hours after eating.  She has epigastric pressure after eating that resolves with belching.  She has occasional spells of lightheadedness with standing.  BP is not low.  Recent echo in 5/16 showed normal EF and holter in 5/16 showed few PACs and PVCs.   Labs (2/12): TSH, free T4, free T3 all normal. K 3.9, creatinine 0.62.  Labs (3/12): K 4.8, creatinine 0.6, Mg 2.5, urine catecholeamines normal  Labs (3/15): LDL 180 Labs (6/15): K 4.2, creatinine 0.66, TSH normal Labs (12/15): Urinary catecholeamines normal, HCT 38.3, TSH normal, creatinine 0.7 Labs (5/16): LDL 181, HDL 76  Allergies (verified):  1) * Shellfish   Past Medical History:  1. Hyperlipidemia: She had myalgias with Vytorin and Lipitor.  She had vertigo with Crestor.  2. Hypothyroidism.  3. Diverticulosis.  4. Anxiety.  5. IBS  6. Colonic polyps. ADENOMATOUS-LAST COLON 11/2007  7. GERD, esophagitis on EGD 8. Chest pain: [facility]oview at [facility] in 2011 was normal. LHC (2/12) showed normal coronaries, EF 65%.  Stress echo (4/15) with 12' exercise, no evidence for ischemia or infarction.  9. Echo (2/10): EF 33-82%, grade I diastolic dysfunction.  Echo (5/16) with EF 55-60%.  10. Palpitations: 3 week  event monitor (2/12) with occasional sinus tachycardia with rates 110s-120s. Rare PACs/PVCs. Several short 4-5 beat runs of atrial tachycardia.  3-week event monitor in 6/13 showed occasioanl PVCs and PACs. Holter (5/16) with few PACs and PVCs.  11. Sleep study normal 2/12  12. Allergic rhinitis 13. Diverticulosis  Family History:  Father with h/o 2 heart valve replacements. He also had a stroke. Brother with MI at age 41.  Family History of Colon Cancer:Great Uncle on Paternal Side  grandother had a goiter.   Social History:  Lives with [place] in [place]. Works in a Special educational needs teacher. Never smoked. No ETOH.   ROS:  All systems reviewed and negative except as per HPI.    Current Outpatient Prescriptions  Medication Sig Dispense Refill  . Calcium Carbonate-Vitamin D (CALCIUM + D PO) Take 1 tablet by mouth daily.     . cholecalciferol (VITAMIN D) 1000 UNITS tablet Take 4,000 Units by mouth daily.     . Coenzyme Q10 (COQ10) 100 MG CAPS Take 100 mg by mouth 3 (three) times daily.    . fish oil-omega-3 fatty acids 1000 MG capsule Take 2 g by mouth daily.     . Flaxseed, Linseed, (FLAX SEED OIL) 1000 MG CAPS Take 1 capsule by mouth daily.     Marland Kitchen levothyroxine (SYNTHROID, LEVOTHROID) 50 MCG tablet Take 25 mcg by mouth daily before breakfast.    . MAGNESIUM PO Take by mouth daily.    . mometasone (NASONEX) 50 MCG/ACT nasal spray Place 2  sprays into the nose daily.     No current facility-administered medications for this visit.    BP 134/74 mmHg  Pulse 74  Ht 5\' 2"  (1.575 m)  Wt 105 lb (47.628 kg)  BMI 19.20 kg/m2 General: NAD Neck: No JVD, no thyromegaly or thyroid nodule.  Lungs: Clear to auscultation bilaterally with normal respiratory effort. CV: Nondisplaced PMI.  Heart regular S1/S2, no S3/S4, no murmur.  No peripheral edema.  No carotid bruit.  Normal pedal pulses.  Abdomen: Soft, nontender, no hepatosplenomegaly, no distention.  Neurologic: Alert and oriented x 3.  Psych:  Anxious Extremities: No clubbing or cyanosis.   Assessment/Plan: 1. Palpitations: Last holter with few PACs and PVCs.  The symptoms bother her but she will not take a beta blocker.  I do not think that she has any dangerous arrhythmias.  2. CAD risk: Family history of premature CAD.  Normal cardiac cath in 2/12 and normal stress echo in 4/15.  She cannot tolerate statins.  LDL is very high.  She has not wanted to start Zetia and would not qualify for PCSK9-inhibitors.  I asked her to try to make dietary changes. 3. Anxiety: I think that this plays a major role in her symptoms generally.   Loralie Champagne 12/22/2014

## 2015-04-15 ENCOUNTER — Other Ambulatory Visit: Payer: Self-pay

## 2015-04-15 MED ORDER — LEVOTHYROXINE SODIUM 50 MCG PO TABS: 25.0000 ug | ORAL_TABLET | Freq: Every day | ORAL | Status: DC

## 2015-05-20 ENCOUNTER — Encounter: Payer: Self-pay | Admitting: Physician Assistant

## 2015-05-26 ENCOUNTER — Other Ambulatory Visit: Payer: Self-pay | Admitting: Physician Assistant

## 2015-06-18 ENCOUNTER — Encounter: Payer: Self-pay | Admitting: Internal Medicine

## 2015-06-18 ENCOUNTER — Ambulatory Visit (INDEPENDENT_AMBULATORY_CARE_PROVIDER_SITE_OTHER): Payer: PPO | Admitting: Internal Medicine

## 2015-06-18 VITALS — BP 128/76 | HR 64 | Temp 97.7°F | Resp 16 | Ht 62.0 in | Wt 106.8 lb

## 2015-06-18 DIAGNOSIS — R002 Palpitations: Secondary | ICD-10-CM | POA: Diagnosis not present

## 2015-06-18 DIAGNOSIS — Z79899 Other long term (current) drug therapy: Secondary | ICD-10-CM

## 2015-06-18 DIAGNOSIS — E039 Hypothyroidism, unspecified: Secondary | ICD-10-CM

## 2015-06-18 DIAGNOSIS — F411 Generalized anxiety disorder: Secondary | ICD-10-CM

## 2015-06-18 DIAGNOSIS — R5383 Other fatigue: Secondary | ICD-10-CM | POA: Diagnosis not present

## 2015-06-18 DIAGNOSIS — Z136 Encounter for screening for cardiovascular disorders: Secondary | ICD-10-CM

## 2015-06-18 LAB — CBC WITH DIFFERENTIAL/PLATELET
BASOS ABS: 0.1 10*3/uL (ref 0.0–0.1)
BASOS PCT: 1 % (ref 0–1)
Eosinophils Absolute: 0.1 10*3/uL (ref 0.0–0.7)
Eosinophils Relative: 1 % (ref 0–5)
HCT: 41.1 % (ref 36.0–46.0)
HEMOGLOBIN: 13.7 g/dL (ref 12.0–15.0)
Lymphocytes Relative: 29 % (ref 12–46)
Lymphs Abs: 1.9 10*3/uL (ref 0.7–4.0)
MCH: 29.4 pg (ref 26.0–34.0)
MCHC: 33.3 g/dL (ref 30.0–36.0)
MCV: 88.2 fL (ref 78.0–100.0)
MONOS PCT: 9 % (ref 3–12)
MPV: 9.8 fL (ref 8.6–12.4)
Monocytes Absolute: 0.6 10*3/uL (ref 0.1–1.0)
NEUTROS ABS: 4 10*3/uL (ref 1.7–7.7)
NEUTROS PCT: 60 % (ref 43–77)
Platelets: 346 10*3/uL (ref 150–400)
RBC: 4.66 MIL/uL (ref 3.87–5.11)
RDW: 13.9 % (ref 11.5–15.5)
WBC: 6.6 10*3/uL (ref 4.0–10.5)

## 2015-06-18 MED ORDER — SERTRALINE HCL 25 MG PO TABS: 25.0000 mg | ORAL_TABLET | Freq: Every day | ORAL | Status: DC

## 2015-06-18 NOTE — Progress Notes (Signed)
Subjective:    Patient ID: Andrea Bates, female    DOB: [birth year], 70 y.o.   MRN: [id number]  HPI  This delightful 70 yo MWF is seen as an emergent w/in for her chronic c/o anxiety. Patient has had an extensive thorough cardiac w/u on several occasions in the past by Dr Aundra Dubin for her very symptomatic palpitations with a normal heart cath in 2012, negative stress cardiac echo in Apr 2015 and a negative Holter monitor in May 2016. Patient has been intolerant to both BB & CCB's in the past and has likewise been resilient to taking anti-anxiety medications in the past in deference to her preference to take various homeopathic remedies. Today she's c/o  "heart skips, slow pulse in the 50's and feeling light-headed". Denies any exertional component, Nausea, Dyspnea or Diaphoresis. On her own recognizance she felt her thyroid might be low and she recently self increased her thyroxine 50 mcg from 1/2 tab (25 mcg) up to 3/4 tab (37.5 mcg).   Medication Sig  . CALCIUM + D  Take 1 tablet by mouth daily.   Marland Kitchen VITAMIN D  Take 4,000 Units by mouth daily.   . Coenzyme Q10  100 MG CAPS Take 100 mg by mouth 3 (three) times daily.  . fish oil-omega-3  1000 MG capsule Take 2 g by mouth daily.   Marland Kitchen FLAX SEED OIL 1000 MG CAPS Take 1 capsule by mouth daily.   Marland Kitchen MAGNESIUM  Take by mouth daily.  Marland Kitchen NASONEX  nasal spray Place 2 sprays into the nose daily.  Marland Kitchen SYNTHROID 50 MCG tablet TAKE 1/2 TAB (25 MCG)  DAILY    Allergies  Allergen Reactions  . Armour Thyroid [Thyroid]     Palpitations   . Atenolol     presyncope  . Celexa [Citalopram Hydrobromide]     Severe chest tightness   . Diltiazem Hcl     Dropped bp  . Levaquin [Levofloxacin In D5w] Nausea And Vomiting  . Lipitor [Atorvastatin]     Myalgias   . Propranolol Hcl     Felt bad  . Vytorin [Ezetimibe-Simvastatin]     Myalgias    Past Medical History  Diagnosis Date  . Diverticulosis   . Hiatal hernia   . Hypothyroidism   . Personal history  of colonic polyps     a. colonoscopy 2009  . Personal history of malignant neoplasm of breast   . Hemorrhoids   . Hyperlipidemia   . Palpitations     a. 04/2008 echo: EF 50-60%;  b. 21 day event monitor 2012 - occasional sinus tachycardia with rates 110-120's. Rare PAC's/PVC's. Serveral short 4-5 beat runs of a-tach.  . Irritable bowel syndrome   . Hyperlipidemia     Is now taking Fish oil. Had myalgias with Vytorin and Lipitor  . GERD (gastroesophageal reflux disease)     a. mild esophagitis on egd 2012  . Pre-diabetes   . Chest pain     a. negative myoview 2011 [name]);  b. 04/2010 Cath: normal cors   Review of Systems  10 point systems review negative except as above.    Objective:   Physical Exam  BP 128/76 mmHg  Pulse 64  Temp(Src) 97.7 F (36.5 C)  Resp 16  Ht 5\' 2"  (1.575 m)  Wt 106 lb 12.8 oz (48.444 kg)  BMI 19.53 kg/m2  HEENT - Eac's patent. TM's Nl. EOM's full. PERRLA. NasoOroPharynx clear. Neck - supple. Nl Thyroid. Carotids 2+ & No bruits, nodes,  JVD Chest - Clear equal BS w/o Rales, rhonchi, wheezes. Cor - Nl HS. RRR w/o sig MGR. PP 1(+). No edema. Abd - No palpable organomegaly, masses or tenderness. BS nl. MS- FROM w/o deformities. Muscle power, tone and bulk Nl. Gait Nl. Neuro - No obvious Cr N abnormalities. Sensory, motor and Cerebellar functions appear Nl w/o focal abnormalities. Psyche - Mental status- anxious.     Assessment & Plan:   1. Hypothyroidism, unspecified hypothyroidism type  - TSH  2. Other fatigue  - CBC with Differential/Platelet - TSH  3. Palpitations  - TSH - EKG 12-Lead  4. Anxiety state   - with pleading & much encouragement, patient did agree to try Rx Sertraline 25 mg - explaining to her that it would need to be taken at least 6 weeks.   5. Medication management  - CBC with Differential/Platelet - BASIC METABOLIC PANEL WITH GFR - Hepatic function panel - Magnesium

## 2015-06-18 NOTE — Patient Instructions (Signed)
Hypothyroidism Hypothyroidism is a disorder of the thyroid. The thyroid is a large gland that is located in the lower front of the neck. The thyroid releases hormones that control how the body works. With hypothyroidism, the thyroid does not make enough of these hormones. CAUSES Causes of hypothyroidism may include:  Viral infections.  Pregnancy.  Your own defense system (immune system) attacking your thyroid.  Certain medicines.  Birth defects.  Past radiation treatments to your head or neck.  Past treatment with radioactive iodine.  Past surgical removal of part or all of your thyroid.  Problems with the gland that is located in the center of your brain (pituitary). SIGNS AND SYMPTOMS Signs and symptoms of hypothyroidism may include:  Feeling as though you have no energy (lethargy).  Inability to tolerate cold.  Weight gain that is not explained by a change in diet or exercise habits.  Dry skin.  Coarse hair.  Menstrual irregularity.  Slowing of thought processes.  Constipation.  Sadness or depression. DIAGNOSIS  Your health care provider may diagnose hypothyroidism with blood tests and ultrasound tests. TREATMENT Hypothyroidism is treated with medicine that replaces the hormones that your body does not make. After you begin treatment, it may take several weeks for symptoms to go away. HOME CARE INSTRUCTIONS   Take medicines only as directed by your health care provider.  If you start taking any new medicines, tell your health care provider.  Keep all follow-up visits as directed by your health care provider. This is important. As your condition improves, your dosage needs may change. You will need to have blood tests regularly so that your health care provider can watch your condition.   Your symptoms do not get better with treatment.  You are taking thyroid replacement medicine and:  You sweat excessively.  You have tremors.  You feel anxious.  You  lose weight rapidly.  You cannot tolerate heat.  You have emotional swings.  You have diarrhea.  You feel weak.    ++++++++++++++++++++++++++++++++++++++++++++++++++++++++++++++++++++++++++++  Generalized Anxiety Disorder Generalized anxiety disorder (GAD) is a mental disorder. It interferes with life functions, including relationships, work, and school. GAD is different from normal anxiety, which everyone experiences at some point in their lives in response to specific life events and activities. Normal anxiety actually helps [place] prepare for and get through these life events and activities. Normal anxiety goes away after the event or activity is over.  GAD causes anxiety that is not necessarily related to specific events or activities. It also causes excess anxiety in proportion to specific events or activities. The anxiety associated with GAD is also difficult to control. GAD can vary from mild to severe. People with severe GAD can have intense waves of anxiety with physical symptoms (panic attacks).  SYMPTOMS The anxiety and worry associated with GAD are difficult to control. This anxiety and worry are related to many life events and activities and also occur more days than not for 6 months or longer. People with GAD also have three or more of the following symptoms (one or more in children):  Restlessness.   Fatigue.  Difficulty concentrating.   Irritability.  Muscle tension.  Difficulty sleeping or unsatisfying sleep. DIAGNOSIS GAD is diagnosed through an assessment by your health care provider. Your health care provider will ask you questions aboutyour mood,physical symptoms, and events in your life. Your health care provider may ask you about your medical history and use of alcohol or drugs, including prescription medicines. Your health care provider  may also do a physical exam and blood tests. Certain medical conditions and the use of certain substances can cause symptoms  similar to those associated with GAD. Your health care provider may refer you to a mental health specialist for further evaluation. TREATMENT The following therapies are usually used to treat GAD:   Medication. Antidepressant medication usually is prescribed for long-term daily control. Antianxiety medicines may be added in severe cases, especially when panic attacks occur.   Talk therapy (psychotherapy). Certain types of talk therapy can be helpful in treating GAD by providing support, education, and guidance. A form of talk therapy called cognitive behavioral therapy can teach you healthy ways to think about and react to daily life events and activities.  Stress managementtechniques. These include yoga, meditation, and exercise and can be very helpful when they are practiced regularly. A mental health specialist can help determine which treatment is best for you. Some people see improvement with one therapy. However, other people require a combination of therapies.

## 2015-06-19 LAB — BASIC METABOLIC PANEL WITH GFR
BUN: 21 mg/dL (ref 7–25)
CHLORIDE: 101 mmol/L (ref 98–110)
CO2: 25 mmol/L (ref 20–31)
Calcium: 10.4 mg/dL (ref 8.6–10.4)
Creat: 0.71 mg/dL (ref 0.50–0.99)
GFR, EST NON AFRICAN AMERICAN: 88 mL/min (ref >=60)
GFR, Est African American: >89 mL/min (ref >=60)
Glucose, Bld: 91 mg/dL (ref 65–99)
POTASSIUM: 4.3 mmol/L (ref 3.5–5.3)
SODIUM: 140 mmol/L (ref 135–146)

## 2015-06-19 LAB — HEPATIC FUNCTION PANEL
ALBUMIN: 4.8 g/dL (ref 3.6–5.1)
ALT: 24 U/L (ref 6–29)
AST: 20 U/L (ref 10–35)
Alkaline Phosphatase: 101 U/L (ref 33–130)
BILIRUBIN DIRECT: 0.1 mg/dL (ref <=0.2)
BILIRUBIN INDIRECT: 0.3 mg/dL (ref 0.2–1.2)
Total Bilirubin: 0.4 mg/dL (ref 0.2–1.2)
Total Protein: 7.5 g/dL (ref 6.1–8.1)

## 2015-06-19 LAB — MAGNESIUM: Magnesium: 2.3 mg/dL (ref 1.5–2.5)

## 2015-06-19 LAB — TSH: TSH: 1.61 mIU/L

## 2015-06-23 ENCOUNTER — Encounter: Payer: Self-pay | Admitting: Internal Medicine

## 2015-06-25 ENCOUNTER — Other Ambulatory Visit: Payer: Self-pay | Admitting: *Deleted

## 2015-06-25 MED ORDER — LEVOTHYROXINE SODIUM 50 MCG PO TABS: 50.0000 ug | ORAL_TABLET | Freq: Every day | ORAL | Status: DC

## 2015-07-03 ENCOUNTER — Encounter: Payer: Self-pay | Admitting: Physician Assistant

## 2015-07-15 ENCOUNTER — Telehealth: Payer: Self-pay | Admitting: Internal Medicine

## 2015-07-15 NOTE — Telephone Encounter (Signed)
Pt states she is having lots of issues with epigastric pain, reflux and now is having left sided abd pain that comes and goes. Pt states she has been taking mylanta but it is not helping. Pt scheduled to see Alonza Bogus PA 07/18/15@10 :30am. Pt aware of appt.

## 2015-07-16 ENCOUNTER — Encounter: Payer: Self-pay | Admitting: Physician Assistant

## 2015-07-16 ENCOUNTER — Ambulatory Visit (INDEPENDENT_AMBULATORY_CARE_PROVIDER_SITE_OTHER): Payer: PPO | Admitting: Physician Assistant

## 2015-07-16 VITALS — BP 108/60 | HR 98 | Temp 97.3°F | Resp 16 | Ht 62.0 in | Wt 103.2 lb

## 2015-07-16 DIAGNOSIS — R42 Dizziness and giddiness: Secondary | ICD-10-CM

## 2015-07-16 DIAGNOSIS — R5383 Other fatigue: Secondary | ICD-10-CM

## 2015-07-16 DIAGNOSIS — E785 Hyperlipidemia, unspecified: Secondary | ICD-10-CM | POA: Diagnosis not present

## 2015-07-16 DIAGNOSIS — Z79899 Other long term (current) drug therapy: Secondary | ICD-10-CM

## 2015-07-16 DIAGNOSIS — E039 Hypothyroidism, unspecified: Secondary | ICD-10-CM | POA: Diagnosis not present

## 2015-07-16 DIAGNOSIS — E559 Vitamin D deficiency, unspecified: Secondary | ICD-10-CM | POA: Diagnosis not present

## 2015-07-16 DIAGNOSIS — R7309 Other abnormal glucose: Secondary | ICD-10-CM | POA: Diagnosis not present

## 2015-07-16 DIAGNOSIS — R232 Flushing: Secondary | ICD-10-CM

## 2015-07-16 DIAGNOSIS — Z860100 Personal history of colon polyps, unspecified: Secondary | ICD-10-CM

## 2015-07-16 DIAGNOSIS — K5900 Constipation, unspecified: Secondary | ICD-10-CM

## 2015-07-16 DIAGNOSIS — R1310 Dysphagia, unspecified: Secondary | ICD-10-CM | POA: Diagnosis not present

## 2015-07-16 DIAGNOSIS — R55 Syncope and collapse: Secondary | ICD-10-CM

## 2015-07-16 DIAGNOSIS — R6889 Other general symptoms and signs: Secondary | ICD-10-CM | POA: Diagnosis not present

## 2015-07-16 DIAGNOSIS — Z0000 Encounter for general adult medical examination without abnormal findings: Secondary | ICD-10-CM

## 2015-07-16 DIAGNOSIS — K5730 Diverticulosis of large intestine without perforation or abscess without bleeding: Secondary | ICD-10-CM | POA: Diagnosis not present

## 2015-07-16 DIAGNOSIS — Z0001 Encounter for general adult medical examination with abnormal findings: Secondary | ICD-10-CM | POA: Diagnosis not present

## 2015-07-16 DIAGNOSIS — Z853 Personal history of malignant neoplasm of breast: Secondary | ICD-10-CM | POA: Diagnosis not present

## 2015-07-16 DIAGNOSIS — R002 Palpitations: Secondary | ICD-10-CM

## 2015-07-16 DIAGNOSIS — R7303 Prediabetes: Secondary | ICD-10-CM | POA: Diagnosis not present

## 2015-07-16 LAB — HEPATIC FUNCTION PANEL
ALT: 19 U/L (ref 6–29)
AST: 18 U/L (ref 10–35)
Albumin: 4.3 g/dL (ref 3.6–5.1)
Alkaline Phosphatase: 94 U/L (ref 33–130)
BILIRUBIN DIRECT: 0.1 mg/dL (ref <=0.2)
BILIRUBIN INDIRECT: 0.3 mg/dL (ref 0.2–1.2)
TOTAL PROTEIN: 7.2 g/dL (ref 6.1–8.1)
Total Bilirubin: 0.4 mg/dL (ref 0.2–1.2)

## 2015-07-16 LAB — CBC WITH DIFFERENTIAL/PLATELET
BASOS ABS: 57 {cells}/uL (ref 0–200)
Basophils Relative: 1 %
EOS ABS: 57 {cells}/uL (ref 15–500)
Eosinophils Relative: 1 %
HEMATOCRIT: 40.1 % (ref 35.0–45.0)
HEMOGLOBIN: 13.8 g/dL (ref 11.7–15.5)
LYMPHS ABS: 1368 {cells}/uL (ref 850–3900)
LYMPHS PCT: 24 %
MCH: 30.1 pg (ref 27.0–33.0)
MCHC: 34.4 g/dL (ref 32.0–36.0)
MCV: 87.6 fL (ref 80.0–100.0)
MONO ABS: 570 {cells}/uL (ref 200–950)
MPV: 9.5 fL (ref 7.5–12.5)
Monocytes Relative: 10 %
NEUTROS PCT: 64 %
Neutro Abs: 3648 cells/uL (ref 1500–7800)
Platelets: 341 10*3/uL (ref 140–400)
RBC: 4.58 MIL/uL (ref 3.80–5.10)
RDW: 13.7 % (ref 11.0–15.0)
WBC: 5.7 10*3/uL (ref 3.8–10.8)

## 2015-07-16 LAB — BASIC METABOLIC PANEL WITH GFR
BUN: 20 mg/dL (ref 7–25)
CALCIUM: 9.6 mg/dL (ref 8.6–10.4)
CO2: 28 mmol/L (ref 20–31)
Chloride: 102 mmol/L (ref 98–110)
Creat: 0.76 mg/dL (ref 0.50–0.99)
GFR, Est Non African American: 81 mL/min (ref >=60)
GLUCOSE: 89 mg/dL (ref 65–99)
Potassium: 4.2 mmol/L (ref 3.5–5.3)
Sodium: 139 mmol/L (ref 135–146)

## 2015-07-16 LAB — LIPID PANEL
Cholesterol: 255 mg/dL — ABNORMAL HIGH (ref 125–200)
HDL: 79 mg/dL (ref >=46)
LDL CALC: 165 mg/dL — AB (ref <130)
Total CHOL/HDL Ratio: 3.2 Ratio (ref <=5.0)
Triglycerides: 55 mg/dL (ref <150)
VLDL: 11 mg/dL (ref <30)

## 2015-07-16 LAB — HEMOGLOBIN A1C
HEMOGLOBIN A1C: 5.2 % (ref <5.7)
MEAN PLASMA GLUCOSE: 103 mg/dL

## 2015-07-16 LAB — MAGNESIUM: MAGNESIUM: 2.4 mg/dL (ref 1.5–2.5)

## 2015-07-16 MED ORDER — SUCRALFATE 1 G PO TABS: 1.0000 g | ORAL_TABLET | Freq: Three times a day (TID) | ORAL | Status: DC | PRN

## 2015-07-16 MED ORDER — PANTOPRAZOLE SODIUM 40 MG PO TBEC: 40.0000 mg | DELAYED_RELEASE_TABLET | Freq: Every day | ORAL | Status: DC

## 2015-07-16 NOTE — Patient Instructions (Signed)
[facility]  7 a.m.-6:30 p.m., Monday 7 a.m.-5 p.m., Tuesday-Friday Schedule an appointment by calling [phone].  [facility] Schedule an appointment by calling [phone].  Encourage you to get the 3D Mammogram  The 3D Mammogram is much more specific and sensitive to pick up breast cancer. For women with fibrocystic breast or lumpy breast it can be hard to determine if it is cancer or not but the 3D mammogram is able to tell this difference which cuts back on unneeded additional tests or scary call backs.   - over 40% increase in detection of breast cancer - over 40% reduction in false positives.  - fewer call backs - reduced anxiety - improved outcomes - [facility]  Esophageal Spasm An esophageal spasm is a muscle spasm of the tube that connects the back of your throat to your stomach (esophagus). Your esophagus normally moves food and liquids down into your stomach with smooth, wavelike muscle contractions. If you have esophageal spasms, abnormal muscle contractions in your esophagus can be painful and cause you to have difficulty swallowing (dysphagia). There are two types of esophageal spasms. You may have one or both types:  Diffuse esophageal spasms. These are irregular, uncoordinated muscle movements of the esophagus. The diffuse type causes more dysphagia.  Nutcracker esophagus. This is a type of muscle contraction that is coordinated but too strong. The muscles move in a regular order, but the contraction is stronger than necessary. The nutcracker type of esophageal spasm is more painful. Severe cases of esophageal spasm can make it hard to eat well and do all your usual activities. Esophageal spasms often occur along with severe heartburn (reflux esophagitis). Swallowed foods or liquids may also come back up into your throat (regurgitation).  CAUSES  The cause of esophageal spasm is not known. RISK FACTORS You may have a higher  risk for esophageal spasm if you:  Are female.  Are an older person.  Eat very quickly.  Swallow foods or drinks that are very hot or very cold.  Are depressed or anxious. SIGNS AND SYMPTOMS  Signs and symptoms can vary from day to day. They may be mild or severe. They may last for minutes or hours. Common signs and symptoms include:  Chest pain. This may feel like a heart attack.  Back pain.  Dysphagia.  Heartburn.  The feeling that something is stuck in your throat (globus).  Regurgitation of foods or liquids. DIAGNOSIS  Your health care provider may suspect esophageal spasm based on your symptoms and a physical exam. Tests may be done to help confirm the diagnosis. These may include:  Endoscopy. A flexible telescope is passed into your esophagus.  Barium swallow. An X-ray is done while you drink a substance (contrast material) that shows up on X-ray.  Esophageal manometry. Pressure measurements of the inside of your esophagus are taken while you swallow. TREATMENT  Mild cases of esophageal spasms may not need to be treated. You may be able to manage the spasms by avoiding the foods and eating habits that trigger them. Treatment for spasms that are more severe or frequent can include the following:  You may be given medicine to:  Relax the esophageal muscles.  Relieve muscle spasms (calcium channel blockers and nitrates).  Block nerve endings in the esophagus. This is done with an injection of a certain toxin (botulinum).  Relieve heartburn (proton pump inhibitors).  Antidepressant medicines are sometimes used to ease symptoms.  For severe cases, surgery  is sometimes done to reduce esophageal muscle contractions (myotomy). HOME CARE INSTRUCTIONS   Take medicines only as directed by your health care provider.  Do not eat foods that trigger heartburn or esophageal spasms.  Eat your meals slowly.  Chew your food completely.  Avoid foods and drinks that are  very hot or very cold.  Find ways to manage stress.  Ask for help if you struggle with depression or anxiety.  Keep all follow-up visits as directed by your health care provider. This is important. SEEK MEDICAL CARE IF:   Your symptoms change or get worse.  You are losing weight because of dysphagia.  Your medicine is not helping your symptoms.  Your esophageal spasms are interfering with your quality of life. SEEK IMMEDIATE MEDICAL CARE IF:   You have very bad or unusual chest pain.  You have trouble breathing.  You choke. MAKE SURE YOU:  Understand these instructions.  Will watch your condition.  Will get help right away if you are not doing well or get worse.   This information is not intended to replace advice given to you by your health care provider. Make sure you discuss any questions you have with your health care provider.   Document Released: 05/29/2002 Document Revised: 03/29/2014 Document Reviewed: 06/01/2013 Elsevier Interactive Patient Education [employer].

## 2015-07-16 NOTE — Progress Notes (Signed)
MEDICARE ANNUAL WELLNESS VISIT AND FOLLOW UP  Assessment:   1. Hyperlipidemia Intolerant to statins, declines treatment   2. Palpitations Normal cath, normal stress echo, normal holter LONG discussion about how possibly anxiety related and that we will not do anymore work ups unless there are new findings/symptoms ?  ESOPHAGEAL SPASM RELATED, START PPI AND GET BARIUM SWALLOW Declines CCB at this time - CBC with Differential/Platelet - BASIC METABOLIC PANEL WITH GFR - Hepatic function panel - TSH  3. PERSONAL HX BREAST CANCER OVER DUE FOR MGM, will get one  4. History of colonic polyps Up to date  5. Other fatigue Fatigue- observation for worsening or other new symptoms such as pain, fever or weight loss, considering a low dose SSRI antidepressant medication as treatment for nonspecific fatigue and LONG discussion that fatigue and anxiety can contribute to multitude of symptoms, reducing stress where possible and getting more rest and sleep  6. Prediabetes Better with diet.   7. Medication management Continue medication  8. VERTIGO Get on allergy pill, normal neuro  9. Hypothyroidism, unspecified hypothyroidism type will keep range closer to 4 due to anxiety/palpitations.  10. Constipation, unspecified constipation type Constipation- Increase fiber/ water intake, decrease caffeine, increase activity level, can add bulk (psyllium) until BM soft. Laboratory tests per orders. Please go to the hospital if you have severe abdominal pain, vomiting, fever, CP, SOB.   11. Dysphagia/GERD Has had normal EGD, ? From esophageal spasm versus Hiatal hernia- get barium swallow- follow up GI.   12. Diverticulosis of large intestine without hemorrhage Normal colonoscopy, increase fiber  15. Anxiety LONG discussion about anxiety/depresssion, patient is very tearful, did explain in full length/detail that all cardiac work ups have been negative and she is low risk Discussed finding a  medication is trial and error and she has to be willing to try Suggesting seeing psych/counseling Take zoloft at night since makes her tired.   16. Health Maintenance Patient declines, understands the risks of not getting the vaccine  17. Osteopenia Get DEXA    Plan:   During the course of the visit the patient was educated and counseled about appropriate screening and preventive services including:    Pneumococcal vaccine   Influenza vaccine  Td vaccine  Screening electrocardiogram  Screening mammography  Bone densitometry screening  Colorectal cancer screening  Diabetes screening  Glaucoma screening  Nutrition counseling   Advanced directives: given info/requested  Conditions/risks identified: She is not diabetic Urinary Incontinence is not an issue: discussed non pharmacology and pharmacology options.  Fall risk: low- discussed PT, home fall assessment, medications.    Subjective:   Andrea Bates is a 70 y.o. female who presents for Medicare Annual Wellness Visit and 3 month follow up on hypertension, hyperlipidemia, vitamin D def.  Date of last medicare wellness visit 05/2014  Her blood pressure has been controlled at home, today their BP is BP: 108/60 mmHg She does workout. She denies chest pain, shortness of breath, dizziness.  She states she has been having a lot of epigastric discomfort, worse at night while lying down, worse last 1-2 weeks, better with belching, feels "lump" when she swallows. She does have history of HH. She states 2 weeks her and her husband put out several bunches of hay, worse palpitations that night for 2-3 hours, had "long hard different beats" She has had extensive work ups from multiple doctors, has had Normal cath 04/2010, normal stress echo, normal holter, normal CT AB/pelvis/chest, normal colonoscopy/EGD, normal labs. She has  high anxiety about her heart and cancer, declines treatment. She started to take prevacid a week ago  and symptoms have not helped.  She is not on cholesterol medication and denies myalgias. Her cholesterol is at goal. The cholesterol last visit was:   Lab Results  Component Value Date   CHOL 262* 08/12/2014   HDL 70 08/12/2014   LDLCALC 181* 08/12/2014   LDLDIRECT 180.1 02/01/2013   TRIG 57 08/12/2014   CHOLHDL 3.7 08/12/2014   She has been working on diet and exercise for prediabetes, and denies paresthesia of the feet, polydipsia, polyuria and visual disturbances. Last A1C in the office was:  Lab Results  Component Value Date   HGBA1C 5.8* 08/12/2014   Patient is on Vitamin D supplement. She is on thyroid medication. Her medication was changed last visit, she is now on 40mcg 1/2 a pill a day, and did not take it for 3-4 days.   Lab Results  Component Value Date   TSH 1.61 06/18/2015  .    Medication Review Current Outpatient Prescriptions on File Prior to Visit  Medication Sig Dispense Refill  . Calcium Carbonate-Vitamin D (CALCIUM + D PO) Take 1 tablet by mouth daily.     . cholecalciferol (VITAMIN D) 1000 UNITS tablet Take 4,000 Units by mouth daily.     . Coenzyme Q10 (COQ10) 100 MG CAPS Take 100 mg by mouth 3 (three) times daily.    . fish oil-omega-3 fatty acids 1000 MG capsule Take 2 g by mouth daily.     . Flaxseed, Linseed, (FLAX SEED OIL) 1000 MG CAPS Take 1 capsule by mouth daily.     Marland Kitchen levothyroxine (SYNTHROID) 50 MCG tablet Take 1 tablet (50 mcg total) by mouth daily before breakfast. 30 tablet 2  . MAGNESIUM PO Take by mouth daily.    . mometasone (NASONEX) 50 MCG/ACT nasal spray Place 2 sprays into the nose daily.    . sertraline (ZOLOFT) 25 MG tablet Take 1 tablet (25 mg total) by mouth daily. 30 tablet 5   No current facility-administered medications on file prior to visit.    Current Problems (verified) Patient Active Problem List   Diagnosis Date Noted  . Flushing 02/28/2014  . Vitamin D Deficiency 06/06/2013  . Prediabetes 06/06/2013  . Medication  management 06/06/2013  . Fatigue 02/02/2013  . Dysphagia 07/05/2010  . Constipation 07/05/2010  . VERTIGO 06/08/2010  . Palpitations 06/02/2010  . Syncope (VasoVagal Faint) 04/27/2010  . Hypothyroidism 03/31/2009  . Diverticulosis of large intestine 03/31/2009  . PERSONAL HX BREAST CANCER 03/31/2009  . History of colonic polyps 03/31/2009  . Hyperlipidemia 05/07/2008    Screening Tests Immunization History  Administered Date(s) Administered  . DT 12/11/2014  . PPD Test 12/11/2014  . Td 11/19/2002   Preventative care: Last colonoscopy: 2015 EGD 2015 Last mammogram: OVER DUE Last pap smear/pelvic exam: 2 years  DEXA: OVER DUE Echo 06/2013 normal Holter monitor x 2 AB [place] 2008 CT AB/PELVIS- 07/2013 CT chest 2012 Gastric emptying 2012  Prior vaccinations: Patient declines, understands the risks of not getting the vaccine  Names of Other Physician/Practitioners you currently use: 1. [facility]- here for primary care 2. [facility], eye doctor, last visit Feb 2017 3. Dr. Nicki Reaper, dentist, last visit 4 years ago Patient Care Team: Unk Pinto, MD as PCP - General (Internal Medicine) Irene Shipper, MD as Consulting Physician (Gastroenterology) Larey Dresser, MD as Consulting Physician (Cardiology)  Past Surgical History  Procedure Laterality Date  . Nasal sinus surgery    . Tonsillectomy    . Carpal tunnel release      bilateral   . Colonoscopy    . Bunionectomy     Family History  Problem Relation Age of Onset  . Colon cancer      Paternal Andrea Bates   . Diabetes Paternal Grandmother   . Heart defect Father    Social History  Substance Use Topics  . Smoking status: Never Smoker   . Smokeless tobacco: Never Used  . Alcohol Use: No    MEDICARE WELLNESS OBJECTIVES: Tobacco use: She does not smoke.  Patient is not a former smoker. Alcohol Current alcohol use: none Caffeine Current caffeine use: denies  use Osteoporosis: postmenopausal estrogen deficiency and dietary calcium and/or vitamin D deficiency, History of fracture in the past year: no Diet: in general, a "healthy" diet   Physical activity: walking and exercise class Depression/mood screen:  Not Screened, declines Hearing: normal Visual acuity: normal,  does perform annual eye exam  ADLs: self care Fall risk: Low Risk Home safety: excellent Cognitive Testing  Alert? Yes  Normal Appearance?Yes  Oriented to person? Yes  Place? Yes   Time? Yes  Recall of three objects?  Yes  Can perform simple calculations? Yes  Displays appropriate judgment?Yes  Can read the correct time from a watch face?Yes EOL planning: No  and Information given   Objective:   Blood pressure 108/60, pulse 98, temperature 97.3 F (36.3 C), temperature source Temporal, resp. rate 16, height 5\' 2"  (1.575 m), weight 103 lb 3.2 oz (46.811 kg), SpO2 98 %. Body mass index is 18.87 kg/(m^2).  General appearance: alert, no distress, WD/WN,  female HEENT: normocephalic, sclerae anicteric, TMs pearly, nares patent, no discharge or erythema, pharynx normal Oral cavity: MMM, no lesions Neck: supple, no lymphadenopathy, no thyromegaly, no masses Heart: RRR, normal S1, S2, no murmurs Lungs: CTA bilaterally, no wheezes, rhonchi, or rales Abdomen: +bs, soft, + mild epigatric tenderness no rebound, non distended, no masses, no hepatomegaly, no splenomegaly Musculoskeletal: nontender, no swelling, no obvious deformity Extremities: no edema, no cyanosis, no clubbing Pulses: 2+ symmetric, upper and lower extremities, normal cap refill Neurological: alert, oriented x 3, CN2-12 intact, strength normal upper extremities and lower extremities, sensation normal throughout, DTRs 2+ throughout, no cerebellar signs, gait normal Psychiatric: normal affect, tearful when talking about anxiety.  Breast: defer Gyn: defer Rectal: defer   Medicare Attestation I have personally  reviewed: The patient's medical and social history Their use of alcohol, tobacco or illicit drugs Their current medications and supplements The patient's functional ability including ADLs,fall risks, home safety risks, cognitive, and hearing and visual impairment Diet and physical activities Evidence for depression or mood disorders  The patient's weight, height, BMI, and visual acuity have been recorded in the chart.  I have made referrals, counseling, and provided education to the patient based on review of the above and I have provided the patient with a written personalized care plan for preventive services.     Vicie Mutters, PA-C   07/16/2015

## 2015-07-17 LAB — VITAMIN D 25 HYDROXY (VIT D DEFICIENCY, FRACTURES): VIT D 25 HYDROXY: 46 ng/mL (ref 30–100)

## 2015-07-18 ENCOUNTER — Ambulatory Visit (INDEPENDENT_AMBULATORY_CARE_PROVIDER_SITE_OTHER): Payer: PPO | Admitting: Gastroenterology

## 2015-07-18 ENCOUNTER — Encounter: Payer: Self-pay | Admitting: Gastroenterology

## 2015-07-18 VITALS — BP 120/62 | HR 80 | Ht 62.0 in | Wt 103.2 lb

## 2015-07-18 DIAGNOSIS — K219 Gastro-esophageal reflux disease without esophagitis: Secondary | ICD-10-CM

## 2015-07-18 DIAGNOSIS — F458 Other somatoform disorders: Secondary | ICD-10-CM

## 2015-07-18 DIAGNOSIS — R1013 Epigastric pain: Secondary | ICD-10-CM

## 2015-07-18 DIAGNOSIS — R0989 Other specified symptoms and signs involving the circulatory and respiratory systems: Secondary | ICD-10-CM

## 2015-07-18 NOTE — Patient Instructions (Signed)
You have been scheduled for an Upper GI Series and Small Bowel Follow Thru at [facility] on the first floor.  Your appointment is on  Friday 07-25-2015 at 9:30 am. Please arrive at 9:15 am prior to your test for registration. Make certain not to have anything to eat or drink after midnight on the night before your test. If you need to reschedule, please contact radiology at [phone]. --------------------------------------------------------------------------------------------------------------- An upper GI series uses x rays to help diagnose problems of the upper GI tract, which includes the esophagus, stomach, and duodenum. The duodenum is the first part of the small intestine. An upper GI series is conducted by a radiology technologist or a radiologist-a doctor who specializes in x-ray imaging-at a hospital or outpatient center. While sitting or standing in front of an x-ray machine, the patient drinks barium liquid, which is often white and has a chalky consistency and taste. The barium liquid coats the lining of the upper GI tract and makes signs of disease show up more clearly on x rays. X-ray video, called fluoroscopy, is used to view the barium liquid moving through the esophagus, stomach, and duodenum. Additional x rays and fluoroscopy are performed while the patient lies on an x-ray table. To fully coat the upper GI tract with barium liquid, the technologist or radiologist may press on the abdomen or ask the patient to change position. Patients hold still in various positions, allowing the technologist or radiologist to take x rays of the upper GI tract at different angles. If a technologist conducts the upper GI series, a radiologist will later examine the images to look for problems.  This test typically takes about 1 hour to  complete ---------------------------------------------------------------------------------------------------------------------------------------------

## 2015-07-23 ENCOUNTER — Other Ambulatory Visit: Payer: Self-pay | Admitting: Gastroenterology

## 2015-07-23 ENCOUNTER — Ambulatory Visit (HOSPITAL_COMMUNITY)
Admission: RE | Admit: 2015-07-23 | Discharge: 2015-07-23 | Disposition: A | Discharge status: Home / Self Care | Payer: PPO | Source: Ambulatory Visit | Attending: Gastroenterology | Admitting: Gastroenterology

## 2015-07-23 ENCOUNTER — Encounter: Payer: Self-pay | Admitting: Gastroenterology

## 2015-07-23 DIAGNOSIS — R0989 Other specified symptoms and signs involving the circulatory and respiratory systems: Secondary | ICD-10-CM | POA: Insufficient documentation

## 2015-07-23 DIAGNOSIS — K224 Dyskinesia of esophagus: Secondary | ICD-10-CM | POA: Diagnosis not present

## 2015-07-23 DIAGNOSIS — R1013 Epigastric pain: Secondary | ICD-10-CM | POA: Diagnosis not present

## 2015-07-23 DIAGNOSIS — K219 Gastro-esophageal reflux disease without esophagitis: Secondary | ICD-10-CM | POA: Insufficient documentation

## 2015-07-23 DIAGNOSIS — K449 Diaphragmatic hernia without obstruction or gangrene: Secondary | ICD-10-CM | POA: Diagnosis not present

## 2015-07-23 NOTE — Progress Notes (Signed)
Agree with assessment and plans. This patient is very pleasant but exceptionally functional. Thank you for seeing her

## 2015-07-23 NOTE — Progress Notes (Signed)
     07/18/2015 PREZLEY OFFUTT [id number] [id number]   History of Present Illness: This is a 70 year old female who is known to Dr. Henrene Pastor for treatment of GERD and constipation. She presents to our office today with complaints of difficulty swallowing, reflux, and left upper quadrant abdominal pain. She has already been scheduled for an esophagram by her PCP.  Notes from her PCP are questioning esophageal spasm vs anxiety. She is also complaining of palpitations and the feeling that her heart is racing in association with some of these GI complaints as well. She says that extensive cardiac evaluation within the past year and a half was unremarkable and her PCP does not think this is cardiac related. She says that her symptoms seem to be worse when lying down at night or when bending over to work outside in the yard.  No food actually getting stuck, but feels like a "lump" is in her esophagus.  Asking if this could be her hiatal hernia.  Symptoms kept her from sleep all night on one occasion.  CBC, BMP, hepatic function panel, and TSH WNL's.  She is on pantoprazole 40 mg daily and Carafate 3 times a day with meals as needed.  EGD with Dr. Henrene Pastor in April 2015 was normal.   Current Medications, Allergies, Past Medical History, Past Surgical History, Family History and Social History were reviewed in [employer] record.   Physical Exam: BP 120/62 mmHg  Pulse 80  Ht 5\' 2"  (1.575 m)  Wt 103 lb 4 oz (46.834 kg)  BMI 18.88 kg/m2 General: Well developed white female in no acute distress; slightly anxious appearing. Head: Normocephalic and atraumatic Eyes:  Sclerae anicteric, conjunctiva pink  Ears: Normal auditory acuity Lungs: Clear throughout to auscultation Heart: Regular rate and rhythm Abdomen: Soft, non-distended.  Normal bowel sounds.  Mild LUQ TTP. Musculoskeletal: Symmetrical with no gross deformities  Extremities: No edema  Neurological: Alert oriented x 4,  grossly non-focal Psychological:  Alert and cooperative. Normal mood and affect  Assessment and Recommendations: -70 year old female with complaints of globus sensation, LUQ abdominal pain with associated palpitations:  ? GERD vs esophageal spasm vs anxiety related.  No concern for cardiac issues per PCP.  Already scheduled for an esophagram, but will do UGI to evaluate stomach as well since she is having this LUQ abdominal pain.  Continue daily PPI and carafate for now.

## 2015-07-24 ENCOUNTER — Encounter: Payer: Self-pay | Admitting: Physician Assistant

## 2015-07-24 ENCOUNTER — Other Ambulatory Visit: Payer: Self-pay | Admitting: *Deleted

## 2015-07-24 ENCOUNTER — Ambulatory Visit (INDEPENDENT_AMBULATORY_CARE_PROVIDER_SITE_OTHER): Payer: PPO | Admitting: Physician Assistant

## 2015-07-24 VITALS — BP 110/80 | HR 95 | Temp 97.3°F | Resp 16 | Ht 62.0 in | Wt 105.4 lb

## 2015-07-24 DIAGNOSIS — F411 Generalized anxiety disorder: Secondary | ICD-10-CM

## 2015-07-24 DIAGNOSIS — R1310 Dysphagia, unspecified: Secondary | ICD-10-CM | POA: Diagnosis not present

## 2015-07-24 DIAGNOSIS — R002 Palpitations: Secondary | ICD-10-CM

## 2015-07-24 DIAGNOSIS — R1013 Epigastric pain: Secondary | ICD-10-CM

## 2015-07-24 NOTE — Patient Instructions (Addendum)
IF WORSENING HEART BEATS CALL DR. MCCLEAN AND REPEAT HOLTER MONITOR BUT HAS HAD NORMAL CATH AND NORMAL STRESS TEST   Your A1C is a measure of your sugar over the past 3 months and is not affected by what you have eaten over the past few days. Diabetes increases your chances of stroke and heart attack over 300 % and is the leading cause of blindness and kidney failure in the [place]. Please make sure you decrease bad carbs like white bread, white rice, potatoes, corn, soft drinks, pasta, cereals, refined sugars, sweet tea, dried fruits, and fruit juice. Good carbs are okay to eat in moderation like sweet potatoes, brown rice, whole grain pasta/bread, most fruit (except dried fruit) and you can eat as many veggies as you want.   Greater than 6.5 is considered diabetic. Between 6.4 and 5.7 is prediabetic If your A1C is less than 5.7 you are NOT diabetic.  Targets for Glucose Readings: Time of Check Target for patients WITHOUT Diabetes Target for DIABETICS  Before Meals Less than 100  less than 150  Two hours after meals Less than 200  Less than 250   HOME CARE INSTRUCTIONS   Do not stand or sit in one position for long periods of time. Do not sit with your legs crossed. Rest with your legs raised during the day.  Your legs have to be higher than your heart so that gravity will force the valves to open, so please really elevate your legs.   Wear elastic stockings or support hose. Do not wear other tight, encircling garments around the legs, pelvis, or waist.  [employer]  has a wide variety of well priced compression stockings. [place], [place] [address] [phone]  Walk as much as possible to increase blood flow.  Raise the foot of your bed at night with 2-inch blocks. SEEK MEDICAL CARE IF:   The skin around your ankle starts to break down.  You have pain, redness, tenderness, or hard swelling developing in your leg over a vein.  You are uncomfortable due to  leg pain. Document Released: 12/16/2004 Document Revised: 05/31/2011 Document Reviewed: 05/04/2010 [facility] Patient Information 2014 [place].  Premature Ventricular Contraction A premature ventricular contraction is an irregularity in the normal heart rhythm. These contractions are extra heartbeats that occur too early in the normal sequence. In most cases, these contractions are harmless and do not require treatment. CAUSES Premature ventricular contractions may occur without a known cause. In healthy people, the extra contractions may be caused by:  Smoking.  Drinking alcohol.  Caffeine.  Certain medicines.  Some illegal drugs.  Stress. Sometimes, changes in chemicals in the blood (electrolytes) can also cause premature ventricular contractions. They can also occur in people with heart diseases that cause a decrease in blood flow to the heart. SIGNS AND SYMPTOMS Premature ventricular contractions often do not cause any symptoms. In some cases, you may have a feeling of your heart beating fast or skipping a beat (palpitations). DIAGNOSIS Your health care provider will take your medical history and do a physical exam. During the exam, the health care provider will check for irregular heartbeats. Various tests may be done to help diagnose premature ventricular contractions. These tests may include:  An ECG (electrocardiogram) to monitor the electrical activity of your heart.  Holter monitor testing. A Holter monitor is a portable device that can monitor the electrical activity of your heart over longer periods of time.  Stress tests to  see how exercise affects your heart rhythm.  Echocardiogram. This test uses sound waves (ultrasound) to produce an image of your heart.  Electrophysiology study. This is used to evaluate the electrical conduction system of your heart. TREATMENT Usually, no treatment is needed. You may be advised to avoid things that can trigger the premature  contractions, such as caffeine or alcohol. Medicines are sometimes given if symptoms are severe or if the extra heartbeats are very frequent. Treatment may also be needed for an underlying cause of the contractions if one is found. HOME CARE INSTRUCTIONS  Take medicines only as directed by your health care provider.  Make any lifestyle changes recommended by your health care provider. These may include:  Quitting smoking.  Avoiding or limiting caffeine or alcohol.  Exercising. Talk to your health care provider about what type of exercise is safe for you.  Trying to reduce stress.  Keep all follow-up visits with your health care provider. This is important. SEEK IMMEDIATE MEDICAL CARE IF:  You feel palpitations that are frequent or continual.  You have chest pain.  You have shortness of breath.  You have sweating for no reason.  You have nausea and vomiting.  You become light-headed or faint.   This information is not intended to replace advice given to you by your health care provider. Make sure you discuss any questions you have with your health care provider.   Document Released: 10/24/2003 Document Revised: 03/29/2014 Document Reviewed: 08/09/2013 Elsevier Interactive Patient Education [employer].

## 2015-07-24 NOTE — Progress Notes (Signed)
   Subjective:    Patient ID: Andrea Bates, female    DOB: 30-Jul-1945, 70 y.o.   MRN: [id number]  HPI 70 y.o. WF with anxiety presents with left knee pain. She states that she has had a "pulsitile" sensation in posterior knee intermittent x 1 month. She has also noticed more prominent veins decreased bilaterally. No redness, swelling, warmth, fever, chills, pain with walking, no injury.   Blood pressure 110/80, pulse 95, temperature 97.3 F (36.3 C), temperature source Temporal, resp. rate 16, height 5\' 2"  (1.575 m), weight 105 lb 6.4 oz (47.809 kg), SpO2 98 %.   Review of Systems  Constitutional: Negative.   HENT: Negative.   Respiratory: Negative.   Cardiovascular: Positive for palpitations. Negative for chest pain and leg swelling.  Gastrointestinal: Negative.   Genitourinary: Negative.   Musculoskeletal: Positive for arthralgias. Negative for myalgias, back pain, joint swelling, gait problem, neck pain and neck stiffness.  Skin: Negative.   Neurological: Negative.   Psychiatric/Behavioral: The patient is nervous/anxious.        Objective:   Physical Exam  Constitutional: She is oriented to person, place, and time. She appears well-developed and well-nourished.  HENT:  Head: Normocephalic and atraumatic.  Right Ear: External ear normal.  Left Ear: External ear normal.  Mouth/Throat: Oropharynx is clear and moist.  Eyes: Conjunctivae and EOM are normal. Pupils are equal, round, and reactive to light.  Neck: Normal range of motion. Neck supple. No thyromegaly present.  Cardiovascular: Normal rate, regular rhythm, S1 normal, S2 normal, normal heart sounds, intact distal pulses and normal pulses.   Occasional extrasystoles are present. PMI is not displaced.  Exam reveals no gallop and no friction rub.   No murmur heard. Pulmonary/Chest: Effort normal and breath sounds normal. No respiratory distress. She has no wheezes.  Abdominal: Soft. Bowel sounds are normal. She exhibits  no distension, no abdominal bruit, no pulsatile midline mass and no mass. There is no tenderness. There is no rebound and no guarding.  Musculoskeletal: Normal range of motion.  Bilateral legs with varicose veins, no cords, no warmth, redness, swelling. Good distal neurovascular exam. Good cap refill. Knee with FROM no pain, no effusion, crepitus, no ligament laxity/abnormalitiy.   Lymphadenopathy:    She has no cervical adenopathy.  Neurological: She is alert and oriented to person, place, and time. She displays normal reflexes. No cranial nerve deficit. Coordination normal.  Skin: Skin is warm and dry.  Psychiatric: She has a normal mood and affect.      Assessment & Plan:  Left knee pain Likely due to PVC/PACs, normal exam, offered ABI for anxiety but patient declines, will follow up with Dr. Aundra Dubin if continues. Patient reassured.

## 2015-07-25 ENCOUNTER — Ambulatory Visit (HOSPITAL_COMMUNITY)
Admission: RE | Admit: 2015-07-25 | Discharge: 2015-07-25 | Disposition: A | Discharge status: Home / Self Care | Payer: PPO | Source: Ambulatory Visit | Attending: Gastroenterology | Admitting: Gastroenterology

## 2015-07-25 ENCOUNTER — Ambulatory Visit (HOSPITAL_COMMUNITY): Payer: PPO

## 2015-07-25 ENCOUNTER — Telehealth: Payer: Self-pay | Admitting: Cardiology

## 2015-07-25 ENCOUNTER — Other Ambulatory Visit: Payer: Self-pay | Admitting: Gastroenterology

## 2015-07-25 DIAGNOSIS — R1013 Epigastric pain: Secondary | ICD-10-CM | POA: Diagnosis not present

## 2015-07-25 DIAGNOSIS — R002 Palpitations: Secondary | ICD-10-CM

## 2015-07-25 DIAGNOSIS — R109 Unspecified abdominal pain: Secondary | ICD-10-CM | POA: Diagnosis not present

## 2015-07-25 NOTE — Telephone Encounter (Signed)
New message   Pt wants to speak to RN she want Dr.McLean to set it up where she can wear a Monitor

## 2015-07-25 NOTE — Telephone Encounter (Signed)
Pt states in the last 3-4 weeks she has had more frequent episodes of heart skipping. Pt states palpitations can last  3-4 hours at a time, seems to be different than palpitations she has had in the past.  Pt denies increase in lightheadedness or dizziness.  Pt advised I will forward to Dr Aundra Dubin for review.

## 2015-07-25 NOTE — Telephone Encounter (Signed)
She can wear a 48 hr monitor.

## 2015-07-28 ENCOUNTER — Ambulatory Visit (HOSPITAL_COMMUNITY)
Admission: RE | Admit: 2015-07-28 | Discharge: 2015-07-28 | Disposition: A | Discharge status: Home / Self Care | Payer: PPO | Source: Ambulatory Visit | Attending: Gastroenterology | Admitting: Gastroenterology

## 2015-07-28 DIAGNOSIS — R1013 Epigastric pain: Secondary | ICD-10-CM | POA: Diagnosis not present

## 2015-07-28 DIAGNOSIS — R1010 Upper abdominal pain, unspecified: Secondary | ICD-10-CM | POA: Diagnosis not present

## 2015-07-28 NOTE — Telephone Encounter (Signed)
Pt notified Dr Aundra Dubin recommends 48 hour holter monitor, will forward message to [facility] to contact pt to schedule monitor.

## 2015-07-31 ENCOUNTER — Encounter: Payer: Self-pay | Admitting: Physician Assistant

## 2015-08-06 ENCOUNTER — Telehealth: Payer: Self-pay | Admitting: Internal Medicine

## 2015-08-06 ENCOUNTER — Ambulatory Visit (INDEPENDENT_AMBULATORY_CARE_PROVIDER_SITE_OTHER): Payer: PPO

## 2015-08-06 DIAGNOSIS — R002 Palpitations: Secondary | ICD-10-CM

## 2015-08-06 NOTE — Telephone Encounter (Signed)
Pt aware and knows to keep scheduled OV. 

## 2015-08-06 NOTE — Telephone Encounter (Signed)
Unfortunately, Andrea Bates has chronic functional complaints. She has had little evaluations multiple procedures for abdominal pain without organic cause found. She can follow-up with me in my clinic to discuss further. Routine follow-up

## 2015-08-06 NOTE — Telephone Encounter (Signed)
Pt has ov with Dr. Henrene Pastor 08/19/15. States she continues to have epigastric pain, pain in her stomach, and pain that goes from side to side across her abdomen. States she woke up last night with bad esophageal spasms. Pt wants to know if any tests or procedures can be ordered or done prior to the OV to see what is wrong. Please advise. Pt saw Alonza Bogus PA 07/18/15.

## 2015-08-11 ENCOUNTER — Telehealth: Payer: Self-pay | Admitting: Cardiology

## 2015-08-11 ENCOUNTER — Ambulatory Visit (INDEPENDENT_AMBULATORY_CARE_PROVIDER_SITE_OTHER): Payer: PPO | Admitting: Internal Medicine

## 2015-08-11 ENCOUNTER — Encounter: Payer: Self-pay | Admitting: Internal Medicine

## 2015-08-11 VITALS — BP 124/78 | HR 72 | Temp 98.2°F | Resp 18 | Ht 62.0 in | Wt 99.0 lb

## 2015-08-11 DIAGNOSIS — K5900 Constipation, unspecified: Secondary | ICD-10-CM

## 2015-08-11 DIAGNOSIS — R1013 Epigastric pain: Secondary | ICD-10-CM | POA: Diagnosis not present

## 2015-08-11 DIAGNOSIS — K219 Gastro-esophageal reflux disease without esophagitis: Secondary | ICD-10-CM

## 2015-08-11 MED ORDER — TRAZODONE HCL 100 MG PO TABS: 100.0000 mg | ORAL_TABLET | Freq: Every day | ORAL | Status: DC

## 2015-08-11 MED ORDER — POLYETHYLENE GLYCOL 3350 17 G PO PACK: 17.0000 g | PACK | Freq: Every day | ORAL | Status: DC

## 2015-08-11 MED ORDER — RANITIDINE HCL 300 MG PO TABS: 300.0000 mg | ORAL_TABLET | Freq: Every day | ORAL | Status: DC

## 2015-08-11 NOTE — Progress Notes (Signed)
   Subjective:    Patient ID: Andrea Bates, female    DOB: 1945-12-14, 70 y.o.   MRN: [id number]  Abdominal Pain Pertinent negatives include no constipation, diarrhea, fever, nausea or vomiting.   Patient presents to the office for evaluation of indigestion and epigastric pain which radiates to her bilateral sides and also up to her breast bone.  She reports that she is having some esophageal spasms seen on an barium swallow.  She reports that the pressure causes her heart to feel like it is acting up.  She reports that she is taking the protonix and is also doing mylanta and also some water.  She reports that she was belching a lot.  She reports that she is due to see Dr. Henrene Pastor next Tuesday.  She reports that she is due to be out of town and she is afraid that this is going to get worsening palpitations.      Review of Systems  Constitutional: Negative for fever, chills and fatigue.  Respiratory: Positive for chest tightness and shortness of breath.   Cardiovascular: Negative for chest pain and palpitations.  Gastrointestinal: Positive for abdominal pain and abdominal distention. Negative for nausea, vomiting, diarrhea, constipation and blood in stool.       Objective:   Physical Exam  Constitutional: She is oriented to person, place, and time. She appears well-developed and well-nourished. No distress.  HENT:  Head: Normocephalic.  Mouth/Throat: Oropharynx is clear and moist. No oropharyngeal exudate.  Eyes: Conjunctivae are normal. No scleral icterus.  Neck: Normal range of motion. Neck supple. No JVD present. No thyromegaly present.  Cardiovascular: Normal rate, regular rhythm, normal heart sounds and intact distal pulses.  Exam reveals no gallop and no friction rub.   No murmur heard. Pulmonary/Chest: Effort normal and breath sounds normal. No respiratory distress. She has no wheezes. She has no rales. She exhibits no tenderness.  Abdominal: Soft. Bowel sounds are normal. She  exhibits no distension and no mass. There is no tenderness. There is no rebound and no guarding.  Musculoskeletal: Normal range of motion.  Lymphadenopathy:    She has no cervical adenopathy.  Neurological: She is alert and oriented to person, place, and time.  Skin: Skin is warm and dry. She is not diaphoretic.  Psychiatric: She has a normal mood and affect. Her behavior is normal. Judgment and thought content normal.  Nursing note and vitals reviewed.   Filed Vitals:   08/11/15 1620  BP: 124/78  Pulse: 72  Temp: 98.2 F (36.8 C)  Resp: 18         Assessment & Plan:    1. Gastroesophageal reflux disease without esophagitis -cont protonix -cont elevating head of the bed - traZODone (DESYREL) 100 MG tablet; Take 1 tablet (100 mg total) by mouth at bedtime.  Dispense: 90 tablet; Refill: 0 - ranitidine (ZANTAC) 300 MG tablet; Take 1 tablet (300 mg total) by mouth at bedtime.  Dispense: 60 tablet; Refill: 1 -patient adament about having abdominal ultrasound.  Will order, but have expressed that gallbladder on previous imaging has been very normal  2. Constipation, unspecified constipation type  - polyethylene glycol (MIRALAX / GLYCOLAX) packet; Take 17 g by mouth daily.  Dispense: 100 each; Refill: 0

## 2015-08-11 NOTE — Telephone Encounter (Signed)
Per pt call:   Pt would like a call back about her Monitor test please.

## 2015-08-11 NOTE — Telephone Encounter (Signed)
Per Shelly--monitor report not finalized for review yet. Pt advised report not finalized, I will call her back once monitor report has been reviewed by Dr Aundra Dubin.

## 2015-08-11 NOTE — Patient Instructions (Signed)
Please take zantac (ranitidine) 300 mg twice daily.    Please take trazodone 100 mg nightly 30 minutes prior to bedtime.    Please take miralax 1 packet or capful daily with any type of liquid.    Please eat small meals frequently.

## 2015-08-14 MED ORDER — DILTIAZEM HCL 30 MG PO TABS: ORAL_TABLET | ORAL | Status: DC

## 2015-08-14 NOTE — Telephone Encounter (Signed)
New message  Patient c/o Palpitations:  High priority if patient c/o lightheadedness and shortness of breath.  1. How long have you been having palpitations? Gotten worse within the last 4-5 weeks   2. Are you currently experiencing lightheadedness and shortness of breath? Light headed every once in a while.   3. Have you checked your BP and heart rate? (document readings) Normally it runs on the average 110-115/68-62. Lately it has jumped to 130-135/60-80  4. Are you experiencing any other symptoms? Spasms in her throat( A choking feeling)    Comments: pt wore the monitor the week prior and haven't heard anything. Appt scheduled with Marita Snellen on 08/28/2015 at 11a

## 2015-08-14 NOTE — Telephone Encounter (Signed)
Dr Aundra Dubin reviewed 48 hour monitor---no worrisome abnormalities.  I spoke with patient and discussed monitor report with her.  Pt states that she has had more frequent palpitations in the last few days, pt denies palpitations at this time. Pt states she is getting ready to go on a family vacation. Pt is requesting a recommendation for a medication she could take as needed when she has frequent palpitations. Pt states she thinks beta blockers cause problems with her breathing and diltiazem caused a significant drop in her BP.  Pt advised I will forward to Dr Aundra Dubin for review.

## 2015-08-14 NOTE — Telephone Encounter (Signed)
To Dr Aundra Dubin for review.  Dr Aundra Dubin -the 48 hour holter monitor report is complete now-it is in your In Basket under Studies to Read tab.

## 2015-08-14 NOTE — Telephone Encounter (Signed)
Dr Coolidge Breeze will try diltiazem 30mg  prn-what is the frequency?

## 2015-08-14 NOTE — Telephone Encounter (Signed)
Really the only 2 good options.  Could try low dose diltiazem, 30 mg po prn.

## 2015-08-19 ENCOUNTER — Ambulatory Visit (INDEPENDENT_AMBULATORY_CARE_PROVIDER_SITE_OTHER): Payer: PPO | Admitting: Internal Medicine

## 2015-08-19 ENCOUNTER — Encounter: Payer: Self-pay | Admitting: Internal Medicine

## 2015-08-19 ENCOUNTER — Other Ambulatory Visit (INDEPENDENT_AMBULATORY_CARE_PROVIDER_SITE_OTHER): Payer: PPO

## 2015-08-19 VITALS — BP 100/60 | HR 82 | Ht 62.0 in | Wt 101.0 lb

## 2015-08-19 DIAGNOSIS — R1013 Epigastric pain: Secondary | ICD-10-CM | POA: Diagnosis not present

## 2015-08-19 DIAGNOSIS — F458 Other somatoform disorders: Secondary | ICD-10-CM

## 2015-08-19 DIAGNOSIS — R1084 Generalized abdominal pain: Secondary | ICD-10-CM

## 2015-08-19 DIAGNOSIS — K219 Gastro-esophageal reflux disease without esophagitis: Secondary | ICD-10-CM | POA: Diagnosis not present

## 2015-08-19 DIAGNOSIS — R0989 Other specified symptoms and signs involving the circulatory and respiratory systems: Secondary | ICD-10-CM

## 2015-08-19 DIAGNOSIS — K589 Irritable bowel syndrome without diarrhea: Secondary | ICD-10-CM

## 2015-08-19 DIAGNOSIS — R09A2 Foreign body sensation, throat: Secondary | ICD-10-CM

## 2015-08-19 LAB — IGA: IgA: 137 mg/dL (ref 68–378)

## 2015-08-19 MED ORDER — PANTOPRAZOLE SODIUM 40 MG PO TBEC: 40.0000 mg | DELAYED_RELEASE_TABLET | Freq: Every day | ORAL | Status: DC

## 2015-08-19 NOTE — Progress Notes (Signed)
HISTORY OF PRESENT ILLNESS:  Andrea Bates is a 70 y.o. female with past medical history as listed below and a history of chronic somatic complaints who presents today with multiple complaints. She is accompanied by her husband [name]. She has been evaluated on multiple occasions for multiple chronic complaints. Seen recently by her physician assistant. Continues with similar complaints including epigastric discomfort relieved with belching, increased intestinal gas, intermittent left upper quadrant pain, globus sensation, irregular bowel habits, palpitations. She goes through a list of complaints. She cries during the interview. She pleads for investigations such as colonoscopy and upper endoscopy. Recent cardiac workup was negative. Her PCP fill her issues were related to anxiety and started Zoloft. She took this infrequently not feeling that her problems were anxiety related or functional. She tells me that she has acid reflux despite ranitidine. Recent extensive blood work has been unremarkable. Her weight has been stable. Upper GI series May 2017 was normal. Her last colonoscopy and upper endoscopy were in April 2015 with minimal findings. Recent esophagram unremarkable. Previous ultrasound and gastric emptying studies negative. She requests that we "find her problem and fix it" before she goes on a cruise with her family  REVIEW OF SYSTEMS:  All non-GI ROS negative except for sinus allergy, palpitations, insomnia  Past Medical History  Diagnosis Date  . Diverticulosis   . Hiatal hernia   . Hypothyroidism   . Personal history of colonic polyps     a. colonoscopy 2009  . Personal history of malignant neoplasm of breast   . Hemorrhoids   . Hyperlipidemia   . Palpitations     a. 04/2008 echo: EF 50-60%;  b. 21 day event monitor 2012 - occasional sinus tachycardia with rates 110-120's. Rare PAC's/PVC's. Serveral short 4-5 beat runs of a-tach.  . Irritable bowel syndrome   . Hyperlipidemia     Is now taking Fish oil. Had myalgias with Vytorin and Lipitor  . GERD (gastroesophageal reflux disease)     a. mild esophagitis on egd 2012  . Pre-diabetes   . Chest pain     a. negative myoview 2011 [name]);  b. 04/2010 Cath: normal cors  . Colon polyps     Past Surgical History  Procedure Laterality Date  . Nasal sinus surgery    . Tonsillectomy    . Carpal tunnel release      bilateral   . Colonoscopy    . Bunionectomy      Social History TYECHIA DELCASTILLO  reports that she has never smoked. She has never used smokeless tobacco. She reports that she does not drink alcohol or use illicit drugs.  family history includes Diabetes in her paternal grandmother; Heart defect in her father.  Allergies  Allergen Reactions  . Armour Thyroid [Thyroid]     Palpitations   . Atenolol     presyncope  . Celexa [Citalopram Hydrobromide]     Severe chest tightness   . Diltiazem Hcl     Dropped bp  . Levaquin [Levofloxacin In D5w] Nausea And Vomiting  . Lipitor [Atorvastatin]     Myalgias   . Propranolol Hcl     Felt bad  . Vytorin [Ezetimibe-Simvastatin]     Myalgias        PHYSICAL EXAMINATION: Vital signs: BP 100/60 mmHg  Pulse 82  Ht 5\' 2"  (1.575 m)  Wt 101 lb (45.813 kg)  BMI 18.47 kg/m2  Constitutional: generally well-appearing, no acute distress Psychiatric: alert and oriented x3, cooperative. Anxious Eyes: extraocular  movements intact, anicteric, conjunctiva pink Mouth: oral pharynx moist, no lesions Neck: supple no lymphadenopathy Cardiovascular: heart regular rate and rhythm, no murmur Lungs: clear to auscultation bilaterally Abdomen: soft, nontender, nondistended, no obvious ascites, no peritoneal signs, normal bowel sounds, no organomegaly Rectal: Omitted Extremities: no clubbing cyanosis or lower extremity edema bilaterally Skin: no lesions on visible extremities Neuro: No focal deficits. Normal DTRs. Cranial nerves intact  ASSESSMENT:  #1.  Multiple GI and non-GI complaints felt to be functional #2. Increased intestinal gas #3. GERD by history #4. History of diminutive adenoma April 2015 #5. Health related anxiety   PLAN:  #1. Extensive discussion regarding functional complaints. Difficult to reach her or her husband on this issue #2. Screening for celiac disease with tissue transglutaminase antibody IgA and serum IgA level #3. Contrast-enhanced CT scan of the abdomen and pelvis to further evaluate pain, and if negative provide needed reassurance #4. Initiate pantoprazole 40 mg daily for complaints of GERD #5. If the above workup negative and no improvement on pantoprazole, would advise that she resume Zoloft therapy as prescribed by her PCP and follow up with her PCP thereafter for ongoing management of anxiety  40 minutes was spent face-to-face with the patient with greater than 50% of the time was use for counseling her regarding her chronic complaints and answering multiple questions from her and her husband.

## 2015-08-19 NOTE — Patient Instructions (Signed)
Your physician has requested that you go to the basement for the following lab work before leaving today: TTG, IGA  You have been scheduled for a CT scan of the abdomen and pelvis at [facility] ([address]---this is in the same building as Press [facility]).   You are scheduled on 08/20/2015  at 2:00pm. You should arrive 15 minutes prior to your appointment time for registration. Please follow the written instructions below on the day of your exam:  WARNING: IF YOU ARE ALLERGIC TO IODINE/X-RAY DYE, PLEASE NOTIFY RADIOLOGY IMMEDIATELY AT [phone]! YOU WILL BE GIVEN A 13 HOUR PREMEDICATION PREP.  1) Do not eat or drink anything after 10:00am (4 hours prior to your test) 2) You have been given 2 bottles of oral contrast to drink. The solution may taste better if refrigerated, but do NOT add ice or any other liquid to this solution. Shake well before drinking.    Drink 1 bottle of contrast @ 12:00pm (2 hours prior to your exam)  Drink 1 bottle of contrast @ 1:00pm (1 hour prior to your exam)  You may take any medications as prescribed with a small amount of water except for the following: Metformin, Glucophage, Glucovance, Avandamet, Riomet, Fortamet, Actoplus Met, Janumet, Glumetza or Metaglip. The above medications must be held the day of the exam AND 48 hours after the exam.  The purpose of you drinking the oral contrast is to aid in the visualization of your intestinal tract. The contrast solution may cause some diarrhea. Before your exam is started, you will be given a small amount of fluid to drink. Depending on your individual set of symptoms, you may also receive an intravenous injection of x-ray contrast/dye. Plan on being at [facility] for 30 minutes or long, depending on the type of exam you are having performed.  If you have any questions regarding your exam or if you need to reschedule, you may call the CT department at [phone] between the hours of 8:00  am and 5:00 pm, Monday-Friday.  ________________________________________________________________________   We have sent the following medications to your pharmacy for you to pick up at your convenience: Pantoprazole

## 2015-08-20 ENCOUNTER — Other Ambulatory Visit: Payer: PPO

## 2015-08-20 ENCOUNTER — Ambulatory Visit (INDEPENDENT_AMBULATORY_CARE_PROVIDER_SITE_OTHER)
Admission: RE | Admit: 2015-08-20 | Discharge: 2015-08-20 | Disposition: A | Discharge status: Home / Self Care | Payer: PPO | Source: Ambulatory Visit | Attending: Internal Medicine | Admitting: Internal Medicine

## 2015-08-20 DIAGNOSIS — K5900 Constipation, unspecified: Secondary | ICD-10-CM | POA: Diagnosis not present

## 2015-08-20 DIAGNOSIS — R1084 Generalized abdominal pain: Secondary | ICD-10-CM | POA: Diagnosis not present

## 2015-08-20 LAB — TISSUE TRANSGLUTAMINASE, IGA: TISSUE TRANSGLUTAMINASE AB, IGA: 1 U/mL (ref <4)

## 2015-08-20 MED ORDER — IOPAMIDOL (ISOVUE-300) INJECTION 61%
100.0000 mL | Freq: Once | INTRAVENOUS | Status: AC | PRN
  Administered 2015-08-20: 100 mL via INTRAVENOUS

## 2015-08-21 ENCOUNTER — Encounter: Payer: Self-pay | Admitting: Internal Medicine

## 2015-08-21 ENCOUNTER — Ambulatory Visit: Payer: Medicare Other | Admitting: Internal Medicine

## 2015-08-28 ENCOUNTER — Ambulatory Visit: Payer: PPO | Admitting: Physician Assistant

## 2015-09-03 ENCOUNTER — Ambulatory Visit (INDEPENDENT_AMBULATORY_CARE_PROVIDER_SITE_OTHER): Payer: PPO | Admitting: Physician Assistant

## 2015-09-03 ENCOUNTER — Encounter: Payer: Self-pay | Admitting: Physician Assistant

## 2015-09-03 VITALS — BP 124/76 | HR 72 | Temp 98.2°F | Resp 14 | Ht 62.0 in | Wt 100.0 lb

## 2015-09-03 DIAGNOSIS — K219 Gastro-esophageal reflux disease without esophagitis: Secondary | ICD-10-CM | POA: Diagnosis not present

## 2015-09-03 DIAGNOSIS — K5900 Constipation, unspecified: Secondary | ICD-10-CM | POA: Diagnosis not present

## 2015-09-03 MED ORDER — METOCLOPRAMIDE HCL 5 MG PO TABS: 5.0000 mg | ORAL_TABLET | Freq: Three times a day (TID) | ORAL | Status: DC | PRN

## 2015-09-03 MED ORDER — SUCRALFATE 1 G PO TABS: 1.0000 g | ORAL_TABLET | Freq: Four times a day (QID) | ORAL | Status: DC

## 2015-09-03 MED ORDER — ONDANSETRON HCL 4 MG PO TABS: 4.0000 mg | ORAL_TABLET | Freq: Every day | ORAL | Status: DC | PRN

## 2015-09-03 NOTE — Patient Instructions (Signed)
Do the nexium twice a day for a week or two Then go to once a day for a week Start on reglan once in the morning 36mins before food  Can take the carafate as needed before food Can take zofran as needed for nausea  Continue the zoloft Suggest seeing counselor   Gastroparesis Gastroparesis, also called delayed gastric emptying, is a condition in which food takes longer than normal to empty from the stomach. The condition is usually long-lasting (chronic). CAUSES This condition may be caused by:  An endocrine disorder, such as hypothyroidism or diabetes. Diabetes is the most common cause of this condition.  A nervous system disease, such as Parkinson disease or multiple sclerosis.  Cancer, infection, or surgery of the stomach or vagus nerve.  A connective tissue disorder, such as scleroderma.  Certain medicines. In most cases, the cause is not known. RISK FACTORS This condition is more likely to develop in:  People with certain disorders, including endocrine disorders, eating disorders, amyloidosis, and scleroderma.  People with certain diseases, including Parkinson disease or multiple sclerosis.  People with cancer or infection of the stomach or vagus nerve.  People who have had surgery on the stomach or vagus nerve.  People who take certain medicines.  Women. SYMPTOMS Symptoms of this condition include:  An early feeling of fullness when eating.  Nausea.  Weight loss.  Vomiting.  Heartburn.  Abdominal bloating.  Inconsistent blood glucose levels.  Lack of appetite.  Acid from the stomach coming up into the esophagus (gastroesophageal reflux).  Spasms of the stomach. Symptoms may come and go. DIAGNOSIS This condition is diagnosed with tests, such as:  Tests that check how long it takes food to move through the stomach and intestines. These tests include:  Upper gastrointestinal (GI) series. In this test, X-rays of the intestines are taken after you  drink a liquid. The liquid makes the intestines show up better on the X-rays.  Gastric emptying scintigraphy. In this test, scans are taken after you eat food that contains a small amount of radioactive material.  Wireless capsule GI monitoring system. This test involves swallowing a capsule that records information about movement through the stomach.  Gastric manometry. This test measures electrical and muscular activity in the stomach. It is done with a thin tube that is passed down the throat and into the stomach.  Endoscopy. This test checks for abnormalities in the lining of the stomach. It is done with a long, thin tube that is passed down the throat and into the stomach.  An ultrasound. This test can help rule out gallbladder disease or pancreatitis as a cause of your symptoms. It uses sound waves to take pictures of the inside of your body. TREATMENT There is no cure for gastroparesis. This condition may be managed with:  Treatment of the underlying condition causing the gastroparesis.  Lifestyle changes, including exercise and dietary changes. Dietary changes can include:  Changes in what and when you eat.  Eating smaller meals more often.  Eating low-fat foods.  Eating low-fiber forms of high-fiber foods, such as cooked vegetables instead of raw vegetables.  Having liquid foods in place of solid foods. Liquid foods are easier to digest.  Medicines. These may be given to control nausea and vomiting and to stimulate stomach muscles.  Getting food through a feeding tube. This may be done in severe cases.  A gastric neurostimulator. This is a device that is inserted into the body with surgery. It helps improve stomach emptying  and control nausea and vomiting. HOME CARE INSTRUCTIONS  Follow your health care provider's instructions about exercise and diet.  Take medicines only as directed by your health care provider. SEEK MEDICAL CARE IF:  Your symptoms do not improve  with treatment.  You have new symptoms. SEEK IMMEDIATE MEDICAL CARE IF:  You have severe abdominal pain that does not improve with treatment.  You have nausea that does not go away.  You cannot keep fluids down.   This information is not intended to replace advice given to you by your health care provider. Make sure you discuss any questions you have with your health care provider.   Document Released: 03/08/2005 Document Revised: 07/23/2014 Document Reviewed: 03/04/2014 Elsevier Interactive Patient Education [employer].

## 2015-09-03 NOTE — Progress Notes (Signed)
Subjective:    Patient ID: Andrea Bates, female    DOB: 1946-03-05, 70 y.o.   MRN: [id number]  HPI She is having pain and nausea after eating, decreased appetite.  Has had normal several normal labs/exams/tests.  She is on vitamin D mag and thyroid only. She is on zoloft 25mg  x 1 week, no AE's. She is off protonix and states that it makes her stomach pain worse. She has nausea, constipation, gets full quickly with decreased appetite.   Current Outpatient Prescriptions on File Prior to Visit  Medication Sig Dispense Refill  . Calcium Carbonate-Vitamin D (CALCIUM + D PO) Take 1 tablet by mouth daily.     . cholecalciferol (VITAMIN D) 1000 UNITS tablet Take 4,000 Units by mouth daily.     . Coenzyme Q10 (COQ10) 100 MG CAPS Take 100 mg by mouth 3 (three) times daily. Reported on 07/18/2015    . fish oil-omega-3 fatty acids 1000 MG capsule Take 2 g by mouth daily. Reported on 07/18/2015    . Flaxseed, Linseed, (FLAX SEED OIL) 1000 MG CAPS Take 1 capsule by mouth daily. Reported on 07/18/2015    . levothyroxine (SYNTHROID) 50 MCG tablet Take 1 tablet (50 mcg total) by mouth daily before breakfast. 30 tablet 2  . MAGNESIUM PO Take by mouth daily.    . mometasone (NASONEX) 50 MCG/ACT nasal spray Place 2 sprays into the nose daily.    . polyethylene glycol (MIRALAX / GLYCOLAX) packet Take 17 g by mouth daily. 100 each 0  . ranitidine (ZANTAC) 300 MG tablet Take 300 mg by mouth 2 (two) times daily.     No current facility-administered medications on file prior to visit.   Review of Systems  Constitutional: Negative for fever, chills and fatigue.  Respiratory: Negative for chest tightness and shortness of breath.   Cardiovascular: Negative for chest pain and palpitations.  Gastrointestinal: Positive for nausea, abdominal pain and abdominal distention. Negative for vomiting, diarrhea, constipation, blood in stool, anal bleeding and rectal pain.       Objective:   Physical Exam   Constitutional: She is oriented to person, place, and time. She appears well-developed and well-nourished. No distress.  HENT:  Head: Normocephalic.  Mouth/Throat: Oropharynx is clear and moist. No oropharyngeal exudate.  Eyes: Conjunctivae are normal. No scleral icterus.  Neck: Normal range of motion. Neck supple. No JVD present. No thyromegaly present.  Cardiovascular: Normal rate, regular rhythm, normal heart sounds and intact distal pulses.  Exam reveals no gallop and no friction rub.   No murmur heard. Pulmonary/Chest: Effort normal and breath sounds normal. No respiratory distress. She has no wheezes. She has no rales. She exhibits no tenderness.  Abdominal: Soft. Bowel sounds are normal. She exhibits no distension and no mass. There is no tenderness. There is no rebound and no guarding.  Musculoskeletal: Normal range of motion.  Lymphadenopathy:    She has no cervical adenopathy.  Neurological: She is alert and oriented to person, place, and time.  Skin: Skin is warm and dry. She is not diaphoretic.  Psychiatric: She has a normal mood and affect. Her behavior is normal. Judgment and thought content normal.  Nursing note and vitals reviewed.      Assessment & Plan:  GERD- Nexium BID, reglan 5mg  once a day, carafate and zofran as needed.  LONG discussion about how she has had normal labs/exam/tests and we will not do any further testing at this time.  Anxiety- she is on 25mg  zoloft continue  medication, long discussion about counseling/CBT, will consider it after vacation with family.  Instructed patient that if she does not follow our advise on starting on medications or counseling will dismiss from the practice.

## 2015-09-04 ENCOUNTER — Telehealth: Payer: Self-pay

## 2015-09-04 ENCOUNTER — Other Ambulatory Visit: Payer: Self-pay

## 2015-09-04 MED ORDER — ONDANSETRON HCL 4 MG PO TABS: 4.0000 mg | ORAL_TABLET | Freq: Every day | ORAL | Status: DC | PRN

## 2015-09-04 NOTE — Telephone Encounter (Signed)
Spoke w/ pt & she is willing to pay out of pocket for Zofran. Pt was giving a printed out Rx & a GOOD Rx card.

## 2015-09-15 ENCOUNTER — Ambulatory Visit: Payer: PPO | Admitting: Physician Assistant

## 2015-10-15 ENCOUNTER — Other Ambulatory Visit: Payer: Self-pay | Admitting: Internal Medicine

## 2015-10-16 ENCOUNTER — Ambulatory Visit: Payer: PPO | Admitting: Internal Medicine

## 2015-10-23 ENCOUNTER — Ambulatory Visit (INDEPENDENT_AMBULATORY_CARE_PROVIDER_SITE_OTHER): Payer: PPO | Admitting: Physician Assistant

## 2015-10-23 ENCOUNTER — Encounter: Payer: Self-pay | Admitting: Physician Assistant

## 2015-10-23 VITALS — BP 122/70 | HR 77 | Temp 97.3°F | Resp 14 | Ht 62.0 in | Wt 105.2 lb

## 2015-10-23 DIAGNOSIS — J320 Chronic maxillary sinusitis: Secondary | ICD-10-CM | POA: Diagnosis not present

## 2015-10-23 MED ORDER — AZITHROMYCIN 250 MG PO TABS: ORAL_TABLET | ORAL | 1 refills | Status: AC

## 2015-10-23 MED ORDER — BENZONATATE 100 MG PO CAPS: 200.0000 mg | ORAL_CAPSULE | Freq: Three times a day (TID) | ORAL | 0 refills | Status: DC | PRN

## 2015-10-23 NOTE — Progress Notes (Signed)
Subjective:    Patient ID: Andrea Bates, female    DOB: Jul 29, 1945, 70 y.o.   MRN: [id number]  HPI 70 y.o. WF presents with cough and SOB. She states her stomach is doing better, but since Sunday she has had some cough, sore throat, yellow productive mucus, headache, eyes watering, chills. On nasonex and claritin D on 1/2 pill daily.   Blood pressure 122/70, pulse 77, temperature 97.3 F (36.3 C), temperature source Temporal, resp. rate 14, height 5\' 2"  (1.575 m), weight 105 lb 3.2 oz (47.7 kg), SpO2 99 %.  Medications Current Outpatient Prescriptions on File Prior to Visit  Medication Sig  . Calcium Carbonate-Vitamin D (CALCIUM + D PO) Take 1 tablet by mouth daily.   . cholecalciferol (VITAMIN D) 1000 UNITS tablet Take 4,000 Units by mouth daily.   . Coenzyme Q10 (COQ10) 100 MG CAPS Take 100 mg by mouth 3 (three) times daily. Reported on 07/18/2015  . fish oil-omega-3 fatty acids 1000 MG capsule Take 2 g by mouth daily. Reported on 07/18/2015  . Flaxseed, Linseed, (FLAX SEED OIL) 1000 MG CAPS Take 1 capsule by mouth daily. Reported on 07/18/2015  . MAGNESIUM PO Take by mouth daily.  . metoCLOPramide (REGLAN) 5 MG tablet Take 1 tablet (5 mg total) by mouth every 8 (eight) hours as needed for nausea.  . mometasone (NASONEX) 50 MCG/ACT nasal spray Place 2 sprays into the nose daily.  . sertraline (ZOLOFT) 25 MG tablet Take 25 mg by mouth daily.  . sucralfate (CARAFATE) 1 g tablet Take 1 tablet (1 g total) by mouth 4 (four) times daily.   No current facility-administered medications on file prior to visit.     Problem list She has Hypothyroidism; Hyperlipidemia; Diverticulosis of large intestine; PERSONAL HX BREAST CANCER; History of colonic polyps; Syncope (VasoVagal Faint); Palpitations; VERTIGO; Dysphagia; Constipation; Fatigue; Vitamin D deficiency; Prediabetes; Medication management; Flushing; Encounter for Medicare annual wellness exam; Abdominal pain, epigastric; Gastroesophageal  reflux disease without esophagitis; and Globus sensation on her problem list.  Review of Systems  Constitutional: Positive for chills. Negative for fatigue and fever.  HENT: Positive for postnasal drip, sinus pressure and sore throat. Negative for congestion, dental problem, drooling, ear discharge, ear pain, facial swelling, hearing loss, mouth sores, nosebleeds, rhinorrhea, sneezing, tinnitus and trouble swallowing.   Eyes: Negative.  Negative for visual disturbance.  Respiratory: Positive for cough. Negative for apnea, choking, chest tightness, shortness of breath, wheezing and stridor.   Cardiovascular: Negative.   Genitourinary: Negative.   Neurological: Positive for headaches. Negative for dizziness, tremors, seizures, syncope, facial asymmetry, speech difficulty, weakness, light-headedness and numbness.       Objective:   Physical Exam  Constitutional: She appears well-developed and well-nourished.  HENT:  Head: Normocephalic and atraumatic.  Right Ear: External ear normal.  Nose: Right sinus exhibits maxillary sinus tenderness. Right sinus exhibits no frontal sinus tenderness. Left sinus exhibits maxillary sinus tenderness. Left sinus exhibits no frontal sinus tenderness.  Eyes: Conjunctivae and EOM are normal.  Neck: Normal range of motion. Neck supple.  Cardiovascular: Normal rate, regular rhythm, normal heart sounds and intact distal pulses.   Pulmonary/Chest: Effort normal and breath sounds normal. No respiratory distress. She has no wheezes.  Abdominal: Soft. Bowel sounds are normal.  Lymphadenopathy:    She has no cervical adenopathy.  Skin: Skin is warm and dry.       Assessment & Plan:  1. Left maxillary sinusitis Has some prednisone at home to try, cont claritin, hold  off on zpak x 2-3 days - azithromycin (ZITHROMAX) 250 MG tablet; Take 2 tablets (500 mg) on  Day 1,  followed by 1 tablet (250 mg) once daily on Days 2 through 5.  Dispense: 6 each; Refill: 1 -  benzonatate (TESSALON PERLES) 100 MG capsule; Take 2 capsules (200 mg total) by mouth 3 (three) times daily as needed for cough (Max: 600mg  per day).  Dispense: 60 capsule; Refill: 0

## 2015-10-23 NOTE — Patient Instructions (Signed)

## 2015-10-28 ENCOUNTER — Encounter: Payer: Self-pay | Admitting: Cardiology

## 2015-11-11 ENCOUNTER — Ambulatory Visit: Payer: PPO | Admitting: Cardiology

## 2015-11-18 DIAGNOSIS — H5203 Hypermetropia, bilateral: Secondary | ICD-10-CM | POA: Diagnosis not present

## 2015-11-18 DIAGNOSIS — H2513 Age-related nuclear cataract, bilateral: Secondary | ICD-10-CM | POA: Diagnosis not present

## 2015-12-01 ENCOUNTER — Ambulatory Visit: Payer: PPO | Admitting: Internal Medicine

## 2015-12-05 ENCOUNTER — Telehealth: Payer: Self-pay | Admitting: Cardiology

## 2015-12-05 DIAGNOSIS — R002 Palpitations: Secondary | ICD-10-CM

## 2015-12-05 NOTE — Telephone Encounter (Signed)
We can have her wear a monitor x 2 wks.  Hopefully that would catch her symptoms.

## 2015-12-05 NOTE — Telephone Encounter (Signed)
New message  Patient verbalized sob now when moving around    Pt c/o Shortness Of Breath: STAT if SOB developed within the last 24 hours or pt is noticeably SOB on the phone  1. Are you currently SOB (can you hear that pt is SOB on the phone)? No    2. How long have you been experiencing SOB? Couple of weeks   3. Are you SOB when sitting or when up moving around? Both   4. Are you currently experiencing any other symptoms? Increase heart rate.

## 2015-12-05 NOTE — Telephone Encounter (Signed)
Pt states for the last couple of weeks she has had more frequent/different palpitations than in the past.  Pt states this is associated with shortness of breath, tiredness and lightheadedness.

## 2015-12-05 NOTE — Telephone Encounter (Signed)
Pt states SBP when lightheaded has been 120-90/?. Pt states fastest heart rate has been 105. Pt states palpitations are not regular and smooth like in the past.  Pt states palpitations can vary in frequency and length, they resolve on their own.  Pt states she wore monitor several months ago but that was only for 24 hours and did not feel she had it long enough.  Pt is concerned because of change in palpitations associated with more shortness of breath and fatigue for the last 2-3 weeks.  Pt denies lower extremity edema, states weight is stable.  Pt advised I will forward to Dr Aundra Dubin for review.

## 2015-12-05 NOTE — Telephone Encounter (Signed)
Discussed with pt, pt agreed with 2 week monitor.

## 2015-12-16 ENCOUNTER — Encounter: Payer: Self-pay | Admitting: Cardiology

## 2016-01-19 ENCOUNTER — Other Ambulatory Visit: Payer: Self-pay | Admitting: Internal Medicine

## 2016-01-26 ENCOUNTER — Ambulatory Visit: Payer: PPO | Admitting: Cardiology

## 2016-02-09 ENCOUNTER — Ambulatory Visit: Payer: Self-pay | Admitting: Internal Medicine

## 2016-02-17 ENCOUNTER — Ambulatory Visit: Payer: PPO | Admitting: Physician Assistant

## 2016-02-20 DIAGNOSIS — M545 Low back pain: Secondary | ICD-10-CM | POA: Diagnosis not present

## 2016-04-26 NOTE — Progress Notes (Deleted)
HPI: FU palpitations; previously followed by Dr Aundra Dubin. Also with h/o hyperlipidemia, anxiety, and family history of early CAD. She underwent cardiac catheterization in 2/12 which showed normal coronaries and normal LV systolic function.  She has had a lot of problems with symptomatic PACs and PVCs in the past.  She has been intolerant of both beta blockers and calcium channel blockers.  She had a stress echo in 4/15 with 12' exercise, no evidence for ischemia or infarction. Echo 5/16 showed normal EF. Holter monitor May 2017 showed sinus with occasional PAC and PVC and short run of atrial tachycardia.  Current Outpatient Prescriptions  Medication Sig Dispense Refill  . benzonatate (TESSALON PERLES) 100 MG capsule Take 2 capsules (200 mg total) by mouth 3 (three) times daily as needed for cough (Max: 600mg  per day). 60 capsule 0  . Calcium Carbonate-Vitamin D (CALCIUM + D PO) Take 1 tablet by mouth daily.     . cholecalciferol (VITAMIN D) 1000 UNITS tablet Take 4,000 Units by mouth daily.     . Coenzyme Q10 (COQ10) 100 MG CAPS Take 100 mg by mouth 3 (three) times daily. Reported on 07/18/2015    . fish oil-omega-3 fatty acids 1000 MG capsule Take 2 g by mouth daily. Reported on 07/18/2015    . Flaxseed, Linseed, (FLAX SEED OIL) 1000 MG CAPS Take 1 capsule by mouth daily. Reported on 07/18/2015    . MAGNESIUM PO Take by mouth daily.    . metoCLOPramide (REGLAN) 5 MG tablet Take 1 tablet (5 mg total) by mouth every 8 (eight) hours as needed for nausea. 30 tablet 1  . mometasone (NASONEX) 50 MCG/ACT nasal spray Place 2 sprays into the nose daily.    . sertraline (ZOLOFT) 25 MG tablet Take 25 mg by mouth daily.  5  . sucralfate (CARAFATE) 1 g tablet Take 1 tablet (1 g total) by mouth 4 (four) times daily. 120 tablet 1  . SYNTHROID 50 MCG tablet TAKE 1 TABLET (50 MCG TOTAL) BY MOUTH DAILY BEFORE BREAKFAST. 90 tablet 1   No current facility-administered medications for this visit.      Past  Medical History:  Diagnosis Date  . Chest pain    a. negative myoview 2011 [name]);  b. 04/2010 Cath: normal cors  . Colon polyps   . Diverticulosis   . GERD (gastroesophageal reflux disease)    a. mild esophagitis on egd 2012  . Hemorrhoids   . Hiatal hernia   . Hyperlipidemia   . Hyperlipidemia    Is now taking Fish oil. Had myalgias with Vytorin and Lipitor  . Hypothyroidism   . Irritable bowel syndrome   . Palpitations    a. 04/2008 echo: EF 50-60%;  b. 21 day event monitor 2012 - occasional sinus tachycardia with rates 110-120's. Rare PAC's/PVC's. Serveral short 4-5 beat runs of a-tach.  . Personal history of colonic polyps    a. colonoscopy 2009  . Personal history of malignant neoplasm of breast   . Pre-diabetes     Past Surgical History:  Procedure Laterality Date  . BUNIONECTOMY    . CARPAL TUNNEL RELEASE     bilateral   . COLONOSCOPY    . NASAL SINUS SURGERY    . TONSILLECTOMY      Social History   Social History  . Marital status: Married    Spouse name: [name]  . Number of children: 2  . Years of education: N/A   Occupational History  . Retired  Textile   .  [place]   Social History Main Topics  . Smoking status: Never Smoker  . Smokeless tobacco: Never Used  . Alcohol use No  . Drug use: No  . Sexual activity: Not on file   Other Topics Concern  . Not on file   Social History Narrative  . No narrative on file    Family History  Problem Relation Age of Onset  . Colon cancer      Paternal Wiliam Ke   . Diabetes Paternal Grandmother   . Heart defect Father     ROS: no fevers or chills, productive cough, hemoptysis, dysphasia, odynophagia, melena, hematochezia, dysuria, hematuria, rash, seizure activity, orthopnea, PND, pedal edema, claudication. Remaining systems are negative.  Physical Exam: Well-developed well-nourished in no acute distress.  Skin is warm and dry.  HEENT is normal.  Neck is supple.  Chest is  clear to auscultation with normal expansion.  Cardiovascular exam is regular rate and rhythm.  Abdominal exam nontender or distended. No masses palpated. Extremities show no edema. neuro grossly intact  ECG

## 2016-04-30 ENCOUNTER — Ambulatory Visit: Payer: PPO | Admitting: Cardiology

## 2016-05-14 ENCOUNTER — Encounter: Payer: Self-pay | Admitting: Interventional Cardiology

## 2016-05-19 NOTE — Progress Notes (Signed)
HPI: Follow-up palpitations, hyperlipidemia and family history of early coronary disease. Previously followed by Dr. Aundra Dubin. Cardiac catheterization February 2012 showed normal coronary arteries and normal LV function. Stress echocardiogram April 2015 normal. Echocardiogram 5/16 showed normal LV function. Monitor May 2017 showed sinus with occasional PACs, PVCs and short run of atrial tachycardia. She has been intolerant to beta blockers and calcium blockers. Also intolerant to statins. Since last seen, there is no dyspnea or exertional chest pain. She does have palpitations increase with eating certain foods. No syncope.  Current Outpatient Prescriptions  Medication Sig Dispense Refill  . Calcium Carbonate-Vitamin D (CALCIUM + D PO) Take 1 tablet by mouth daily.     . cholecalciferol (VITAMIN D) 1000 UNITS tablet Take 4,000 Units by mouth daily.     . Coenzyme Q10 (COQ10) 100 MG CAPS Take 100 mg by mouth 3 (three) times daily. Reported on 07/18/2015    . fish oil-omega-3 fatty acids 1000 MG capsule Take 2 g by mouth daily. Reported on 07/18/2015    . Flaxseed, Linseed, (FLAX SEED OIL) 1000 MG CAPS Take 1 capsule by mouth daily. Reported on 07/18/2015    . MAGNESIUM PO Take by mouth daily.    . mometasone (NASONEX) 50 MCG/ACT nasal spray Place 2 sprays into the nose daily.    Marland Kitchen SYNTHROID 50 MCG tablet TAKE 1 TABLET (50 MCG TOTAL) BY MOUTH DAILY BEFORE BREAKFAST. 90 tablet 1   No current facility-administered medications for this visit.      Past Medical History:  Diagnosis Date  . Chest pain    a. negative myoview 2011 [name]);  b. 04/2010 Cath: normal cors  . Colon polyps   . Diverticulosis   . GERD (gastroesophageal reflux disease)    a. mild esophagitis on egd 2012  . Hemorrhoids   . Hiatal hernia   . Hyperlipidemia   . Hyperlipidemia    Is now taking Fish oil. Had myalgias with Vytorin and Lipitor  . Hypothyroidism   . Irritable bowel syndrome   . Palpitations    a.  04/2008 echo: EF 50-60%;  b. 21 day event monitor 2012 - occasional sinus tachycardia with rates 110-120's. Rare PAC's/PVC's. Serveral short 4-5 beat runs of a-tach.  . Personal history of colonic polyps    a. colonoscopy 2009  . Personal history of malignant neoplasm of breast   . Pre-diabetes     Past Surgical History:  Procedure Laterality Date  . BUNIONECTOMY    . CARPAL TUNNEL RELEASE     bilateral   . COLONOSCOPY    . NASAL SINUS SURGERY    . TONSILLECTOMY      Social History   Social History  . Marital status: Married    Spouse name: [name]  . Number of children: 2  . Years of education: N/A   Occupational History  . Retired     Tourist information centre manager   .  [place]   Social History Main Topics  . Smoking status: Never Smoker  . Smokeless tobacco: Never Used  . Alcohol use No  . Drug use: No  . Sexual activity: Not on file   Other Topics Concern  . Not on file   Social History Narrative  . No narrative on file    Family History  Problem Relation Age of Onset  . Colon cancer      Paternal Wiliam Ke   . Diabetes Paternal Grandmother   . Heart defect Father  ROS: no fevers or chills, productive cough, hemoptysis, dysphasia, odynophagia, melena, hematochezia, dysuria, hematuria, rash, seizure activity, orthopnea, PND, pedal edema, claudication. Remaining systems are negative.  Physical Exam: Well-developed well-nourished in no acute distress.  Skin is warm and dry.  HEENT is normal.  Neck is supple. No bruits Chest is clear to auscultation with normal expansion.  Cardiovascular exam is regular rate and rhythm.  Abdominal exam nontender or distended. No masses palpated. Extremities show no edema. neuro grossly intact  Sinus rhythm at a rate of 75. No ST changes;ECG- personally reviewed  A/P  1 palpitations-previous monitor showed PACs and PVCs. She continues to have occasional symptoms but apparently did not tolerate beta blockade previously.  I explained that we could try low-dose in the future if her symptoms worsen but she would like to avoid medications at present. Approximately 20 minutes spent reviewing records prior to patient arrival.   2 hyperlipidemia-patient has not tolerated statins. She does not want zetia. Continue diet.  3 anxiety-management per primary care.  Kirk Ruths, MD

## 2016-05-24 ENCOUNTER — Encounter: Payer: Self-pay | Admitting: Cardiology

## 2016-05-24 ENCOUNTER — Ambulatory Visit (INDEPENDENT_AMBULATORY_CARE_PROVIDER_SITE_OTHER): Payer: PPO | Admitting: Cardiology

## 2016-05-24 ENCOUNTER — Encounter: Payer: Self-pay | Admitting: Physician Assistant

## 2016-05-24 VITALS — BP 138/62 | HR 75 | Ht 62.0 in | Wt 108.6 lb

## 2016-05-24 DIAGNOSIS — E7800 Pure hypercholesterolemia, unspecified: Secondary | ICD-10-CM

## 2016-05-24 DIAGNOSIS — R002 Palpitations: Secondary | ICD-10-CM

## 2016-05-24 NOTE — Patient Instructions (Signed)
Your physician wants you to follow-up in: ONE YEAR WITH DR CRENSHAW You will receive a reminder letter in the mail two months in advance. If you don't receive a letter, please call our office to schedule the follow-up appointment.   If you need a refill on your cardiac medications before your next appointment, please call your pharmacy.  

## 2016-06-02 ENCOUNTER — Telehealth: Payer: Self-pay | Admitting: Cardiology

## 2016-06-02 NOTE — Telephone Encounter (Signed)
Please call,having more irregular heart beats and palpitations.

## 2016-06-02 NOTE — Telephone Encounter (Signed)
She has had recent monitor; if symptoms worsen could try very low dose b blocker Kirk Ruths

## 2016-06-02 NOTE — Telephone Encounter (Signed)
Spoke with pt, she has noticed an increase in palpitations recently. Yesterday while cleaning the house, she had significant skipping and heart racing all day. Her pulse ox showed a heart rate of 100 bpm most of the day. She had no chest pain or SOB yesterday. Today she is tired and her pulse is around 80 bpm. She would like to avoid beta blockers as she has had problems with those in the past. She is wondering if she needs to wear a monitor. She denies any changes in diet, caffeine intake or new medications. Will forward for dr Stanford Breed review

## 2016-06-02 NOTE — Telephone Encounter (Signed)
Spoke with pt, Aware of dr crenshaw's recommendations.  °

## 2016-06-21 ENCOUNTER — Ambulatory Visit (INDEPENDENT_AMBULATORY_CARE_PROVIDER_SITE_OTHER): Payer: PPO | Admitting: Physician Assistant

## 2016-06-21 ENCOUNTER — Encounter: Payer: Self-pay | Admitting: Physician Assistant

## 2016-06-21 ENCOUNTER — Other Ambulatory Visit: Payer: Self-pay | Admitting: Physician Assistant

## 2016-06-21 VITALS — BP 134/80 | HR 76 | Temp 97.7°F | Resp 16 | Ht 62.0 in | Wt 109.2 lb

## 2016-06-21 DIAGNOSIS — Z79899 Other long term (current) drug therapy: Secondary | ICD-10-CM

## 2016-06-21 DIAGNOSIS — R300 Dysuria: Secondary | ICD-10-CM | POA: Diagnosis not present

## 2016-06-21 DIAGNOSIS — Z1159 Encounter for screening for other viral diseases: Secondary | ICD-10-CM | POA: Diagnosis not present

## 2016-06-21 DIAGNOSIS — J320 Chronic maxillary sinusitis: Secondary | ICD-10-CM

## 2016-06-21 DIAGNOSIS — R002 Palpitations: Secondary | ICD-10-CM

## 2016-06-21 DIAGNOSIS — D649 Anemia, unspecified: Secondary | ICD-10-CM | POA: Diagnosis not present

## 2016-06-21 LAB — HEPATIC FUNCTION PANEL
ALK PHOS: 220 U/L — AB (ref 33–130)
ALT: 173 U/L — AB (ref 6–29)
AST: 59 U/L — AB (ref 10–35)
Albumin: 4.4 g/dL (ref 3.6–5.1)
BILIRUBIN INDIRECT: 0.2 mg/dL (ref 0.2–1.2)
Bilirubin, Direct: 0.1 mg/dL (ref <=0.2)
TOTAL PROTEIN: 7.5 g/dL (ref 6.1–8.1)
Total Bilirubin: 0.3 mg/dL (ref 0.2–1.2)

## 2016-06-21 LAB — IRON AND TIBC
%SAT: 18 % (ref 11–50)
IRON: 64 ug/dL (ref 45–160)
TIBC: 356 ug/dL (ref 250–450)
UIBC: 292 ug/dL (ref 125–400)

## 2016-06-21 LAB — BASIC METABOLIC PANEL WITH GFR
BUN: 16 mg/dL (ref 7–25)
CHLORIDE: 100 mmol/L (ref 98–110)
CO2: 27 mmol/L (ref 20–31)
Calcium: 10.2 mg/dL (ref 8.6–10.4)
Creat: 0.77 mg/dL (ref 0.50–0.99)
GFR, EST NON AFRICAN AMERICAN: 79 mL/min (ref >=60)
GFR, Est African American: >89 mL/min (ref >=60)
GLUCOSE: 84 mg/dL (ref 65–99)
POTASSIUM: 4.5 mmol/L (ref 3.5–5.3)
Sodium: 138 mmol/L (ref 135–146)

## 2016-06-21 LAB — CBC WITH DIFFERENTIAL/PLATELET
BASOS PCT: 0 %
Basophils Absolute: 0 cells/uL (ref 0–200)
EOS ABS: 76 {cells}/uL (ref 15–500)
Eosinophils Relative: 1 %
HCT: 40.1 % (ref 35.0–45.0)
HEMOGLOBIN: 13.3 g/dL (ref 11.7–15.5)
LYMPHS ABS: 1900 {cells}/uL (ref 850–3900)
Lymphocytes Relative: 25 %
MCH: 28.9 pg (ref 27.0–33.0)
MCHC: 33.2 g/dL (ref 32.0–36.0)
MCV: 87.2 fL (ref 80.0–100.0)
MONO ABS: 456 {cells}/uL (ref 200–950)
MPV: 9.8 fL (ref 7.5–12.5)
Monocytes Relative: 6 %
NEUTROS PCT: 68 %
Neutro Abs: 5168 cells/uL (ref 1500–7800)
Platelets: 367 10*3/uL (ref 140–400)
RBC: 4.6 MIL/uL (ref 3.80–5.10)
RDW: 14 % (ref 11.0–15.0)
WBC: 7.6 10*3/uL (ref 3.8–10.8)

## 2016-06-21 MED ORDER — AZITHROMYCIN 250 MG PO TABS: ORAL_TABLET | ORAL | 1 refills | Status: AC

## 2016-06-21 NOTE — Patient Instructions (Signed)
HOW TO TREAT VIRAL COUGH AND COLD SYMPTOMS:  -Symptoms usually last at least 1 week with the worst symptoms being around day 4.  - colds usually start with a sore throat and end with a cough, and the cough can take 2 weeks to get better.  -No antibiotics are needed for colds, flu, sore throats, cough, bronchitis UNLESS symptoms are longer than 7 days OR if you are getting better then get drastically worse.  -There are a lot of combination medications (Dayquil, Nyquil, Vicks 44, tyelnol cold and sinus, ETC). Please look at the ingredients on the back so that you are treating the correct symptoms and not doubling up on medications/ingredients.    Medicines you can use  Nasal congestion  - pseudoephedrine (Sudafed)- behind the counter, do not use if you have high blood pressure, medicine that have -D in them.  - phenylephrine (Sudafed PE) -Dextormethorphan + chlorpheniramine (Coridcidin HBP)- okay if you have high blood pressure -Oxymetazoline (Afrin) nasal spray- LIMIT to 3 days -Saline nasal spray -Neti pot (used distilled or bottled water)  Ear pain/congestion  -pseudoephedrine (sudafed) - Nasonex/flonase nasal spray  Fever  -Acetaminophen (Tyelnol) -Ibuprofen (Advil, motrin, aleve)  Sore Throat  -Acetaminophen (Tyelnol) -Ibuprofen (Advil, motrin, aleve) -Drink a lot of water -Gargle with salt water - Rest your voice (don't talk) -Throat sprays -Cough drops  Body Aches  -Acetaminophen (Tyelnol) -Ibuprofen (Advil, motrin, aleve)  Headache  -Acetaminophen (Tyelnol) -Ibuprofen (Advil, motrin, aleve) - Exedrin, Exedrin Migraine  Allergy symptoms (cough, sneeze, runny nose, itchy eyes) -Claritin or loratadine cheapest but likely the weakest  -Zyrtec or certizine at night because it can make you sleepy -The strongest is allegra or fexafinadine  Cheapest at [employer], [employer], [employer]  Cough  -Dextromethorphan (Delsym)- medicine that has DM in it -Guafenesin  (Mucinex/Robitussin) - cough drops - drink lots of water  Chest Congestion  -Guafenesin (Mucinex/Robitussin)  Red Itchy Eyes  - Naphcon-A  Upset Stomach  - Bland diet (nothing spicy, greasy, fried, and high acid foods like tomatoes, oranges, berries) -OKAY- cereal, bread, soup, crackers, rice -Eat smaller more frequent meals -reduce caffeine, no alcohol -Loperamide (Imodium-AD) if diarrhea -Prevacid for heart burn  General health when sick  -Hydration -wash your hands frequently -keep surfaces clean -change pillow cases and sheets often -Get fresh air but do not exercise strenuously -Vitamin D, double up on it - Vitamin C -Zinc       Palpitations A palpitation is the feeling that your heartbeat is irregular or is faster than normal. It may feel like your heart is fluttering or skipping a beat. Palpitations are usually not a serious problem. They may be caused by many things, including smoking, caffeine, alcohol, stress, and certain medicines. Although most causes of palpitations are not serious, palpitations can be a sign of a serious medical problem. In some cases, you may need further medical evaluation. Follow these instructions at home: Pay attention to any changes in your symptoms. Take these actions to help with your condition:  Avoid the following:  Caffeinated coffee, tea, soft drinks, diet pills, and energy drinks.  Chocolate.  Alcohol.  Do not use any tobacco products, such as cigarettes, chewing tobacco, and e-cigarettes. If you need help quitting, ask your health care provider.  Try to reduce your stress and anxiety. Things that can help you relax include:  Yoga.  Meditation.  Physical activity, such as swimming, jogging, or walking.  Biofeedback. This is a method that helps you learn to use your mind to  control things in your body, such as your heartbeats.  Get plenty of rest and sleep.  Take over-the-counter and prescription medicines only as  told by your health care provider.  Keep all follow-up visits as told by your health care provider. This is important. Contact a health care provider if:  You continue to have a fast or irregular heartbeat after 24 hours.  Your palpitations occur more often. Get help right away if:  You have chest pain or shortness of breath.  You have a severe headache.  You feel dizzy or you faint. This information is not intended to replace advice given to you by your health care provider. Make sure you discuss any questions you have with your health care provider. Document Released: 03/05/2000 Document Revised: 08/11/2015 Document Reviewed: 11/21/2014 Elsevier Interactive Patient Education  2017 [employer].

## 2016-06-21 NOTE — Progress Notes (Signed)
Subjective:    Patient ID: Andrea Bates, female    DOB: Jul 16, 1945, 71 y.o.   MRN: [id number]  HPI 71 y.o. WF presents with sinusitis. She has history of sinusitis, can not tolerate many medications. She is on flonase, she has had sinus pressure, sinus pain, congestion, 2 weeks.   She also complains of palpitations/skipping beats.   Blood pressure 134/80, pulse 76, temperature 97.7 F (36.5 C), resp. rate 16, height 5\' 2"  (1.575 m), weight 109 lb 3.2 oz (49.5 kg), SpO2 98 %.   Medications Current Outpatient Prescriptions on File Prior to Visit  Medication Sig  . Calcium Carbonate-Vitamin D (CALCIUM + D PO) Take 1 tablet by mouth daily.   . cholecalciferol (VITAMIN D) 1000 UNITS tablet Take 4,000 Units by mouth daily.   . Coenzyme Q10 (COQ10) 100 MG CAPS Take 100 mg by mouth 3 (three) times daily. Reported on 07/18/2015  . fish oil-omega-3 fatty acids 1000 MG capsule Take 2 g by mouth daily. Reported on 07/18/2015  . Flaxseed, Linseed, (FLAX SEED OIL) 1000 MG CAPS Take 1 capsule by mouth daily. Reported on 07/18/2015  . MAGNESIUM PO Take by mouth daily.  . mometasone (NASONEX) 50 MCG/ACT nasal spray Place 2 sprays into the nose daily.  Marland Kitchen SYNTHROID 50 MCG tablet TAKE 1 TABLET (50 MCG TOTAL) BY MOUTH DAILY BEFORE BREAKFAST.   No current facility-administered medications on file prior to visit.     Problem list She has Hypothyroidism; Hyperlipidemia; Diverticulosis of large intestine; PERSONAL HX BREAST CANCER; History of colonic polyps; Syncope (VasoVagal Faint); Palpitations; VERTIGO; Dysphagia; Constipation; Fatigue; Vitamin D deficiency; Prediabetes; Medication management; Flushing; Encounter for Medicare annual wellness exam; Abdominal pain, epigastric; Gastroesophageal reflux disease without esophagitis; and Globus sensation on her problem list.   Review of Systems  Constitutional: Negative for chills and diaphoresis.  HENT: Positive for congestion, postnasal drip, sinus  pressure and sneezing. Negative for ear pain and sore throat.   Respiratory: Positive for cough. Negative for chest tightness, shortness of breath and wheezing.   Cardiovascular: Positive for palpitations. Negative for chest pain and leg swelling.  Gastrointestinal: Negative.   Genitourinary: Negative.   Musculoskeletal: Negative for neck pain.  Neurological: Positive for headaches.       Objective:   Physical Exam  Constitutional: She appears well-developed and well-nourished.  HENT:  Head: Normocephalic and atraumatic.  Right Ear: External ear normal.  Nose: Right sinus exhibits maxillary sinus tenderness. Right sinus exhibits no frontal sinus tenderness. Left sinus exhibits maxillary sinus tenderness. Left sinus exhibits no frontal sinus tenderness.  Eyes: Conjunctivae and EOM are normal.  Neck: Normal range of motion. Neck supple.  Cardiovascular: Normal rate, regular rhythm, normal heart sounds and intact distal pulses.   Pulmonary/Chest: Effort normal and breath sounds normal. No respiratory distress. She has no wheezes.  Abdominal: Soft. Bowel sounds are normal.  Lymphadenopathy:    She has no cervical adenopathy.  Skin: Skin is warm and dry.      Assessment & Plan:  1. Left maxillary sinusitis Will hold the zpak and take if she is not getting better, increase fluids, rest, cont allergy pill  2. Palpitations Will check labs, if not better will go to cardiology and get holter monitor Patient reassured, no accompaniments, if any accompaniments with it will go to ER - TSH - CBC with Differential/Platelet - BASIC METABOLIC PANEL WITH GFR - Hepatic function panel - Magnesium - Urinalysis, Routine w reflex microscopic - Urine culture - Iron and TIBC -  Ferritin - Vitamin B12  No future appointments.

## 2016-06-22 ENCOUNTER — Telehealth: Payer: Self-pay | Admitting: Cardiology

## 2016-06-22 DIAGNOSIS — R002 Palpitations: Secondary | ICD-10-CM

## 2016-06-22 LAB — URINALYSIS, ROUTINE W REFLEX MICROSCOPIC
BILIRUBIN URINE: NEGATIVE
GLUCOSE, UA: NEGATIVE
Ketones, ur: NEGATIVE
LEUKOCYTES UA: NEGATIVE
Nitrite: NEGATIVE
PH: 5.5 (ref 5.0–8.0)
PROTEIN: NEGATIVE
Specific Gravity, Urine: 1.007 (ref 1.001–1.035)

## 2016-06-22 LAB — URINALYSIS, MICROSCOPIC ONLY
Bacteria, UA: NONE SEEN [HPF]
Casts: NONE SEEN [LPF]
Crystals: NONE SEEN [HPF]
RBC / HPF: NONE SEEN RBC/HPF (ref <=2)
Squamous Epithelial / LPF: NONE SEEN [HPF] (ref <=5)
WBC UA: NONE SEEN WBC/HPF (ref <=5)
YEAST: NONE SEEN [HPF]

## 2016-06-22 LAB — TSH: TSH: 1.88 mIU/L

## 2016-06-22 LAB — URINE CULTURE: ORGANISM ID, BACTERIA: NO GROWTH

## 2016-06-22 LAB — VITAMIN B12

## 2016-06-22 LAB — HEPATITIS C ANTIBODY: HCV AB: NEGATIVE

## 2016-06-22 LAB — FERRITIN: FERRITIN: 71 ng/mL (ref 20–288)

## 2016-06-22 LAB — MAGNESIUM: Magnesium: 2.4 mg/dL (ref 1.5–2.5)

## 2016-06-22 NOTE — Telephone Encounter (Signed)
New Message  Pt voiced to have nurse to give her a call

## 2016-06-22 NOTE — Telephone Encounter (Signed)
Pt states that she states that her palpitations have increased more lately she states "like yesterday she states that they happened 30-50 times before noon" and intermittent irregular HR.  Pt states that she would like to have [place] or monitor done. Just to ease her mind. Please advise

## 2016-06-22 NOTE — Telephone Encounter (Signed)
Ok for 24 hr holter [employer]

## 2016-06-22 NOTE — Telephone Encounter (Signed)
Pt notified she will await call from scheduling

## 2016-07-06 IMAGING — RF DG ESOPHAGUS
8 of 9 series · 12 of 24 positions shown · non-contrast
Comparison: CT of 08/09/2013

CLINICAL DATA: Epigastric pain and esophageal spasms.
Gastroesophageal reflux disease.

EXAM:
ESOPHOGRAM / BARIUM SWALLOW / BARIUM TABLET STUDY
TECHNIQUE: Combined double contrast and single contrast examination performed
using effervescent crystals, thick barium liquid, and thin barium
liquid. The patient was observed with fluoroscopy swallowing a 13 mm
barium sulphate tablet.
FLUOROSCOPY TIME:  Radiation Exposure Index (as provided by the
fluoroscopic device): 5.2 mGy

[Series 2: cp_standard · 0.36mm/px · 2 of 23 frames shown (1 of 8)]
[frame 4/23]
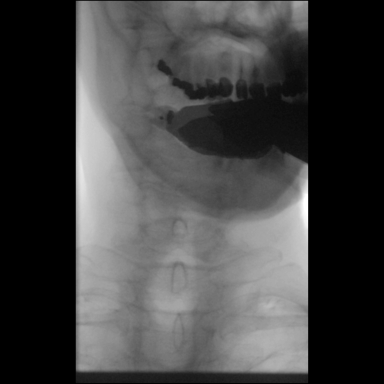
[frame 19/23]
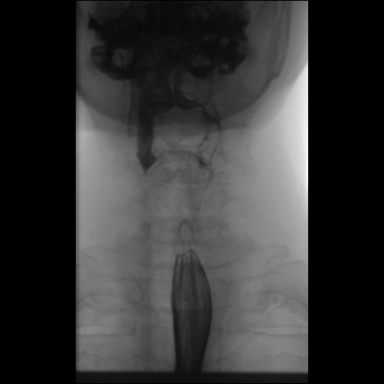

[Series 3: cp_standard · 0.27mm/px · 1 of 1 slices shown (2 of 8)]
[im 1/1]
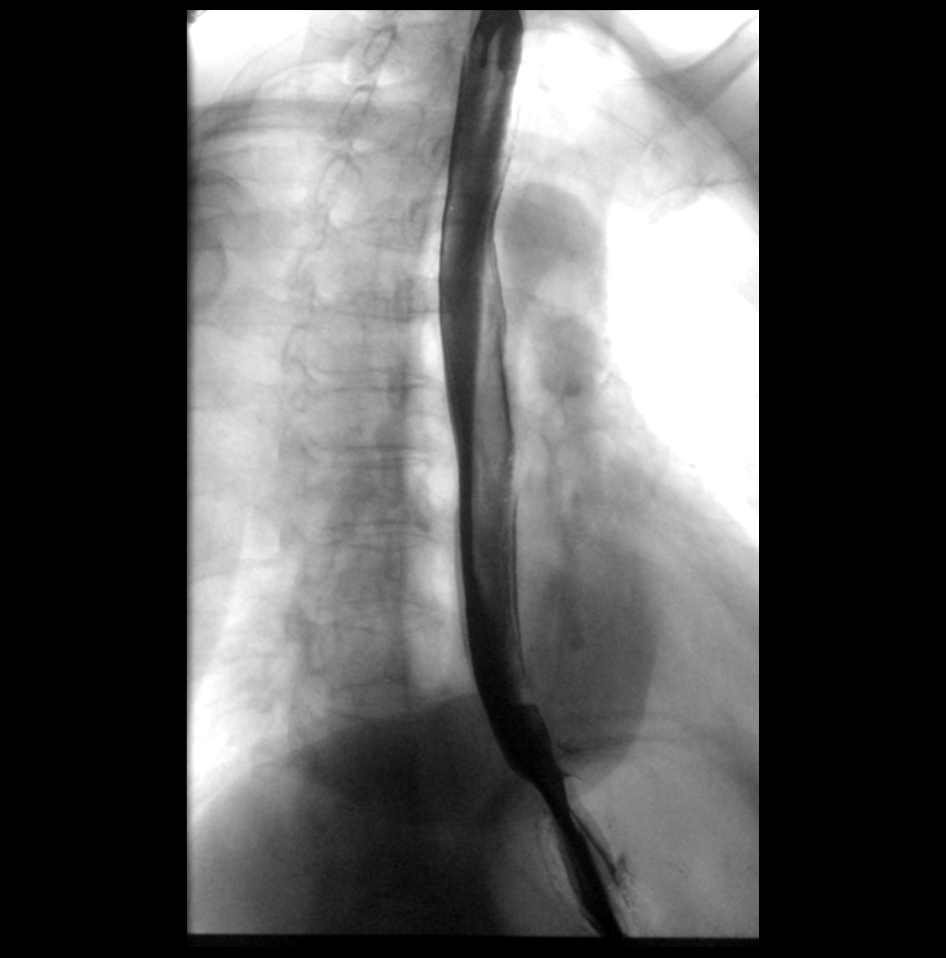

[Series 4: cp_standard · 0.55mm/px · 1 of 24 frames shown (3 of 8)]
[frame 21/24]
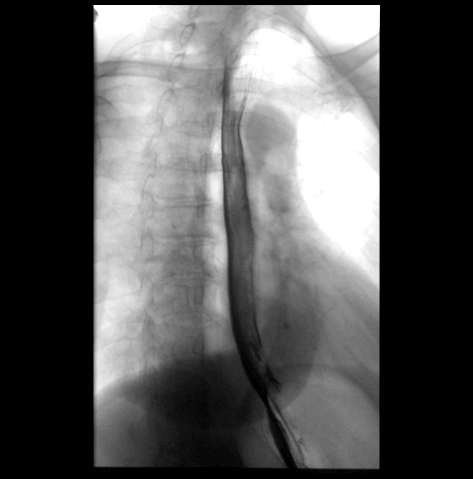

[Series 5: cp_standard · 0.56mm/px · 2 of 29 frames shown (4 of 8)]
[frame 5/29]
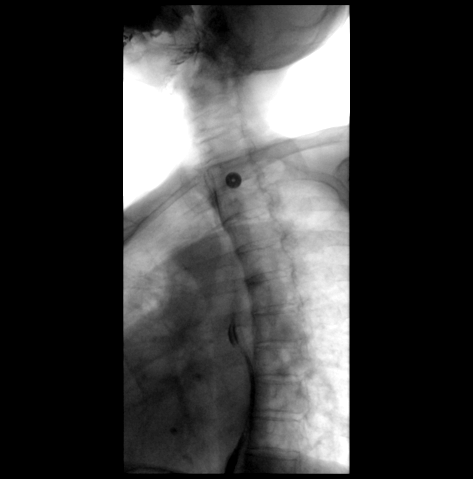
[frame 25/29]
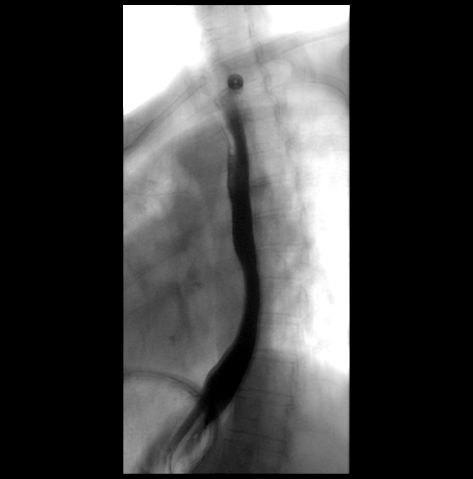

[Series 6: cp_standard · 0.56mm/px · 2 of 35 frames shown (5 of 8)]
[frame 9/35]
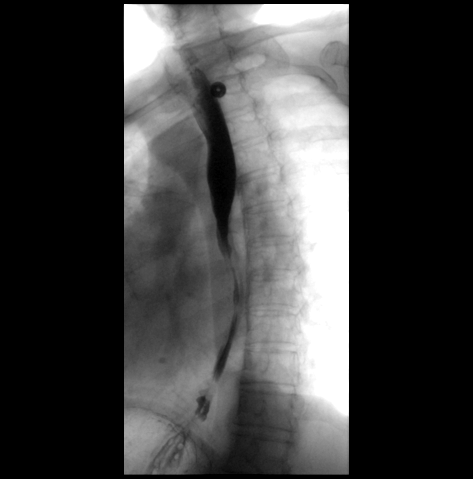
[frame 30/35]
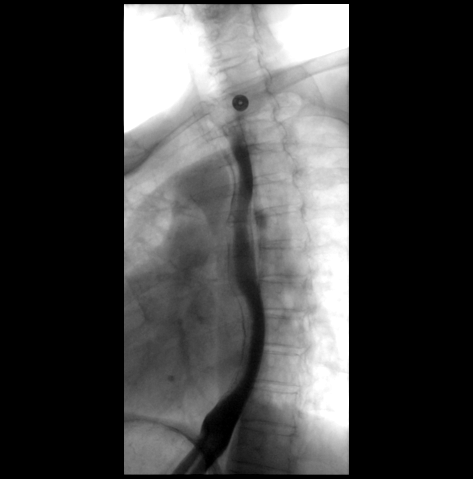

[Series 7: cp_standard · 0.56mm/px · 1 of 39 frames shown (6 of 8)]
[frame 20/39]
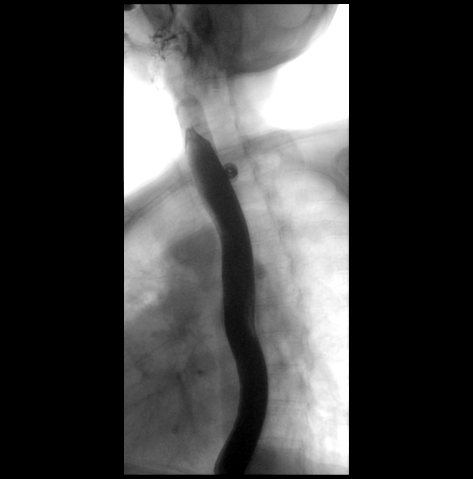

[Series 8: cp_standard · 0.38mm/px · 2 of 34 frames shown (7 of 8)]
[frame 6/34]
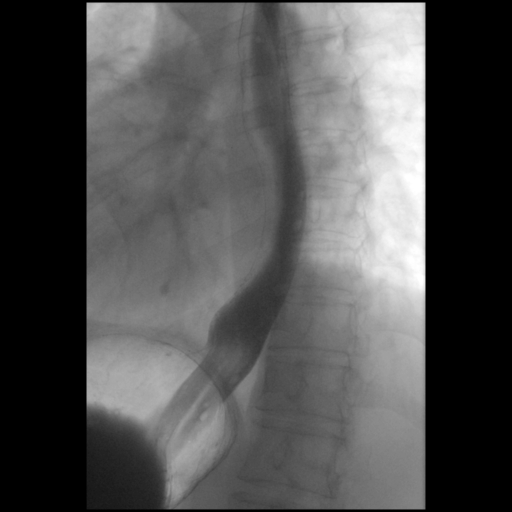
[frame 18/34]
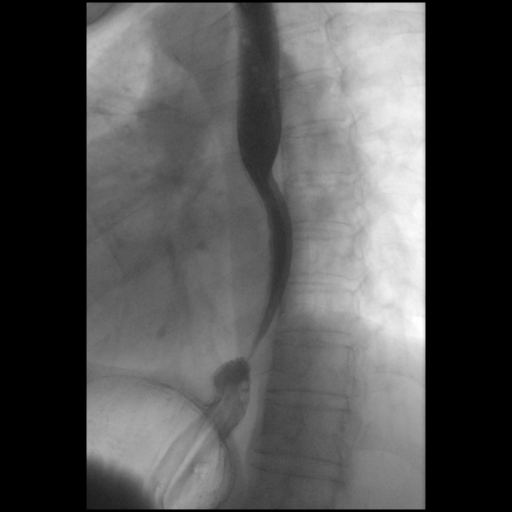

[Series 9: cp_standard · 0.27mm/px · 1 of 1 slices shown (8 of 8)]
[im 1/1]
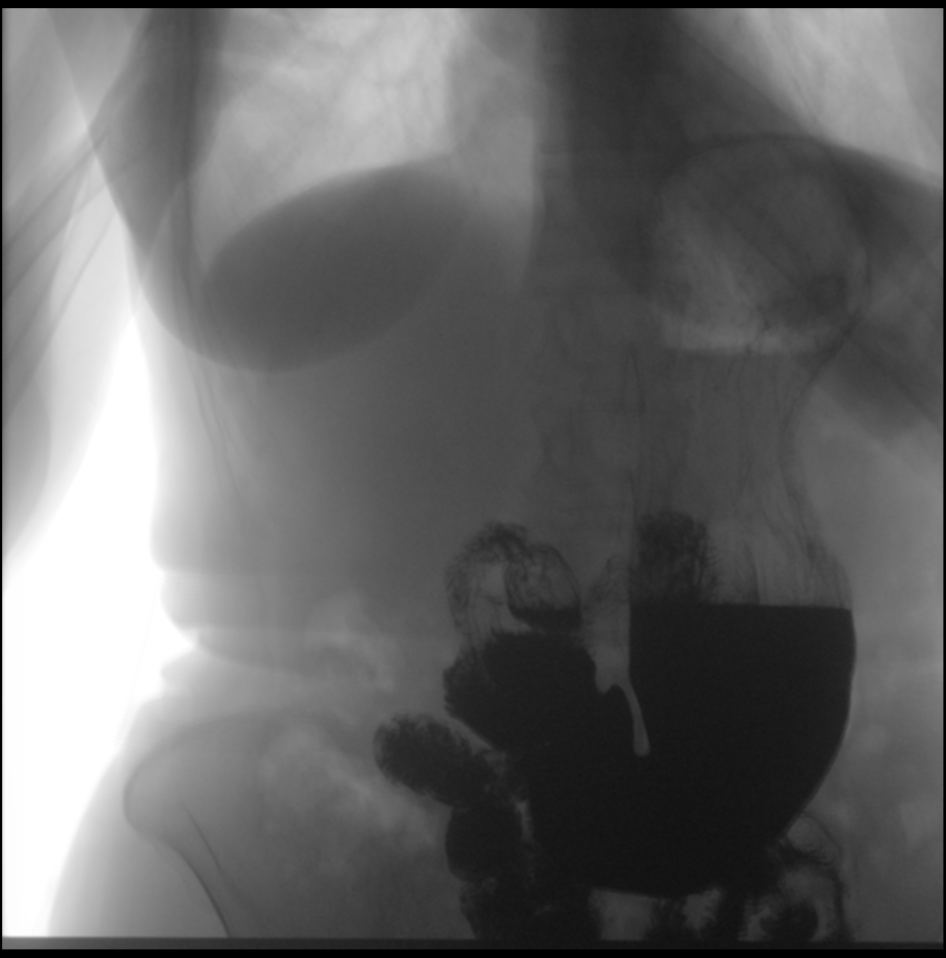

[12 of 24 positions shown; findings below may reference images not displayed]

FINDINGS: Preprocedure scout film unremarkable. Hypopharyngeal portion of the
exam is unremarkable.

Double contrast evaluation of the esophagus demonstrates no mucosal
abnormality.

Incomplete primary peristaltic wave on each swallow with contrast
stasis in the mid and lower thoracic esophagus.

Full column evaluation of the esophagus demonstrates no persistent
narrowing or stricture. A tiny transient hiatal hernia is
identified.

A 13 mm barium tablet passes promptly.
IMPRESSION: 1. Mild esophageal dysmotility, likely presbyesophagus.
2. Tiny, transient hiatal hernia.

## 2016-07-08 IMAGING — CR DG ABDOMEN 1V
1 series · 1 of 1 positions shown · non-contrast
Comparison: Esophagram on 07/23/2015

CLINICAL DATA: Abdominal pain. Patient is scheduled for upper GI
series.

EXAM:
ABDOMEN - 1 VIEW

[t abdomen supine]
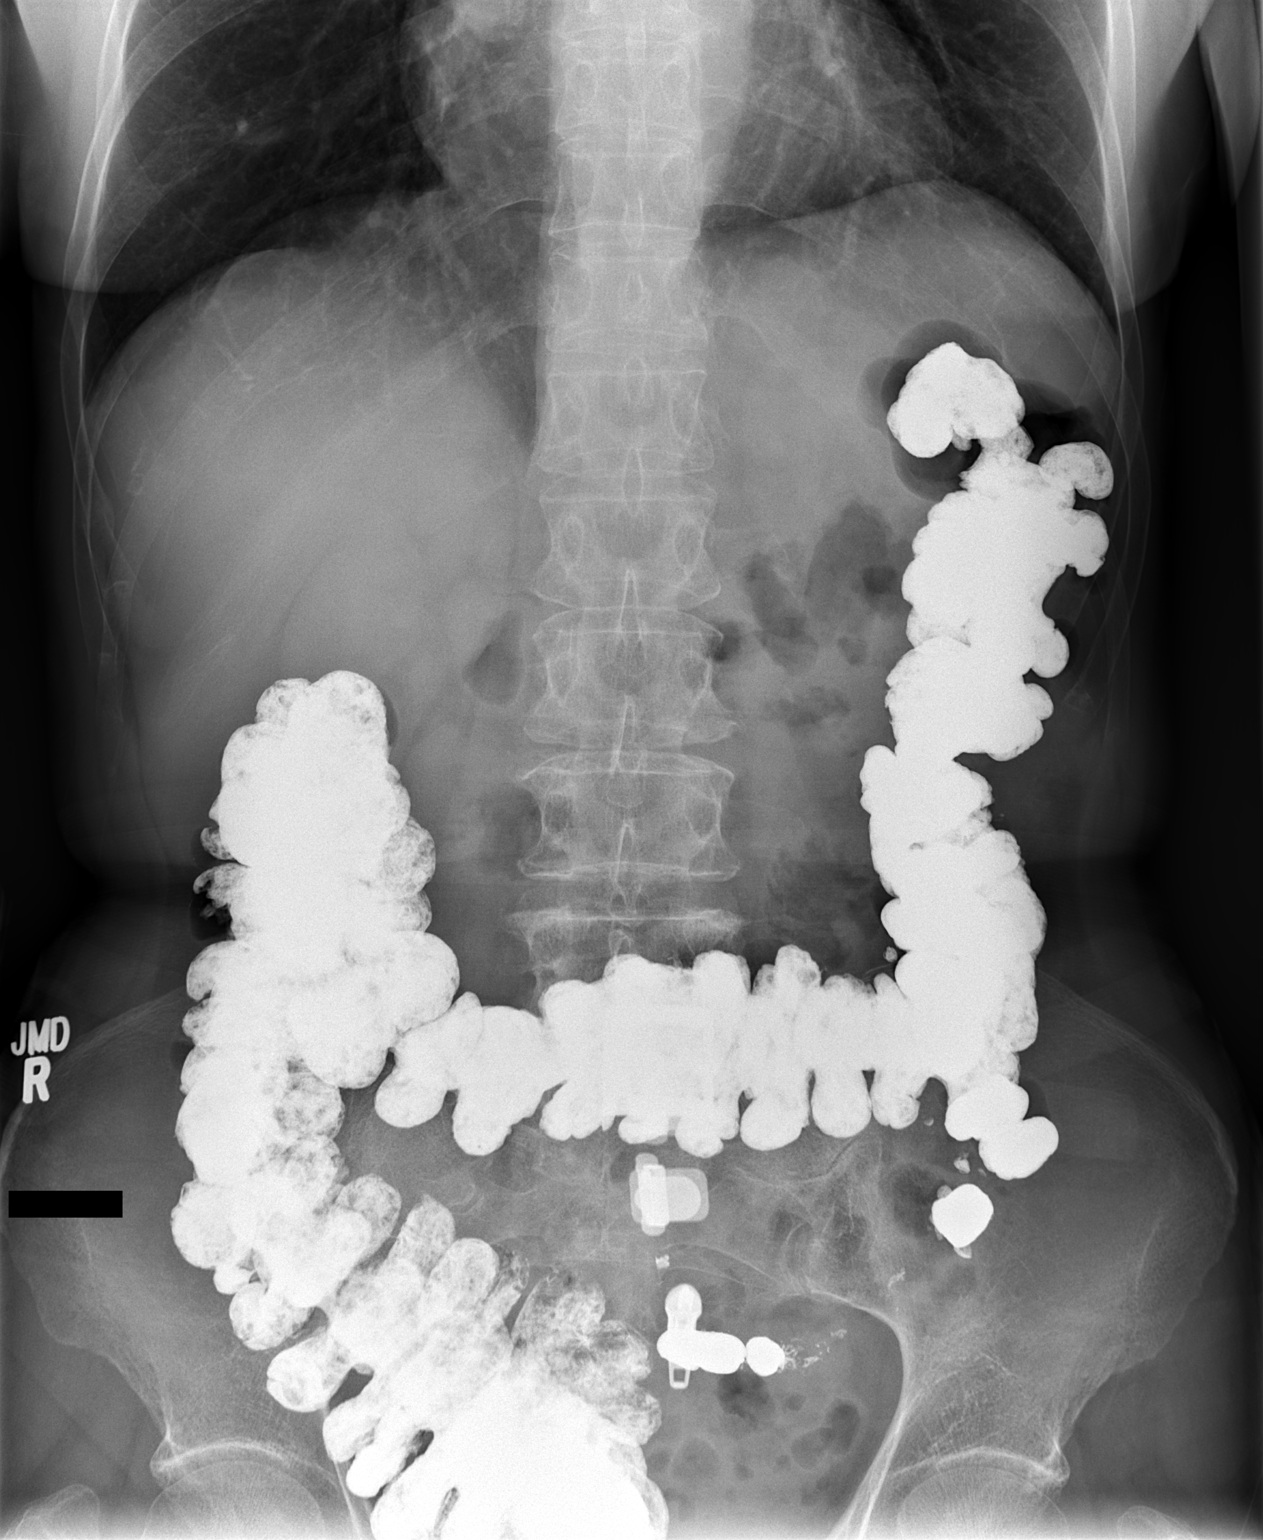

[1 of 1 positions shown; findings below may reference images not displayed]

FINDINGS: No evidence of dilated bowel loops. A large amount of residual
barium is seen throughout the colon. This precludes performance of a
diagnostic upper GI exam today. This will need to be rescheduled.
IMPRESSION: Large amount of residual barium throughout the colon from recent
esophagram. Diagnostic upper GI exam cannot be performed today, and
will need to be rescheduled.

## 2016-07-14 NOTE — Progress Notes (Signed)
MEDICARE ANNUAL WELLNESS VISIT AND FOLLOW UP  Assessment:    Hyperlipidemia Intolerant to statins, declines treatment   Palpitations Normal cath, normal stress echo, normal holter Still having palpations, states had some SOb this time, will follow up cardiology   PERSONAL HX BREAST CANCER OVER DUE FOR MGM, will get one   History of colonic polyps Up to date  Prediabetes Better with diet.   Medication management Continue medication   Hypothyroidism, unspecified hypothyroidism type will keep range closer to 4 due to anxiety/palpitations.   Constipation, unspecified constipation type Constipation- Increase fiber/ water intake, decrease caffeine, increase activity level, can add bulk (psyllium) until BM soft. Laboratory tests per orders. Please go to the hospital if you have severe abdominal pain, vomiting, fever, CP, SOB.    Dysphagia/GERD Has had normal EGD,  follow up GI.    Diverticulosis of large intestine without hemorrhage Normal colonoscopy, increase fiber   Anxiety LONG discussion about anxiety/depresssion,  did explain in full length/detail that all cardiac work ups have been negative and she is low risk She will however follow up with her cardiologist Suggesting seeing psych/counseling  Health Maintenance Patient declines, understands the risks of not getting the vaccine  Osteopenia Get DEXA   VERTIGO Normal neuro  Other fatigue -     Hepatic function panel -     CBC with Differential/Platelet -     BASIC METABOLIC PANEL WITH GFR  Medication management -     Hepatic function panel -     CBC with Differential/Platelet -     BASIC METABOLIC PANEL WITH GFR  Encounter for Medicare annual wellness exam  Elevated liver function Check labs, may need [place] if still elevated  Plan:   During the course of the visit the patient was educated and counseled about appropriate screening and preventive services including:    Pneumococcal vaccine   Influenza  vaccine  Td vaccine  Screening electrocardiogram  Screening mammography  Bone densitometry screening  Colorectal cancer screening  Diabetes screening  Glaucoma screening  Nutrition counseling   Advanced directives: given info/requested   Subjective:   Andrea Bates is a 71 y.o. female who presents for Medicare Annual Wellness Visit and 3 month follow up on hypertension, hyperlipidemia, vitamin D def.   Her blood pressure has been controlled at home, today their BP is BP: 140/76 She does workout. She denies chest pain, shortness of breath, dizziness.   She has had extensive work ups from multiple doctors, has had Normal cath 04/2010, normal stress echo, normal holter, normal CT AB/pelvis/chest, normal colonoscopy/EGD, normal labs. She has high anxiety about her heart and cancer, declines treatment.  She is not on cholesterol medication and denies myalgias. Her cholesterol is at goal. The cholesterol last visit was:   Lab Results  Component Value Date   CHOL 255 (H) 07/16/2015   HDL 79 07/16/2015   LDLCALC 165 (H) 07/16/2015   LDLDIRECT 180.1 02/01/2013   TRIG 55 07/16/2015   CHOLHDL 3.2 07/16/2015   She has been working on diet and exercise for prediabetes, and denies paresthesia of the feet, polydipsia, polyuria and visual disturbances. Last A1C in the office was:  Lab Results  Component Value Date   HGBA1C 5.2 07/16/2015   Patient is on Vitamin D supplement. She has stopped her vitamins, B6 and states that she is doing better. Lab Results  Component Value Date   ALT 173 (H) 06/21/2016   AST 59 (H) 06/21/2016   ALKPHOS 220 (H) 06/21/2016  BILITOT 0.3 06/21/2016   She is on thyroid medication. Her medication was changed last visit, she is now on 53mcg 1/2 a pill a day, and did not take it for 3-4 days.   Lab Results  Component Value Date   TSH 1.88 06/21/2016  .  BMI is Body mass index is 19.28 kg/m., she is working on diet and exercise. Wt Readings from  Last 3 Encounters:  07/15/16 105 lb 6.4 oz (47.8 kg)  06/21/16 109 lb 3.2 oz (49.5 kg)  05/24/16 108 lb 9.6 oz (49.3 kg)    Medication Review Current Outpatient Prescriptions on File Prior to Visit  Medication Sig Dispense Refill  . Calcium Carbonate-Vitamin D (CALCIUM + D PO) Take 1 tablet by mouth daily.     . cholecalciferol (VITAMIN D) 1000 UNITS tablet Take 4,000 Units by mouth daily.     . Coenzyme Q10 (COQ10) 100 MG CAPS Take 100 mg by mouth 3 (three) times daily. Reported on 07/18/2015    . fish oil-omega-3 fatty acids 1000 MG capsule Take 2 g by mouth daily. Reported on 07/18/2015    . Flaxseed, Linseed, (FLAX SEED OIL) 1000 MG CAPS Take 1 capsule by mouth daily. Reported on 07/18/2015    . Lactobacillus (PROBIOTIC ACIDOPHILUS) TABS Take by mouth.    Marland Kitchen MAGNESIUM PO Take by mouth daily.    . mometasone (NASONEX) 50 MCG/ACT nasal spray Place 2 sprays into the nose daily.    . Potassium (POTASSIMIN PO) Take 99 mg by mouth.    . SYNTHROID 50 MCG tablet TAKE 1 TABLET (50 MCG TOTAL) BY MOUTH DAILY BEFORE BREAKFAST. 90 tablet 1   No current facility-administered medications on file prior to visit.     Current Problems (verified) Patient Active Problem List   Diagnosis Date Noted  . Abdominal pain, epigastric 07/23/2015  . Gastroesophageal reflux disease without esophagitis 07/23/2015  . Globus sensation 07/23/2015  . Encounter for Medicare annual wellness exam 07/16/2015  . Flushing 02/28/2014  . Vitamin D deficiency 06/06/2013  . Prediabetes 06/06/2013  . Medication management 06/06/2013  . Fatigue 02/02/2013  . Dysphagia 07/05/2010  . Constipation 07/05/2010  . VERTIGO 06/08/2010  . Palpitations 06/02/2010  . Syncope (VasoVagal Faint) 04/27/2010  . Hypothyroidism 03/31/2009  . Diverticulosis of large intestine 03/31/2009  . PERSONAL HX BREAST CANCER 03/31/2009  . History of colonic polyps 03/31/2009  . Hyperlipidemia 05/07/2008    Screening Tests Immunization History   Administered Date(s) Administered  . DT 12/11/2014  . PPD Test 12/11/2014  . Td 11/19/2002   Preventative care: Last colonoscopy: 2015 EGD 2015 Last mammogram: OVER DUE Last pap smear/pelvic exam: 2 years  DEXA: OVER DUE Echo 06/2013 normal Holter monitor x 2 AB [place] 2008 CT AB/PELVIS- 07/2013 CT chest 2012 Gastric emptying 2012  Prior vaccinations: Patient declines, understands the risks of not getting the vaccine  Names of Other Physician/Practitioners you currently use: 1. [facility]- here for primary care 2. [facility], eye doctor, last visit Feb 2017 3. Dr. Nicki Reaper, dentist, last visit 4 years ago Patient Care Team: Unk Pinto, MD as PCP - General (Internal Medicine) Irene Shipper, MD as Consulting Physician (Gastroenterology) Larey Dresser, MD as Consulting Physician (Cardiology)  Allergies Allergies  Allergen Reactions  . Armour Thyroid [Thyroid]     Palpitations   . Atenolol     presyncope  . Celexa [Citalopram Hydrobromide]     Severe chest tightness   . Diltiazem Hcl  Dropped bp  . Levaquin [Levofloxacin In D5w] Nausea And Vomiting  . Lipitor [Atorvastatin]     Myalgias   . Propranolol Hcl     Felt bad  . Vytorin [Ezetimibe-Simvastatin]     Myalgias     SURGICAL HISTORY She  has a past surgical history that includes Nasal sinus surgery; Tonsillectomy; Carpal tunnel release; Colonoscopy; and Bunionectomy. FAMILY HISTORY Her family history includes Diabetes in her paternal grandmother; Heart defect in her father. SOCIAL HISTORY She  reports that she has never smoked. She has never used smokeless tobacco. She reports that she does not drink alcohol or use drugs. MEDICARE WELLNESS OBJECTIVES: Physical activity: Current Exercise Habits: Home exercise routine, Type of exercise: walking, Time (Minutes): 30, Frequency (Times/Week): 4, Weekly Exercise (Minutes/Week): 120, Intensity: Mild Cardiac risk  factors: Cardiac Risk Factors include: advanced age (>63men, >11 women);dyslipidemia;family history of premature cardiovascular disease;hypertension Depression/mood screen:   Depression screen [facility] 2/9 07/15/2016  Decreased Interest 0  Down, Depressed, Hopeless 0  PHQ - 2 Score 0    ADLs:  In your present state of health, do you have any difficulty performing the following activities: 07/15/2016 07/21/2015  Hearing? N N  Vision? N N  Difficulty concentrating or making decisions? N N  Walking or climbing stairs? N N  Dressing or bathing? N N  Doing errands, shopping? N N  Some recent data might be hidden     Cognitive Testing  Alert? Yes  Normal Appearance?Yes  Oriented to person? Yes  Place? Yes   Time? Yes  Recall of three objects?  Yes  Can perform simple calculations? Yes  Displays appropriate judgment?Yes  Can read the correct time from a watch face?Yes  EOL planning: Does Patient Have a Medical Advance Directive?: No Would patient like information on creating a medical advance directive?: Yes (MAU/Ambulatory/Procedural Areas - Information given)    Objective:   Blood pressure 140/76, pulse 82, temperature 97.7 F (36.5 C), resp. rate 14, height 5\' 2"  (1.575 m), weight 105 lb 6.4 oz (47.8 kg), SpO2 96 %. Body mass index is 19.28 kg/m.  General appearance: alert, no distress, WD/WN,  female HEENT: normocephalic, sclerae anicteric, TMs pearly, nares patent, no discharge or erythema, pharynx normal Oral cavity: MMM, no lesions Neck: supple, no lymphadenopathy, no thyromegaly, no masses Heart: RRR, normal S1, S2, no murmurs Lungs: CTA bilaterally, no wheezes, rhonchi, or rales Abdomen: +bs, soft, + mild epigatric tenderness no rebound, non distended, no masses, no hepatomegaly, no splenomegaly Musculoskeletal: nontender, no swelling, no obvious deformity Extremities: no edema, no cyanosis, no clubbing Pulses: 2+ symmetric, upper and lower extremities, normal cap  refill Neurological: alert, oriented x 3, CN2-12 intact, strength normal upper extremities and lower extremities, sensation normal throughout, DTRs 2+ throughout, no cerebellar signs, gait normal Psychiatric: normal affect, tearful when talking about anxiety.  Breast: defer Gyn: defer Rectal: defer   Medicare Attestation I have personally reviewed: The patient's medical and social history Their use of alcohol, tobacco or illicit drugs Their current medications and supplements The patient's functional ability including ADLs,fall risks, home safety risks, cognitive, and hearing and visual impairment Diet and physical activities Evidence for depression or mood disorders  The patient's weight, height, BMI, and visual acuity have been recorded in the chart.  I have made referrals, counseling, and provided education to the patient based on review of the above and I have provided the patient with a written personalized care plan for preventive services.     Vicie Mutters, PA-C  07/15/2016   Future Appointments Date Time Provider [place]  07/19/2016 3:30 PM Brittainy Erie Noe, Hershal Coria [facility]  07/21/2016 8:30 AM [id number]

## 2016-07-15 ENCOUNTER — Ambulatory Visit (INDEPENDENT_AMBULATORY_CARE_PROVIDER_SITE_OTHER): Payer: PPO | Admitting: Physician Assistant

## 2016-07-15 ENCOUNTER — Encounter: Payer: Self-pay | Admitting: Physician Assistant

## 2016-07-15 VITALS — BP 140/76 | HR 82 | Temp 97.7°F | Resp 14 | Ht 62.0 in | Wt 105.4 lb

## 2016-07-15 DIAGNOSIS — Z86010 Personal history of colonic polyps: Secondary | ICD-10-CM

## 2016-07-15 DIAGNOSIS — Z79899 Other long term (current) drug therapy: Secondary | ICD-10-CM | POA: Diagnosis not present

## 2016-07-15 DIAGNOSIS — R5383 Other fatigue: Secondary | ICD-10-CM | POA: Diagnosis not present

## 2016-07-15 DIAGNOSIS — K219 Gastro-esophageal reflux disease without esophagitis: Secondary | ICD-10-CM | POA: Diagnosis not present

## 2016-07-15 DIAGNOSIS — K5900 Constipation, unspecified: Secondary | ICD-10-CM | POA: Diagnosis not present

## 2016-07-15 DIAGNOSIS — Z0001 Encounter for general adult medical examination with abnormal findings: Secondary | ICD-10-CM

## 2016-07-15 DIAGNOSIS — E039 Hypothyroidism, unspecified: Secondary | ICD-10-CM

## 2016-07-15 DIAGNOSIS — R6889 Other general symptoms and signs: Secondary | ICD-10-CM | POA: Diagnosis not present

## 2016-07-15 DIAGNOSIS — R002 Palpitations: Secondary | ICD-10-CM | POA: Diagnosis not present

## 2016-07-15 DIAGNOSIS — Z853 Personal history of malignant neoplasm of breast: Secondary | ICD-10-CM

## 2016-07-15 DIAGNOSIS — E7800 Pure hypercholesterolemia, unspecified: Secondary | ICD-10-CM | POA: Diagnosis not present

## 2016-07-15 DIAGNOSIS — E559 Vitamin D deficiency, unspecified: Secondary | ICD-10-CM

## 2016-07-15 DIAGNOSIS — R42 Dizziness and giddiness: Secondary | ICD-10-CM

## 2016-07-15 DIAGNOSIS — R1310 Dysphagia, unspecified: Secondary | ICD-10-CM | POA: Diagnosis not present

## 2016-07-15 DIAGNOSIS — K5730 Diverticulosis of large intestine without perforation or abscess without bleeding: Secondary | ICD-10-CM

## 2016-07-15 DIAGNOSIS — R7303 Prediabetes: Secondary | ICD-10-CM

## 2016-07-15 DIAGNOSIS — Z0000 Encounter for general adult medical examination without abnormal findings: Secondary | ICD-10-CM

## 2016-07-15 LAB — HEPATIC FUNCTION PANEL
ALT: 16 U/L (ref 6–29)
AST: 21 U/L (ref 10–35)
Albumin: 4.5 g/dL (ref 3.6–5.1)
Alkaline Phosphatase: 102 U/L (ref 33–130)
BILIRUBIN DIRECT: 0.1 mg/dL (ref <=0.2)
BILIRUBIN INDIRECT: 0.3 mg/dL (ref 0.2–1.2)
BILIRUBIN TOTAL: 0.4 mg/dL (ref 0.2–1.2)
Total Protein: 6.9 g/dL (ref 6.1–8.1)

## 2016-07-15 LAB — BASIC METABOLIC PANEL WITH GFR
BUN: 11 mg/dL (ref 7–25)
CALCIUM: 9.9 mg/dL (ref 8.6–10.4)
CO2: 25 mmol/L (ref 20–31)
CREATININE: 0.74 mg/dL (ref 0.50–0.99)
Chloride: 102 mmol/L (ref 98–110)
GFR, EST NON AFRICAN AMERICAN: 83 mL/min (ref >=60)
Glucose, Bld: 73 mg/dL (ref 65–99)
Potassium: 4.3 mmol/L (ref 3.5–5.3)
SODIUM: 140 mmol/L (ref 135–146)

## 2016-07-15 LAB — CBC WITH DIFFERENTIAL/PLATELET
BASOS PCT: 1 %
Basophils Absolute: 60 cells/uL (ref 0–200)
EOS PCT: 0 %
Eosinophils Absolute: 0 cells/uL — ABNORMAL LOW (ref 15–500)
HEMATOCRIT: 39.1 % (ref 35.0–45.0)
HEMOGLOBIN: 13.5 g/dL (ref 11.7–15.5)
LYMPHS ABS: 1560 {cells}/uL (ref 850–3900)
Lymphocytes Relative: 26 %
MCH: 29.9 pg (ref 27.0–33.0)
MCHC: 34.5 g/dL (ref 32.0–36.0)
MCV: 86.5 fL (ref 80.0–100.0)
MONO ABS: 540 {cells}/uL (ref 200–950)
MPV: 9.3 fL (ref 7.5–12.5)
Monocytes Relative: 9 %
NEUTROS ABS: 3840 {cells}/uL (ref 1500–7800)
NEUTROS PCT: 64 %
Platelets: 324 10*3/uL (ref 140–400)
RBC: 4.52 MIL/uL (ref 3.80–5.10)
RDW: 13.9 % (ref 11.0–15.0)
WBC: 6 10*3/uL (ref 3.8–10.8)

## 2016-07-15 LAB — TSH: TSH: 1.38 m[IU]/L

## 2016-07-15 NOTE — Patient Instructions (Addendum)
[facility]  7 a.m.-6:30 p.m., Monday 7 a.m.-5 p.m., Tuesday-Friday Schedule an appointment by calling [phone].  [facility] Schedule an appointment by calling [phone]. Encourage you to get the 3D Mammogram  The 3D Mammogram is much more specific and sensitive to pick up breast cancer. For women with fibrocystic breast or lumpy breast it can be hard to determine if it is cancer or not but the 3D mammogram is able to tell this difference which cuts back on unneeded additional tests or scary call backs.   - over 40% increase in detection of breast cancer - over 40% reduction in false positives.  - fewer call backs - reduced anxiety - improved outcomes - [facility]   Fatty Liver Fatty liver is the accumulation of fat in liver cells. It is also called hepatosteatosis or steatohepatitis. It is normal for your liver to contain some fat. If fat is more than 5 to 10% of your liver's weight, you have fatty liver.  There are often no symptoms (problems) for years while damage is still occurring. People often learn about their fatty liver when they have medical tests for other reasons. Fat can damage your liver for years or even decades without causing problems. When it becomes severe, it can cause fatigue, weight loss, weakness, and confusion. This makes you more likely to develop more serious liver problems. The liver is the largest organ in the body. It does a lot of work and often gives no warning signs when it is sick until late in a disease. The liver has many important jobs including:  Breaking down foods.  Storing vitamins, iron, and other minerals.  Making proteins.  Making bile for food digestion.  Breaking down many products including medications, alcohol and some poisons.  PROGNOSIS  Fatty liver may cause no damage or it can lead to an inflammation of the liver. This is, called steatohepatitis.  Over time the liver may become  scarred and hardened. This condition is called cirrhosis. Cirrhosis is serious and may lead to liver failure or cancer. NASH is one of the leading causes of cirrhosis. About 10-20% of Americans have fatty liver and a smaller 2-5% has NASH.  TREATMENT   Weight loss, fat restriction, and exercise in overweight patients produces inconsistent results but is worth trying.  Good control of diabetes may reduce fatty liver.  Eat a balanced, healthy diet.  Increase your physical activity.  There are no medical or surgical treatments for a fatty liver or NASH, but improving your diet and increasing your exercise may help prevent or reverse some of the damage.

## 2016-07-16 ENCOUNTER — Other Ambulatory Visit: Payer: Self-pay | Admitting: Internal Medicine

## 2016-07-19 ENCOUNTER — Encounter: Payer: Self-pay | Admitting: Cardiology

## 2016-07-19 ENCOUNTER — Ambulatory Visit (INDEPENDENT_AMBULATORY_CARE_PROVIDER_SITE_OTHER): Payer: PPO | Admitting: Cardiology

## 2016-07-19 VITALS — BP 115/76 | HR 72 | Ht 62.0 in | Wt 106.0 lb

## 2016-07-19 DIAGNOSIS — R002 Palpitations: Secondary | ICD-10-CM

## 2016-07-19 NOTE — Patient Instructions (Signed)
Medication Instructions: No changes   Procedures/Testing: Your physician has recommended that you wear an event monitor for 7 days. Event monitors are medical devices that record the heart's electrical activity. Doctors most often [place] these monitors to diagnose arrhythmias. Arrhythmias are problems with the speed or rhythm of the heartbeat. The monitor is a small, portable device. You can wear one while you do your normal daily activities. This is usually used to diagnose what is causing palpitations/syncope (passing out).  Follow-Up: Your physician recommends that you schedule a follow-up appointment in: 10-14 days with a APP   If you need a refill on your cardiac medications before your next appointment, please call your pharmacy.

## 2016-07-19 NOTE — Progress Notes (Signed)
07/19/2016 Andrea Bates   [birth year]  [id number]  Primary Physician MCKEOWN,WILLIAM DAVID, MD Primary Cardiologist:  Dr. Stanford Breed   Reason for Visit/ CC: Palpitations    HPI:  Andrea Bates is a 71 y.o. female who is being seen today for the evaluation of palpitations. She is a former patient of Dr. Aundra Dubin. She is now followed by Dr. Stanford Breed. She has undergone w/u for CP and palpitations in the past. Cardiac catheterization February 2012 showed normal coronary arteries and normal LV function. Stress echocardiogram April 2015 normal. Echocardiogram 5/16 showed normal LV function. Monitor May 2017 showed sinus with occasional PACs, PVCs and short run of atrial tachycardia. She has been intolerant to beta blockers and calcium blockers. Also intolerant to statins.  She presents back to clinic with a complaint of worsening palpitations. This time, felt more near the lower left chest. Previous palpitations occurred higher up. Also more pronounced. Feels harder. Occurring more frequently. She denies any caffeine or ETOH. No recent fever, chills, n/v/d. She has hypothyroidism on hormone replacement but states that labs have been checked recently and WNL.   She is currently asymptomatic. EKG shows NSR. She apparently called the office the other day to notify Dr. Stanford Breed of her symptoms and he ordered for her to get an event monitor. She is scheduled to get this picked up on Wednesday of this week.    Current Meds  Medication Sig  . cholecalciferol (VITAMIN D) 1000 UNITS tablet Take 4,000 Units by mouth daily.   . Coenzyme Q10 (COQ10) 100 MG CAPS Take 100 mg by mouth 3 (three) times daily. Reported on 07/18/2015  . fish oil-omega-3 fatty acids 1000 MG capsule Take 2 g by mouth daily. Reported on 07/18/2015  . Flaxseed, Linseed, (FLAX SEED OIL) 1000 MG CAPS Take 1 capsule by mouth daily. Reported on 07/18/2015  . Lactobacillus (PROBIOTIC ACIDOPHILUS) TABS Take by mouth.  . LECITHIN PO Take 2  capsules by mouth 2 (two) times daily.  Marland Kitchen MAGNESIUM PO Take by mouth daily.  . mometasone (NASONEX) 50 MCG/ACT nasal spray Place 2 sprays into the nose daily.  Marland Kitchen SYNTHROID 50 MCG tablet TAKE 1 TABLET (50 MCG TOTAL) BY MOUTH DAILY BEFORE BREAKFAST.   Allergies  Allergen Reactions  . Armour Thyroid [Thyroid]     Palpitations   . Atenolol     presyncope  . Celexa [Citalopram Hydrobromide]     Severe chest tightness   . Diltiazem Hcl     Dropped bp  . Levaquin [Levofloxacin In D5w] Nausea And Vomiting  . Lipitor [Atorvastatin]     Myalgias   . Propranolol Hcl     Felt bad  . Vytorin [Ezetimibe-Simvastatin]     Myalgias    Past Medical History:  Diagnosis Date  . Chest pain    a. negative myoview 2011 [name]);  b. 04/2010 Cath: normal cors  . Colon polyps   . Diverticulosis   . GERD (gastroesophageal reflux disease)    a. mild esophagitis on egd 2012  . Hemorrhoids   . Hiatal hernia   . Hyperlipidemia   . Hyperlipidemia    Is now taking Fish oil. Had myalgias with Vytorin and Lipitor  . Hypothyroidism   . Irritable bowel syndrome   . Palpitations    a. 04/2008 echo: EF 50-60%;  b. 21 day event monitor 2012 - occasional sinus tachycardia with rates 110-120's. Rare PAC's/PVC's. Serveral short 4-5 beat runs of a-tach.  . Personal history of colonic polyps  a. colonoscopy 2009  . Personal history of malignant neoplasm of breast   . Pre-diabetes    Family History  Problem Relation Age of Onset  . Colon cancer      Paternal Wiliam Ke   . Diabetes Paternal Grandmother   . Heart defect Father    Past Surgical History:  Procedure Laterality Date  . BUNIONECTOMY    . CARPAL TUNNEL RELEASE     bilateral   . COLONOSCOPY    . NASAL SINUS SURGERY    . TONSILLECTOMY     Social History   Social History  . Marital status: Married    Spouse name: [name]  . Number of children: 2  . Years of education: N/A   Occupational History  . Retired     Tourist information centre manager   .  [employer]   Social History Main Topics  . Smoking status: Never Smoker  . Smokeless tobacco: Never Used  . Alcohol use No  . Drug use: No  . Sexual activity: Not on file   Other Topics Concern  . Not on file   Social History Narrative  . No narrative on file     Review of Systems: General: negative for chills, fever, night sweats or weight changes.  Cardiovascular: negative for chest pain, dyspnea on exertion, edema, orthopnea, palpitations, paroxysmal nocturnal dyspnea or shortness of breath Dermatological: negative for rash Respiratory: negative for cough or wheezing Urologic: negative for hematuria Abdominal: negative for nausea, vomiting, diarrhea, bright red blood per rectum, melena, or hematemesis Neurologic: negative for visual changes, syncope, or dizziness All other systems reviewed and are otherwise negative except as noted above.   Physical Exam:  Blood pressure 115/76, pulse 72, height 5\' 2"  (1.575 m), weight 106 lb (48.1 kg).  General appearance: alert, cooperative and no distress Neck: no carotid bruit and no JVD Lungs: clear to auscultation bilaterally Heart: regular rate and rhythm, S1, S2 normal, no murmur, click, rub or gallop Extremities: extremities normal, atraumatic, no cyanosis or edema Pulses: 2+ and symmetric Skin: Skin color, texture, turgor normal. No rashes or lesions Neurologic: Grossly normal  EKG NSR. 72 bpm -- personally reviewed   ASSESSMENT AND PLAN:   1. Palpitations: Intermittent in nature. Currently asymptomatic. EKG NSR. HR 72 bpm. Will continue with plans for outpatient cardiac monitor as ordered by Dr. Stanford Breed. 7 day event monitor. F/u with APP in 10-14 days. Further cardiac w/u and treatment to be determined once results return.    Brittainy Ladoris Gene, MHS [facility] 07/19/2016 4:32 PM

## 2016-07-20 ENCOUNTER — Ambulatory Visit: Payer: Self-pay | Admitting: Physician Assistant

## 2016-07-21 ENCOUNTER — Ambulatory Visit: Payer: Self-pay | Admitting: Physician Assistant

## 2016-07-21 ENCOUNTER — Ambulatory Visit (INDEPENDENT_AMBULATORY_CARE_PROVIDER_SITE_OTHER): Payer: PPO

## 2016-07-21 DIAGNOSIS — R002 Palpitations: Secondary | ICD-10-CM | POA: Diagnosis not present

## 2016-07-21 NOTE — Addendum Note (Signed)
Addended by: Leanord Asal T on: 07/21/2016 04:20 PM   Modules accepted: Orders

## 2016-07-28 ENCOUNTER — Telehealth: Payer: Self-pay | Admitting: Cardiology

## 2016-07-28 DIAGNOSIS — R002 Palpitations: Secondary | ICD-10-CM

## 2016-07-28 NOTE — Telephone Encounter (Signed)
Pt called for the monitor result please.

## 2016-07-28 NOTE — Telephone Encounter (Signed)
Spoke with pt, aware monitor not resulted yet, sent message to monitor tech to check on results.

## 2016-07-30 NOTE — Telephone Encounter (Signed)
New Message     Pt is feeling very dizzy and like her head is swimming  Would also like heart monitor results

## 2016-07-30 NOTE — Telephone Encounter (Signed)
Spoke with pt, aware of monitor results. She reports waking up every 2 hours at night with her heart racing, sweating and nausea. She also reports dizziness on a daily basis when walking around, lying down makes it better. She is also using afrin nasal spray at bedtime to help her breath. Patient made aware of the side effect of racing heart and dizziness. She would like to have an echo to make sure her ejection fraction is normal. Will forward for dr Stanford Breed review and okay to order echo.

## 2016-08-01 NOTE — Telephone Encounter (Signed)
Ok for echo Andrea Bates  

## 2016-08-02 ENCOUNTER — Ambulatory Visit: Payer: PPO | Admitting: Student

## 2016-08-02 NOTE — Progress Notes (Signed)
Cardiology Office Note    Date:  08/03/2016   ID:  Andrea Bates, DOB 11-05-1945, MRN [id number]  PCP:  Unk Pinto, MD  Cardiologist: Dr. Stanford Breed  Chief Complaint  Patient presents with  . Follow-up    discus event montior results     History of Present Illness:    Andrea Bates is a 71 y.o. female with past medical history of normal coronary arteries (by cath in 2012), HLD, and palpitations who presents to the office today for follow-up.   She was examined in the office by Lyda Jester, PA-C on 07/19/2016 and reported worsening palpitations. She denied any associated symptoms and her EKG showed no significant abnormalities. An event monitor was ordered which showed sinus rhythm with PAC's, PVC's, and brief episodes of PAT.   In talking with the patient today, she reports having frequent palpitations. Feels like the top part of her heart is beating irregularly as compared to the bottom part of it. She denies any associated chest discomfort, dizziness, or presyncope. Does report occasional dizziness with these episodes. Her symptoms are improved when she is walking on the treadmill. Sometimes relieved with belching.  She notes worsening nausea and vomiting over the past week and has an appointment with her Gastroenterologist scheduled for tomorrow. She feels like her cardiac symptoms have been exacerbated by this.   Past Medical History:  Diagnosis Date  . Chest pain    a. negative myoview 2011 [name]);  b. 04/2010 Cath: normal cors  . Colon polyps   . Diverticulosis   . GERD (gastroesophageal reflux disease)    a. mild esophagitis on egd 2012  . Hemorrhoids   . Hiatal hernia   . Hyperlipidemia   . Hyperlipidemia    Is now taking Fish oil. Had myalgias with Vytorin and Lipitor  . Hypothyroidism   . Irritable bowel syndrome   . Palpitations    a. 04/2008 echo: EF 50-60%;  b. 21 day event monitor 2012 - occasional sinus tachycardia with rates 110-120's.  Rare PAC's/PVC's. Serveral short 4-5 beat runs of a-tach. c. 06/2016: Event monitor showing PAC's, PVC's, and brief episodes of PAT.   Marland Kitchen Personal history of colonic polyps    a. colonoscopy 2009  . Personal history of malignant neoplasm of breast   . Pre-diabetes     Past Surgical History:  Procedure Laterality Date  . BUNIONECTOMY    . CARPAL TUNNEL RELEASE     bilateral   . COLONOSCOPY    . NASAL SINUS SURGERY    . TONSILLECTOMY      Current Medications: Outpatient Medications Prior to Visit  Medication Sig Dispense Refill  . cholecalciferol (VITAMIN D) 1000 UNITS tablet Take 4,000 Units by mouth daily.     . Coenzyme Q10 (COQ10) 100 MG CAPS Take 100 mg by mouth daily. Reported on 07/18/2015    . fish oil-omega-3 fatty acids 1000 MG capsule Take 2 g by mouth daily. Reported on 07/18/2015    . Flaxseed, Linseed, (FLAX SEED OIL) 1000 MG CAPS Take 1 capsule by mouth daily. Reported on 07/18/2015    . Lactobacillus (PROBIOTIC ACIDOPHILUS) TABS Take by mouth.    . LECITHIN PO Take 2 capsules by mouth 2 (two) times daily.    Marland Kitchen MAGNESIUM PO Take by mouth daily.    . mometasone (NASONEX) 50 MCG/ACT nasal spray Place 2 sprays into the nose daily.    Marland Kitchen SYNTHROID 50 MCG tablet TAKE 1 TABLET (50 MCG TOTAL) BY MOUTH DAILY  BEFORE BREAKFAST. 90 tablet 1   No facility-administered medications prior to visit.      Allergies:   Armour thyroid [thyroid]; Atenolol; Celexa [citalopram hydrobromide]; Diltiazem hcl; Levaquin [levofloxacin in d5w]; Lipitor [atorvastatin]; Propranolol hcl; and Vytorin [ezetimibe-simvastatin]   Social History   Social History  . Marital status: Married    Spouse name: [name]  . Number of children: 2  . Years of education: N/A   Occupational History  . Retired     Tourist information centre manager   .  [employer]   Social History Main Topics  . Smoking status: Never Smoker  . Smokeless tobacco: Never Used  . Alcohol use No  . Drug use: No  . Sexual activity: Not Asked    Other Topics Concern  . None   Social History Narrative  . None     Family History:  The patient's family history includes Diabetes in her paternal grandmother; Heart defect in her father.   Review of Systems:   Please see the history of present illness.     General:  No chills, fever, night sweats or weight changes.  Cardiovascular:  No chest pain, dyspnea on exertion, edema, orthopnea, paroxysmal nocturnal dyspnea. Positive for palpitations.  Dermatological: No rash, lesions/masses Respiratory: No cough, dyspnea Urologic: No hematuria, dysuria Abdominal:   No nausea, vomiting, diarrhea, bright red blood per rectum, melena, or hematemesis Neurologic:  No visual changes, wkns, changes in mental status. All other systems reviewed and are otherwise negative except as noted above.   Physical Exam:    VS:  BP 133/77   Pulse 73   Ht 5\' 2"  (1.575 m)   Wt 102 lb (46.3 kg)   BMI 18.66 kg/m    General: Well developed, well nourished Caucasian female appearing in no acute distress. Head: Normocephalic, atraumatic, sclera non-icteric, no xanthomas, nares are without discharge.  Neck: No carotid bruits. JVD not elevated.  Lungs: Respirations regular and unlabored, without wheezes or rales.  Heart: Regular rate and rhythm with occasional ectopic beats. No S3 or S4.  No murmur, no rubs, or gallops appreciated. Abdomen: Soft, non-tender, non-distended with normoactive bowel sounds. No hepatomegaly. No rebound/guarding. No obvious abdominal masses. Msk:  Strength and tone appear normal for age. No joint deformities or effusions. Extremities: No clubbing or cyanosis. No lower extremity edema.  Distal pedal pulses are 2+ bilaterally. Neuro: Alert and oriented X 3. Moves all extremities spontaneously. No focal deficits noted. Psych:  Responds to questions appropriately with a normal affect. Skin: No rashes or lesions noted  Wt Readings from Last 3 Encounters:  08/03/16 102 lb (46.3 kg)   07/19/16 106 lb (48.1 kg)  07/15/16 105 lb 6.4 oz (47.8 kg)     Studies/Labs Reviewed:   EKG:  EKG is not ordered today.  Recent Labs: 06/21/2016: Magnesium 2.4 07/15/2016: ALT 16; BUN 11; Creat 0.74; Hemoglobin 13.5; Platelets 324; Potassium 4.3; Sodium 140; TSH 1.38   Lipid Panel    Component Value Date/Time   CHOL 255 (H) 07/16/2015 0914   CHOL 276 (H) 06/10/2014 1242   TRIG 55 07/16/2015 0914   TRIG 61 06/10/2014 1242   HDL 79 07/16/2015 0914   HDL 80 06/10/2014 1242   CHOLHDL 3.2 07/16/2015 0914   VLDL 11 07/16/2015 0914   LDLCALC 165 (H) 07/16/2015 0914   LDLCALC 184 (H) 06/10/2014 1242   LDLDIRECT 180.1 02/01/2013 1608    Additional studies/ records that were reviewed today include:   Event Monitor: 07/21/2016 Sinus with  pacs, pvcs and brief PAT Kirk Ruths   Assessment:    1. Palpitations   2. PAT (paroxysmal atrial tachycardia) ([place])   3. Hyperlipidemia, unspecified hyperlipidemia type   4. Nausea and vomiting, intractability of vomiting not specified, unspecified vomiting type      Plan:   In order of problems listed above:  1. Palpitations/ PAT - she has an extended history of palpitations with PAC's and brief runs of PAT being noted in the past. She was examined in 06/2016 and reported worsening symptoms, therefore an event monitor was ordered. - her event monitor showed sinus rhythm with PAC's, PVC's, and brief episodes of PAT. Scheduled for an echocardiogram later this month.  - recent labs showed normal electrolytes and thyroid function.  - She has continued to experience palpitations since her last visit but upon learning the findings of her event monitor are benign, she does not wish to pursue medical therapy for her palpitations. She was intolerant to Cardizem, Atenolol, and Propranolol in the past. Will provide with PRN Lopressor 12.5mg  to take as needed for persistent palpitations.   2. HLD - followed by PCP. - intolerant to statin  therapy. - remains on CoQ10 and Fish Oil.  3. Nausea and Vomiting - reports symptoms have occurred over the past week. No changes in medication or diet. - has follow-up with Gastroenterology scheduled for tomorrow.   Medication Adjustments/Labs and Tests Ordered: Current medicines are reviewed at length with the patient today.  Concerns regarding medicines are outlined above.  Medication changes, Labs and Tests ordered today are listed in the Patient Instructions below. Patient Instructions  Medication Instructions:  START Lopressor 12.5mg  Take 1 tab by mouth twice a day as needed (Tablet will come in 25mg , you will need to cut tablet in half to take 12.5mg )  Labwork: None   Testing/Procedures: None   Follow-Up: Your physician wants you to follow-up in: 12 months with Dr Stanford Breed. You will receive a reminder letter in the mail two months in advance. If you don't receive a letter, please call our office to schedule the follow-up appointment.  Any Other Special Instructions Will Be Listed Below (If Applicable).  If you need a refill on your cardiac medications before your next appointment, please call your pharmacy.   Signed, Erma Heritage, PA-C  08/03/2016 4:20 PM    [facility] [place], [place], [place]  [address] Phone: [phone]; Fax: [phone]  [address] [place], [place] [address] Phone: [phone]

## 2016-08-02 NOTE — Telephone Encounter (Signed)
Spoke with pt, Aware of dr Jacalyn Lefevre recommendations. Echo scheduled 08/12/16.

## 2016-08-03 ENCOUNTER — Ambulatory Visit (INDEPENDENT_AMBULATORY_CARE_PROVIDER_SITE_OTHER): Payer: PPO | Admitting: Student

## 2016-08-03 ENCOUNTER — Encounter: Payer: Self-pay | Admitting: Student

## 2016-08-03 VITALS — BP 133/77 | HR 73 | Ht 62.0 in | Wt 102.0 lb

## 2016-08-03 DIAGNOSIS — E785 Hyperlipidemia, unspecified: Secondary | ICD-10-CM

## 2016-08-03 DIAGNOSIS — R112 Nausea with vomiting, unspecified: Secondary | ICD-10-CM

## 2016-08-03 DIAGNOSIS — I471 Supraventricular tachycardia: Secondary | ICD-10-CM

## 2016-08-03 DIAGNOSIS — R002 Palpitations: Secondary | ICD-10-CM

## 2016-08-03 MED ORDER — METOPROLOL TARTRATE 25 MG PO TABS: 12.5000 mg | ORAL_TABLET | Freq: Two times a day (BID) | ORAL | 4 refills | Status: DC | PRN

## 2016-08-03 NOTE — Patient Instructions (Addendum)
Medication Instructions:  START Lopressor 12.5mg  Take 1 tab by mouth twice a day as needed (Taklet will come in 25mg , you will need to cut tablet in half to take 12.5mg )  Labwork: None   Testing/Procedures: None   Follow-Up: Your physician wants you to follow-up in: 12 months with Dr Stanford Breed. You will receive a reminder letter in the mail two months in advance. If you don't receive a letter, please call our office to schedule the follow-up appointment.  Any Other Special Instructions Will Be Listed Below (If Applicable).   If you need a refill on your cardiac medications before your next appointment, please call your pharmacy.

## 2016-08-04 ENCOUNTER — Encounter: Payer: Self-pay | Admitting: Internal Medicine

## 2016-08-04 ENCOUNTER — Ambulatory Visit (INDEPENDENT_AMBULATORY_CARE_PROVIDER_SITE_OTHER): Payer: PPO | Admitting: Internal Medicine

## 2016-08-04 VITALS — BP 114/68 | HR 80 | Ht 61.75 in | Wt 103.0 lb

## 2016-08-04 DIAGNOSIS — K219 Gastro-esophageal reflux disease without esophagitis: Secondary | ICD-10-CM

## 2016-08-04 DIAGNOSIS — R110 Nausea: Secondary | ICD-10-CM

## 2016-08-04 DIAGNOSIS — R140 Abdominal distension (gaseous): Secondary | ICD-10-CM

## 2016-08-04 DIAGNOSIS — R109 Unspecified abdominal pain: Secondary | ICD-10-CM

## 2016-08-04 NOTE — Patient Instructions (Signed)
You have been scheduled for an endoscopy. Please follow written instructions given to you at your visit today. If you use inhalers (even only as needed), please bring them with you on the day of your procedure.  You have been scheduled for a gastric emptying scan at [facility] on 08/18/2016 at 7:00am. Please arrive at least 15 minutes prior to your appointment for registration. Please make certain not to have anything to eat or drink after midnight the night before your test. Hold all stomach medications (ex: Zofran, phenergan, Reglan) 48 hours prior to your test. If you need to reschedule your appointment, please contact radiology scheduling at [phone]. _____________________________________________________________________ A gastric-emptying study measures how long it takes for food to move through your stomach. There are several ways to measure stomach emptying. In the most common test, you eat food that contains a small amount of radioactive material. A scanner that detects the movement of the radioactive material is placed over your abdomen to monitor the rate at which food leaves your stomach. This test normally takes about 4 hours to complete. _____________________________________________________________________

## 2016-08-04 NOTE — Progress Notes (Signed)
HISTORY OF PRESENT ILLNESS:  Andrea Bates is a 71 y.o. female with past medical history as listed below who had a history of chronic somatic complaints who presents with multiple complaints. She is accompanied by her husband [name]. She has been evaluated on multiple occasions for multiple chronic complaints. I last saw her 08/19/2015. Complains included epigastric discomfort relieved with belching, increased intestinal gas, intermittent left upper quadrant pain, globus sensation, irregular bowel habits, and palpitations. The impression at that time was that the complaints were functional in nature. She was accepting of this notion, as she has been for years. Screening for celiac disease was negative. CT scan of the abdomen and pelvis was negative. She was prescribed pantoprazole for GERD. Advised that she resume Zoloft therapy which she did not. She presents today with ongoing chronic bloating. She feels like she needs to belch. Worse over the past 2-3 weeks. She also describes upper abdominal discomfort. Only exacerbating factor seems to be meals. No relieving factors. He describes her bowels constipated though she has a daily movement. As was the case last her, she is anticipating a cruise with her family and is concerned that she will be suffering with abdominal complaints. She denies weight loss, fevers, or bleeding.  REVIEW OF SYSTEMS:  All non-GI ROS negative except for fatigue, short of breath, allergies  Past Medical History:  Diagnosis Date  . Chest pain    a. negative myoview 2011 [name]);  b. 04/2010 Cath: normal cors  . Colon polyps   . Diverticulosis   . GERD (gastroesophageal reflux disease)    a. mild esophagitis on egd 2012  . Hemorrhoids   . Hiatal hernia   . Hyperlipidemia   . Hyperlipidemia    Is now taking Fish oil. Had myalgias with Vytorin and Lipitor  . Hypothyroidism   . Irritable bowel syndrome   . Palpitations    a. 04/2008 echo: EF 50-60%;  b. 21 day event  monitor 2012 - occasional sinus tachycardia with rates 110-120's. Rare PAC's/PVC's. Serveral short 4-5 beat runs of a-tach. c. 06/2016: Event monitor showing PAC's, PVC's, and brief episodes of PAT.   Marland Kitchen Personal history of colonic polyps    a. colonoscopy 2009  . Personal history of malignant neoplasm of breast   . Pre-diabetes     Past Surgical History:  Procedure Laterality Date  . BUNIONECTOMY    . CARPAL TUNNEL RELEASE     bilateral   . COLONOSCOPY    . NASAL SINUS SURGERY    . TONSILLECTOMY      Social History [name]  reports that she has never smoked. She has never used smokeless tobacco. She reports that she does not drink alcohol or use drugs.  family history includes Diabetes in her paternal grandmother; Heart defect in her father.  Allergies  Allergen Reactions  . Armour Thyroid [Thyroid]     Palpitations   . Atenolol     presyncope  . Celexa [Citalopram Hydrobromide]     Severe chest tightness   . Diltiazem Hcl     Dropped bp  . Levaquin [Levofloxacin In D5w] Nausea And Vomiting  . Lipitor [Atorvastatin]     Myalgias   . Propranolol Hcl     Felt bad  . Vytorin [Ezetimibe-Simvastatin]     Myalgias        PHYSICAL EXAMINATION: Vital signs: BP 114/68 (BP Location: Left Arm, Patient Position: Sitting, Cuff Size: Normal)   Pulse 80   Ht 5' 1.75" (1.568  m) Comment: height measured without shoes  Wt 103 lb (46.7 kg)   BMI 18.99 kg/m   Constitutional: generally well-appearing, no acute distress Psychiatric: alert and oriented x3, cooperative. Anxious Eyes: extraocular movements intact, anicteric, conjunctiva pink Mouth: oral pharynx moist, no lesions Neck: supple no lymphadenopathy Cardiovascular: heart regular rate and rhythm, no murmur Lungs: clear to auscultation bilaterally Abdomen: soft, nontender, nondistended, no obvious ascites, no peritoneal signs, normal bowel sounds, no organomegaly Rectal: Omitted Extremities: no lower extremity  edema bilaterally Skin: no lesions on visible extremities Neuro: No focal deficits. No asterixis.    ASSESSMENT:  #1. Multiple GI complaints. Again felt to be functional #2. Bloating. Rule out gastroparesis #3. Abdominal pain rule; out upper GI mucosal lesion #4. History of GERD. On PPI   PLAN:  #1. Solid-phase gastric empty scan #2. Diagnostic upper endoscopy #3. Educational literature on abdominal bloating and gas with associated dietary restrictions #4. Continue PPI #5. Consider trial of antibiotic for bloating if he above negative as she may have an element of bacterial overgrowth

## 2016-08-09 ENCOUNTER — Encounter: Payer: Self-pay | Admitting: Internal Medicine

## 2016-08-09 ENCOUNTER — Ambulatory Visit (AMBULATORY_SURGERY_CENTER): Payer: PPO | Admitting: Internal Medicine

## 2016-08-09 VITALS — BP 110/70 | HR 71 | Temp 99.6°F | Resp 11 | Ht 61.0 in | Wt 103.0 lb

## 2016-08-09 DIAGNOSIS — R1084 Generalized abdominal pain: Secondary | ICD-10-CM | POA: Diagnosis not present

## 2016-08-09 DIAGNOSIS — R109 Unspecified abdominal pain: Secondary | ICD-10-CM | POA: Diagnosis not present

## 2016-08-09 DIAGNOSIS — K219 Gastro-esophageal reflux disease without esophagitis: Secondary | ICD-10-CM | POA: Diagnosis not present

## 2016-08-09 DIAGNOSIS — I10 Essential (primary) hypertension: Secondary | ICD-10-CM | POA: Diagnosis not present

## 2016-08-09 DIAGNOSIS — K449 Diaphragmatic hernia without obstruction or gangrene: Secondary | ICD-10-CM | POA: Diagnosis not present

## 2016-08-09 HISTORY — PX: UPPER GASTROINTESTINAL ENDOSCOPY: SHX188

## 2016-08-09 MED ORDER — SODIUM CHLORIDE 0.9 % IV SOLN: 500.0000 mL | INTRAVENOUS | Status: DC

## 2016-08-09 NOTE — Op Note (Signed)
[facility] Patient Name: Andrea Bates Procedure Date: 08/09/2016 10:25 AM MRN: [id number] Endoscopist: Docia Chuck. Henrene Pastor , MD Age: 71 Referring MD:  Date of Birth: Mar 20, 1946 Gender: Female Account #: [id number] Procedure:                Upper GI endoscopy Indications:              Abdominal pain Medicines:                Monitored Anesthesia Care Procedure:                Pre-Anesthesia Assessment:                           - Prior to the procedure, a History and Physical                            was performed, and patient medications and                            allergies were reviewed. The patient's tolerance of                            previous anesthesia was also reviewed. The risks                            and benefits of the procedure and the sedation                            options and risks were discussed with the patient.                            All questions were answered, and informed consent                            was obtained. Prior Anticoagulants: The patient has                            taken no previous anticoagulant or antiplatelet                            agents. ASA Grade Assessment: II - A patient with                            mild systemic disease. After reviewing the risks                            and benefits, the patient was deemed in                            satisfactory condition to undergo the procedure.                           After obtaining informed consent, the endoscope was  passed under direct vision. Throughout the                            procedure, the patient's blood pressure, pulse, and                            oxygen saturations were monitored continuously. The                            Endoscope was introduced through the mouth, and                            advanced to the second part of duodenum. The upper                            GI endoscopy was accomplished without  difficulty.                            The patient tolerated the procedure well. Scope In: Scope Out: Findings:                 The esophagus was normal.                           The stomach was normal.                           The examined duodenum was normal.                           The cardia and gastric fundus were normal on                            retroflexion. Complications:            No immediate complications. Estimated Blood Loss:     Estimated blood loss: none. Impression:               - Normal esophagus.                           - Normal stomach.                           - Normal examined duodenum.                           - No specimens collected. Recommendation:           - Patient has a contact number available for                            emergencies. The signs and symptoms of potential                            delayed complications were discussed with the  patient. Return to normal activities tomorrow.                            Written discharge instructions were provided to the                            patient.                           - Resume previous diet.                           - Continue present medications.                           - Keep plans for gastric emptying scan as already                            scheduled John N. Henrene Pastor, MD 08/09/2016 10:35:07 AM This report has been signed electronically.

## 2016-08-09 NOTE — Patient Instructions (Signed)
YOU HAD AN ENDOSCOPIC PROCEDURE TODAY AT [facility]:   Refer to the procedure report that was given to you for any specific questions about what was found during the examination.  If the procedure report does not answer your questions, please call your gastroenterologist to clarify.  If you requested that your care partner not be given the details of your procedure findings, then the procedure report has been included in a sealed envelope for you to review at your convenience later.  YOU SHOULD EXPECT: Some feelings of bloating in the abdomen. Passage of more gas than usual.  Walking can help get rid of the air that was put into your GI tract during the procedure and reduce the bloating. If you had a lower endoscopy (such as a colonoscopy or flexible sigmoidoscopy) you may notice spotting of blood in your stool or on the toilet paper. If you underwent a bowel prep for your procedure, you may not have a normal bowel movement for a few days.  Please Note:  You might notice some irritation and congestion in your nose or some drainage.  This is from the oxygen used during your procedure.  There is no need for concern and it should clear up in a day or so.  SYMPTOMS TO REPORT IMMEDIATELY:    Following upper endoscopy (EGD)  Vomiting of blood or coffee ground material  New chest pain or pain under the shoulder blades  Painful or persistently difficult swallowing  New shortness of breath  Fever of 100F or higher  Black, tarry-looking stools  For urgent or emergent issues, a gastroenterologist can be reached at any hour by calling [phone].   DIET:  We do recommend a small meal at first, but then you may proceed to your regular diet.  Drink plenty of fluids but you should avoid alcoholic beverages for 24 hours.  ACTIVITY:  You should plan to take it easy for the rest of today and you should NOT DRIVE or use heavy machinery until tomorrow (because of the sedation medicines used  during the test).    FOLLOW UP: Our staff will call the number listed on your records the next business day following your procedure to check on you and address any questions or concerns that you may have regarding the information given to you following your procedure. If we do not reach you, we will leave a message.  However, if you are feeling well and you are not experiencing any problems, there is no need to return our call.  We will assume that you have returned to your regular daily activities without incident.  If any biopsies were taken you will be contacted by phone or by letter within the next 1-3 weeks.  Please call us at [phone] if you have not heard about the biopsies in 3 weeks.   Keep plans for gastric emptying scan as already schedule   SIGNATURES/CONFIDENTIALITY: You and/or your care partner have signed paperwork which will be entered into your electronic medical record.  These signatures attest to the fact that that the information above on your After Visit Summary has been reviewed and is understood.  Full responsibility of the confidentiality of this discharge information lies with you and/or your care-partner.

## 2016-08-09 NOTE — Progress Notes (Signed)
Pt's states no medical or surgical changes since previsit or office visit. 

## 2016-08-09 NOTE — Progress Notes (Signed)
Report to PACU, RN, vss, BBS= Clear.  

## 2016-08-10 ENCOUNTER — Telehealth: Payer: Self-pay

## 2016-08-10 NOTE — Telephone Encounter (Signed)
  Follow up Call-  Call back number 08/09/2016  Post procedure Call Back phone  # [phone]  Permission to leave phone message Yes  Some recent data might be hidden     Patient questions:  Do you have a fever, pain , or abdominal swelling? No. Pain Score  0 *  Have you tolerated food without any problems? Yes.    Have you been able to return to your normal activities? Yes.    Do you have any questions about your discharge instructions: Diet   No. Medications  No. Follow up visit  No.  Do you have questions or concerns about your Care? No.  Actions: * If pain score is 4 or above: No action needed, pain <4.

## 2016-08-12 ENCOUNTER — Other Ambulatory Visit (HOSPITAL_COMMUNITY): Payer: PPO

## 2016-08-13 ENCOUNTER — Other Ambulatory Visit: Payer: Self-pay

## 2016-08-13 ENCOUNTER — Ambulatory Visit (HOSPITAL_COMMUNITY): Payer: PPO | Attending: Cardiology

## 2016-08-13 DIAGNOSIS — R002 Palpitations: Secondary | ICD-10-CM | POA: Insufficient documentation

## 2016-08-18 ENCOUNTER — Other Ambulatory Visit: Payer: Self-pay

## 2016-08-18 ENCOUNTER — Ambulatory Visit (HOSPITAL_COMMUNITY)
Admission: RE | Admit: 2016-08-18 | Discharge: 2016-08-18 | Disposition: A | Discharge status: Home / Self Care | Payer: PPO | Source: Ambulatory Visit | Attending: Internal Medicine | Admitting: Internal Medicine

## 2016-08-18 DIAGNOSIS — R110 Nausea: Secondary | ICD-10-CM

## 2016-08-18 DIAGNOSIS — R140 Abdominal distension (gaseous): Secondary | ICD-10-CM | POA: Diagnosis not present

## 2016-08-18 DIAGNOSIS — R1013 Epigastric pain: Secondary | ICD-10-CM | POA: Diagnosis not present

## 2016-08-18 DIAGNOSIS — R109 Unspecified abdominal pain: Secondary | ICD-10-CM

## 2016-08-18 MED ORDER — TECHNETIUM TC 99M SULFUR COLLOID
2.1000 | Freq: Once | INTRAVENOUS | Status: AC | PRN
  Administered 2016-08-18: 2.1 via INTRAVENOUS

## 2016-08-20 ENCOUNTER — Telehealth: Payer: Self-pay | Admitting: Internal Medicine

## 2016-08-20 NOTE — Telephone Encounter (Signed)
Discussed with pt her GES results and her questions were answered.

## 2016-08-25 ENCOUNTER — Other Ambulatory Visit (HOSPITAL_COMMUNITY): Payer: PPO

## 2016-08-25 ENCOUNTER — Ambulatory Visit (HOSPITAL_COMMUNITY)
Admission: RE | Admit: 2016-08-25 | Discharge: 2016-08-25 | Disposition: A | Discharge status: Home / Self Care | Payer: PPO | Source: Ambulatory Visit | Attending: Internal Medicine | Admitting: Internal Medicine

## 2016-08-25 DIAGNOSIS — R109 Unspecified abdominal pain: Secondary | ICD-10-CM | POA: Diagnosis not present

## 2016-08-25 DIAGNOSIS — K824 Cholesterolosis of gallbladder: Secondary | ICD-10-CM | POA: Insufficient documentation

## 2016-09-01 DIAGNOSIS — M545 Low back pain: Secondary | ICD-10-CM | POA: Diagnosis not present

## 2016-09-07 DIAGNOSIS — M545 Low back pain: Secondary | ICD-10-CM | POA: Diagnosis not present

## 2016-09-23 ENCOUNTER — Telehealth: Payer: Self-pay | Admitting: Internal Medicine

## 2016-09-23 NOTE — Telephone Encounter (Signed)
States she had to lay on her stomach to have some of the images taken on her [place]. Pt is requesting to have a colonoscopy done. Please advise.

## 2016-09-23 NOTE — Telephone Encounter (Signed)
Pt states she is having issues with lots of gas that is stuck in her abdomen. States it is painful and she feels like her food is sticking in her stomach. Reports her bowels are not moving like they used to and she has issues with constipation. States taking gas-x every day of her life is getting old. Wanted me to let Dr. Henrene Pastor know that when she had the US done in June the tech has trouble seeing everything due to the gas, states she had to lay on her stomach

## 2016-09-24 NOTE — Telephone Encounter (Signed)
Spoke with pt and she is aware.

## 2016-09-24 NOTE — Telephone Encounter (Signed)
Intestinal gas is normal. Seeing that on ultrasound has no clinical meaningful other than it may obscure ultrasound images. That is why they moved her, to get adequate views (which they did). No indication for colonoscopy. She is up-to-date. I'm sorry that she feels bad, but I have no new recommendations. I still think that she needs to see her PCP and be treated appropriately for anxiety. I have mentioned this on multiple occasions

## 2016-09-27 DIAGNOSIS — M545 Low back pain: Secondary | ICD-10-CM | POA: Diagnosis not present

## 2016-09-30 DIAGNOSIS — M545 Low back pain: Secondary | ICD-10-CM | POA: Diagnosis not present

## 2016-10-01 DIAGNOSIS — S32010A Wedge compression fracture of first lumbar vertebra, initial encounter for closed fracture: Secondary | ICD-10-CM | POA: Diagnosis not present

## 2016-11-01 ENCOUNTER — Other Ambulatory Visit: Payer: Self-pay | Admitting: Physician Assistant

## 2016-11-03 DIAGNOSIS — S32010A Wedge compression fracture of first lumbar vertebra, initial encounter for closed fracture: Secondary | ICD-10-CM | POA: Diagnosis not present

## 2016-12-20 DIAGNOSIS — S32010A Wedge compression fracture of first lumbar vertebra, initial encounter for closed fracture: Secondary | ICD-10-CM | POA: Diagnosis not present

## 2017-01-12 ENCOUNTER — Encounter: Payer: Self-pay | Admitting: Physician Assistant

## 2017-01-18 DIAGNOSIS — H1013 Acute atopic conjunctivitis, bilateral: Secondary | ICD-10-CM | POA: Diagnosis not present

## 2017-02-15 DIAGNOSIS — H2513 Age-related nuclear cataract, bilateral: Secondary | ICD-10-CM | POA: Diagnosis not present

## 2017-02-15 DIAGNOSIS — H35371 Puckering of macula, right eye: Secondary | ICD-10-CM | POA: Diagnosis not present

## 2017-02-15 DIAGNOSIS — H5203 Hypermetropia, bilateral: Secondary | ICD-10-CM | POA: Diagnosis not present

## 2017-04-04 ENCOUNTER — Telehealth: Payer: Self-pay | Admitting: Cardiology

## 2017-04-04 NOTE — Telephone Encounter (Signed)
New message  Pt verbalized that she is calling for RN  Pt verbalized that she is very tired

## 2017-04-04 NOTE — Telephone Encounter (Signed)
Returned the call to the patient. She stated that she feels like her palpations  may be getting worse lately. She is fatigued but states that she is sleeping well. She does feel her palpitations more at night when she is lying down. She denies chest pain or shortness of breath. She stated that her blood pressure has been running in the 120/80's. Her heart rate runs anywhere from the high 50's to high 60's. She would like a follow up appointment. One has been made for 1/17 at 11:30. She will call back if she needs anything further.

## 2017-04-07 ENCOUNTER — Ambulatory Visit: Payer: PPO | Admitting: Adult Health

## 2017-04-07 ENCOUNTER — Encounter: Payer: Self-pay | Admitting: Adult Health

## 2017-04-07 VITALS — BP 120/68 | HR 73 | Ht 62.0 in | Wt 107.0 lb

## 2017-04-07 DIAGNOSIS — R109 Unspecified abdominal pain: Secondary | ICD-10-CM | POA: Diagnosis not present

## 2017-04-07 DIAGNOSIS — R5383 Other fatigue: Secondary | ICD-10-CM

## 2017-04-07 DIAGNOSIS — R002 Palpitations: Secondary | ICD-10-CM | POA: Diagnosis not present

## 2017-04-07 MED ORDER — MAGNESIUM 200 MG PO TABS: 200.0000 mg | ORAL_TABLET | Freq: Two times a day (BID) | ORAL | 5 refills | Status: DC

## 2017-04-07 NOTE — Progress Notes (Signed)
Cardiology Office Note   Date:  04/07/2017   ID:  Andrea Bates, DOB December 15, 1945, MRN [id number]  PCP:  Unk Pinto, MD  Cardiologist:  Dr. Stanford Breed  Chief Complaint  Patient presents with  . Follow-up  . Fatigue  . Palpitations  . Chest Pain    Feels like pins.  . Shortness of Breath     History of Present Illness: Andrea Bates is a 72 y.o. female who presents for ongoing assessment and management of palpitations.  The patient comes today with a lengthy list of somatic complaints.  Patient's main complaint is GI in etiology with frequent gas, abdominal bloating, spasming, and worries that her aorta is "abnormal" causing her to feel pain in the middle of her stomach.  Patient has been seen by GI and has had multiple testing to include EDG and colonoscopy, ultrasounds of the abdomen, and CT scan of the abdomen.  There was no abdominal aortic aneurysm, dilatation, on testing.  Patient had a normal cardiac catheterization in 2012.  She speaks at length about abdominal pain, bubbling, and spasming, causing her heart to have palpitations, especially when she lays down at night keeping her awake.  I reviewed her Holter monitor which revealed episodes of PACs, PVCs, and some atrial tachycardia which was brief.  She had been placed on a metoprolol but she decided not to take it as she was afraid it would make her blood pressure too low.   Past Medical History:  Diagnosis Date  . Chest pain    a. negative myoview 2011 [name]);  b. 04/2010 Cath: normal cors  . Colon polyps   . Diverticulosis   . GERD (gastroesophageal reflux disease)    a. mild esophagitis on egd 2012  . Hemorrhoids   . Hiatal hernia   . Hyperlipidemia   . Hyperlipidemia    Is now taking Fish oil. Had myalgias with Vytorin and Lipitor  . Hypothyroidism   . Irritable bowel syndrome   . Palpitations    a. 04/2008 echo: EF 50-60%;  b. 21 day event monitor 2012 - occasional sinus tachycardia with rates  110-120's. Rare PAC's/PVC's. Serveral short 4-5 beat runs of a-tach. c. 06/2016: Event monitor showing PAC's, PVC's, and brief episodes of PAT.   Marland Kitchen Personal history of colonic polyps    a. colonoscopy 2009  . Personal history of malignant neoplasm of breast   . Pre-diabetes     Past Surgical History:  Procedure Laterality Date  . BUNIONECTOMY    . CARPAL TUNNEL RELEASE     bilateral   . COLONOSCOPY    . NASAL SINUS SURGERY    . TONSILLECTOMY       Current Outpatient Medications  Medication Sig Dispense Refill  . cholecalciferol (VITAMIN D) 1000 UNITS tablet Take 4,000 Units by mouth daily.     . Coenzyme Q10 (COQ10) 100 MG CAPS Take 100 mg by mouth daily. Reported on 07/18/2015    . Digestive Enzymes (DIGESTIVE ENZYME ULTRA PO) Take 1 tablet by mouth daily.    . Esomeprazole Magnesium (NEXIUM 24HR PO) Take 1 tablet by mouth daily.    . fish oil-omega-3 fatty acids 1000 MG capsule Take 2 g by mouth daily. Reported on 07/18/2015    . Lactobacillus (PROBIOTIC ACIDOPHILUS) TABS Take by mouth.    . mometasone (NASONEX) 50 MCG/ACT nasal spray Place 2 sprays into the nose daily.    Marland Kitchen SYNTHROID 50 MCG tablet TAKE 1 TABLET (50 MCG TOTAL) BY MOUTH DAILY BEFORE  BREAKFAST. 90 tablet 1  . Magnesium 200 MG TABS Take 1 tablet (200 mg total) by mouth 2 (two) times daily. 60 each 5   Current Facility-Administered Medications  Medication Dose Route Frequency Provider Last Rate Last Dose  . 0.9 %  sodium chloride infusion  500 mL Intravenous Continuous Irene Shipper, MD        Allergies:   Armour thyroid [thyroid]; Atenolol; Celexa [citalopram hydrobromide]; Diltiazem hcl; Levaquin [levofloxacin in d5w]; Lipitor [atorvastatin]; Propranolol hcl; and Vytorin [ezetimibe-simvastatin]    Social History:  The patient  reports that  has never smoked. she has never used smokeless tobacco. She reports that she does not drink alcohol or use drugs.   Family History:  The patient's family history includes  Colon cancer in her unknown relative; Diabetes in her paternal grandmother; Heart defect in her father.    ROS: All other systems are reviewed and negative. Unless otherwise mentioned in H&P    PHYSICAL EXAM: VS:  BP 120/68   Pulse 73   Ht 5\' 2"  (1.575 m)   Wt 107 lb (48.5 kg)   BMI 19.57 kg/m  , BMI Body mass index is 19.57 kg/m. GEN: Well nourished, well developed, in no acute distress  HEENT: normal  Neck: no JVD, carotid bruits, or masses Cardiac: RRR; no murmurs, rubs, or gallops,no edema  Respiratory:  clear to auscultation bilaterally, normal work of breathing GI: soft, nontender, nondistended, + BS, hyperactive bowel sounds MS: no deformity or atrophy  Skin: warm and dry, no rash Neuro:  Strength and sensation are intact Psych: euthymic mood, full affect   EKG: Normal sinus rhythm, heart rate of 73 bpm.  No ST-T wave abnormalities.  Recent Labs: 06/21/2016: Magnesium 2.4 07/15/2016: ALT 16; BUN 11; Creat 0.74; Hemoglobin 13.5; Platelets 324; Potassium 4.3; Sodium 140; TSH 1.38    Lipid Panel    Component Value Date/Time   CHOL 255 (H) 07/16/2015 0914   CHOL 276 (H) 06/10/2014 1242   TRIG 55 07/16/2015 0914   TRIG 61 06/10/2014 1242   HDL 79 07/16/2015 0914   HDL 80 06/10/2014 1242   CHOLHDL 3.2 07/16/2015 0914   VLDL 11 07/16/2015 0914   LDLCALC 165 (H) 07/16/2015 0914   LDLCALC 184 (H) 06/10/2014 1242   LDLDIRECT 180.1 02/01/2013 1608      Wt Readings from Last 3 Encounters:  04/07/17 107 lb (48.5 kg)  08/09/16 103 lb (46.7 kg)  08/04/16 103 lb (46.7 kg)      ASSESSMENT AND PLAN:  1.  Palpitations: The patient is very anxious, multiple complaints, is very sensitive to any abnormalities within her body to include extra heartbeats.  I have gone over her diet and she is drinking a good bit of caffeine to include coffee and iced tea throughout the day.  I have advised her to try and go to decaffeinated to assist with palpitations which she would most  notably be aware of at night when she is in bed.  Of also starting her on magnesium 200 mg p.o. twice daily as she is taking 2 PPIs and H2 blocker.  This is been the cause hypomagnesemia.  The patient has been on over-the-counter magnesium that she is unaware of the dosing.  Try supplementation to see if this would be helpful.  I reviewed all of her labs, cardiac catheterization, and her EKG with her giving her reassurance concerning her cardiac status.  No further cardiac testing is planned.  2.  Abdominal pain with frequent  flatus and gurgling: She is unhappy with her current GI physician and is looking for another GI evaluation.  I advised her to seek a second opinion if she feels it would be helpful to her.   Current medicines are reviewed at length with the patient today.  I have spent greater than 30 minutes with this patient listing to her multiple somatic complaints examination and explaining her most recent cardiac testing and results, also giving reassurance.  No further cardiac workup is required  Labs/ tests ordered today include: None  Phill Myron. West Pugh, ANP, AACC   04/07/2017 12:53 PM    [facility] [address] [place], [place] [address] Phone: [phone]; Fax: [phone]

## 2017-04-07 NOTE — Patient Instructions (Signed)
Medication Instructions:  STOP MAGNESIUM THAT YOU HAVE AT HOME  START MAGNESIUM 200MG  TWICE DAILY  If you need a refill on your cardiac medications before your next appointment, please call your pharmacy.  Special Instructions:  STOP CAFFEINE INTAKE  Follow-Up: Your physician wants you to follow-up in: [place]. You should receive a reminder letter in the mail two months in advance. If you do not receive a letter, please call our office 01-2018 to schedule the 03-2018 follow-up appointment.   Thank you for choosing [facility] at [facility]!!

## 2017-04-22 DIAGNOSIS — R1013 Epigastric pain: Secondary | ICD-10-CM | POA: Diagnosis not present

## 2017-04-22 DIAGNOSIS — R140 Abdominal distension (gaseous): Secondary | ICD-10-CM | POA: Diagnosis not present

## 2017-04-22 DIAGNOSIS — Z86010 Personal history of colonic polyps: Secondary | ICD-10-CM | POA: Diagnosis not present

## 2017-04-22 DIAGNOSIS — K5909 Other constipation: Secondary | ICD-10-CM | POA: Diagnosis not present

## 2017-06-14 ENCOUNTER — Telehealth: Payer: Self-pay | Admitting: Internal Medicine

## 2017-06-14 NOTE — Telephone Encounter (Signed)
See comments below and schedule pt.

## 2017-06-14 NOTE — Telephone Encounter (Signed)
sure

## 2017-06-14 NOTE — Telephone Encounter (Signed)
absolutely

## 2017-06-14 NOTE — Telephone Encounter (Signed)
Dr. Lyndel Safe will you accept this pt? Please advise.

## 2017-06-15 ENCOUNTER — Encounter: Payer: Self-pay | Admitting: Gastroenterology

## 2017-06-28 NOTE — Progress Notes (Signed)
HPI: Follow-up palpitations, hyperlipidemia and family history of early coronary disease. Cardiac catheterization February 2012 showed normal coronary arteries and normal LV function. Stress echocardiogram April 2015 normal.  Monitor May 2017 showed sinus with occasional PACs, PVCs and short run of atrial tachycardia. She has been intolerant to beta blockers and calcium blockers. Also intolerant to statins.  Last echocardiogram May 2018 showed normal LV function, grade 1 diastolic dysfunction and mildly dilated aortic root.  Monitor May 2018 showed sinus rhythm with PACs, PVCs and brief PAT.  Since last seen,  she continues to have occasional palpitations.  These are described as a skip and flutter.  These worsen when she has gastrointestinal symptoms such as indigestion.  She has mild dyspnea on exertion.  No exertional chest pain.  No syncope.  Current Outpatient Medications  Medication Sig Dispense Refill  . cholecalciferol (VITAMIN D) 1000 UNITS tablet Take 4,000 Units by mouth daily.     . Coenzyme Q10 (COQ10) 100 MG CAPS Take 100 mg by mouth daily. Reported on 07/18/2015    . Digestive Enzymes (DIGESTIVE ENZYME ULTRA PO) Take 1 tablet by mouth daily.    . Esomeprazole Magnesium (NEXIUM 24HR PO) Take 1 tablet by mouth daily.    . fish oil-omega-3 fatty acids 1000 MG capsule Take 2 g by mouth daily. Reported on 07/18/2015    . Magnesium 200 MG TABS Take 1 tablet (200 mg total) by mouth 2 (two) times daily. 60 each 5  . mometasone (NASONEX) 50 MCG/ACT nasal spray Place 2 sprays into the nose daily.    Marland Kitchen SYNTHROID 50 MCG tablet TAKE 1 TABLET (50 MCG TOTAL) BY MOUTH DAILY BEFORE BREAKFAST. 90 tablet 1   Current Facility-Administered Medications  Medication Dose Route Frequency Provider Last Rate Last Dose  . 0.9 %  sodium chloride infusion  500 mL Intravenous Continuous Irene Shipper, MD         Past Medical History:  Diagnosis Date  . Chest pain    a. negative myoview 2011 [name]);   b. 04/2010 Cath: normal cors  . Colon polyps   . Diverticulosis   . GERD (gastroesophageal reflux disease)    a. mild esophagitis on egd 2012  . Hemorrhoids   . Hiatal hernia   . Hyperlipidemia   . Hyperlipidemia    Is now taking Fish oil. Had myalgias with Vytorin and Lipitor  . Hypothyroidism   . Irritable bowel syndrome   . Palpitations    a. 04/2008 echo: EF 50-60%;  b. 21 day event monitor 2012 - occasional sinus tachycardia with rates 110-120's. Rare PAC's/PVC's. Serveral short 4-5 beat runs of a-tach. c. 06/2016: Event monitor showing PAC's, PVC's, and brief episodes of PAT.   Marland Kitchen Personal history of colonic polyps    a. colonoscopy 2009  . Personal history of malignant neoplasm of breast   . Pre-diabetes     Past Surgical History:  Procedure Laterality Date  . BUNIONECTOMY    . CARPAL TUNNEL RELEASE     bilateral   . COLONOSCOPY    . NASAL SINUS SURGERY    . TONSILLECTOMY      Social History   Socioeconomic History  . Marital status: Married    Spouse name: [name]  . Number of children: 2  . Years of education: Not on file  . Highest education level: Not on file  Occupational History  . Occupation: Retired    Comment: Freight forwarder: [employer]  Social Needs  . Financial resource strain: Not on file  . Food insecurity:    Worry: Not on file    Inability: Not on file  . Transportation needs:    Medical: Not on file    Non-medical: Not on file  Tobacco Use  . Smoking status: Never Smoker  . Smokeless tobacco: Never Used  Substance and Sexual Activity  . Alcohol use: No  . Drug use: No  . Sexual activity: Not on file  Lifestyle  . Physical activity:    Days per week: Not on file    Minutes per session: Not on file  . Stress: Not on file  Relationships  . Social connections:    Talks on phone: Not on file    Gets together: Not on file    Attends religious service: Not on file    Active member of club or organization: Not on file      Attends meetings of clubs or organizations: Not on file    Relationship status: Not on file  . Intimate partner violence:    Fear of current or ex partner: Not on file    Emotionally abused: Not on file    Physically abused: Not on file    Forced sexual activity: Not on file  Other Topics Concern  . Not on file  Social History Narrative  . Not on file    Family History  Problem Relation Age of Onset  . Colon cancer Unknown        Paternal Great Unlce   . Diabetes Paternal Grandmother   . Heart defect Father     ROS: no fevers or chills, productive cough, hemoptysis, dysphasia, odynophagia, melena, hematochezia, dysuria, hematuria, rash, seizure activity, orthopnea, PND, pedal edema, claudication. Remaining systems are negative.  Physical Exam: Well-developed well-nourished in no acute distress.  Skin is warm and dry.  HEENT is normal.  Neck is supple.  Chest is clear to auscultation with normal expansion.  Cardiovascular exam is regular rate and rhythm.  Abdominal exam nontender or distended. No masses palpated. Extremities show no edema. neuro grossly intact  A/P  1 palpitations-monitors have continued to show PACs, PVCs and brief PAT. We discussed the possibility of a beta-blocker for improvement in her palpitations.  However she declines.  Note her LV function is normal.    2 hyperlipidemia-patient has not tolerated statins in the past and did not want Zetia.  Continue diet.  3 significant anxiety-management per primary care.  Kirk Ruths, MD

## 2017-07-11 ENCOUNTER — Ambulatory Visit: Payer: PPO | Admitting: Cardiology

## 2017-07-11 ENCOUNTER — Encounter: Payer: Self-pay | Admitting: Cardiology

## 2017-07-11 VITALS — BP 138/80 | HR 80 | Ht 62.0 in | Wt 108.4 lb

## 2017-07-11 DIAGNOSIS — E7800 Pure hypercholesterolemia, unspecified: Secondary | ICD-10-CM

## 2017-07-11 DIAGNOSIS — R002 Palpitations: Secondary | ICD-10-CM

## 2017-07-11 NOTE — Patient Instructions (Signed)
Your physician wants you to follow-up in: ONE YEAR WITH DR CRENSHAW You will receive a reminder letter in the mail two months in advance. If you don't receive a letter, please call our office to schedule the follow-up appointment.   If you need a refill on your cardiac medications before your next appointment, please call your pharmacy.  

## 2017-07-22 ENCOUNTER — Encounter

## 2017-07-22 ENCOUNTER — Encounter: Payer: Self-pay | Admitting: Gastroenterology

## 2017-07-22 ENCOUNTER — Ambulatory Visit (INDEPENDENT_AMBULATORY_CARE_PROVIDER_SITE_OTHER): Payer: PPO | Admitting: Gastroenterology

## 2017-07-22 VITALS — BP 108/70 | HR 75 | Ht 62.0 in | Wt 106.4 lb

## 2017-07-22 DIAGNOSIS — R140 Abdominal distension (gaseous): Secondary | ICD-10-CM | POA: Diagnosis not present

## 2017-07-22 DIAGNOSIS — K589 Irritable bowel syndrome without diarrhea: Secondary | ICD-10-CM

## 2017-07-22 NOTE — Patient Instructions (Signed)
If you are age 71 or older, your body mass index should be between 23-30. Your Body mass index is 19.46 kg/m. If this is out of the aforementioned range listed, please consider follow up with your Primary Care Provider.  If you are age 7 or younger, your body mass index should be between 19-25. Your Body mass index is 19.46 kg/m. If this is out of the aformentioned range listed, please consider follow up with your Primary Care Provider.    Gluten-Free Diet for Celiac Disease, Adult The gluten-free diet includes all foods that do not contain gluten. Gluten is a protein that is found in wheat, rye, barley, and some other grains. Following the gluten-free diet is the only treatment for people with celiac disease. It helps to prevent damage to the intestines and improves or eliminates the symptoms of celiac disease. Following the gluten-free diet requires some planning. It can be challenging at first, but it gets easier with time and practice. There are more gluten-free options available today than ever before. If you need help finding gluten-free foods or if you have questions, talk with your diet and nutrition specialist (registered dietitian) or your health care provider. What do I need to know about a gluten-free diet?  All fruits, vegetables, and meats are safe to eat and do not contain gluten.  When grocery shopping, start by shopping in the produce, meat, and dairy sections. These sections are more likely to contain gluten-free foods. Then move to the aisles that contain packaged foods if you need to.  Read all food labels. Gluten is often added to foods. Always check the ingredient list and look for warnings, such as "may contain gluten."  Talk with your dietitian or health care provider before taking a gluten-free multivitamin or mineral supplement.  Be aware of gluten-free foods having contact with foods that contain gluten (cross-contamination). This can happen at home and with any  processed foods. ? Talk with your health care provider or dietitian about how to reduce the risk of cross-contamination in your home. ? If you have questions about how a food is processed, ask the manufacturer. What key words help to identify gluten? Foods that list any of these key words on the label usually contain gluten:  Wheat, flour, enriched flour, bromated flour, white flour, durum flour, graham flour, phosphated flour, self-rising flour, semolina, farina, barley (malt), rye, and oats.  Starch, dextrin, modified food starch, or cereal.  Thickening, fillers, or emulsifiers.  Malt flavoring, malt extract, or malt syrup.  Hydrolyzed vegetable protein.  In the [place] packaged foods that are gluten-free are required to be labeled "GF." These foods should be easy to identify and are safe to eat. In the [place] food companies are also required to list common food allergens, including wheat, on their labels. Recommended foods Grains  Amaranth, bean flours, 100% buckwheat flour, corn, millet, nut flours or nut meals, GF oats, quinoa, rice, sorghum, teff, rice wafers, pure cornmeal tortillas, popcorn, and hot cereals made from cornmeal. Hominy, rice, wild rice. Some Asian rice noodles or bean noodles. Arrowroot starch, corn bran, corn flour, corn germ, cornmeal, corn starch, potato flour, potato starch flour, and rice bran. Plain, brown, and sweet rice flours. Rice polish, soy flour, and tapioca starch. Vegetables  All plain fresh, frozen, and canned vegetables. Fruits  All plain fresh, frozen, canned, and dried fruits, and 100% fruit juices. Meats and other protein foods  All fresh beef, pork, poultry, fish, seafood, and eggs. Fish canned in water,  oil, brine, or vegetable broth. Plain nuts and seeds, peanut butter. Some lunch meat and some frankfurters. Dried beans, dried peas, and lentils. Dairy  Fresh plain, dry, evaporated, or condensed milk. Cream, butter, sour cream, whipping cream,  and most yogurts. Unprocessed cheese, most processed cheeses, some cottage cheese, some cream cheeses. Beverages  Coffee, tea, most herbal teas. Carbonated beverages and some root beers. Wine, sake, and distilled spirits, such as gin, vodka, and whiskey. Most hard ciders. Fats and oils  Butter, margarine, vegetable oil, hydrogenated butter, olive oil, shortening, lard, cream, and some mayonnaise. Some commercial salad dressings. Olives. Sweets and desserts  Sugar, honey, some syrups, molasses, jelly, and jam. Plain hard candy, marshmallows, and gumdrops. Pure cocoa powder. Plain chocolate. Custard and some pudding mixes. Gelatin desserts, sorbets, frozen ice pops, and sherbet. Cake, cookies, and other desserts prepared with allowed flours. Some commercial ice creams. Cornstarch, tapioca, and rice puddings. Seasoning and other foods  Some canned or frozen soups. Monosodium glutamate (MSG). Cider, rice, and wine vinegar. Baking soda and baking powder. Cream of tartar. Baking and nutritional yeast. Certain soy sauces made without wheat (ask your dietitian about specific brands that are allowed). Nuts, coconut, and chocolate. Salt, pepper, herbs, spices, flavoring extracts, imitation or artificial flavorings, natural flavorings, and food colorings. Some medicines and supplements. Some lip glosses and other cosmetics. Rice syrups. The items listed may not be a complete list. Talk with your dietitian about what dietary choices are best for you. Foods to avoid Grains  Barley, bran, bulgur, couscous, cracked wheat, [place], farro, graham, malt, matzo, semolina, wheat germ, and all wheat and rye cereals including spelt and kamut. Cereals containing malt as a flavoring, such as rice cereal. Noodles, spaghetti, macaroni, most packaged rice mixes, and all mixes containing wheat, rye, barley, or triticale. Vegetables  Most creamed vegetables and most vegetables canned in sauces. Some commercially prepared  vegetables and salads. Fruits  Thickened or prepared fruits and some pie fillings. Some fruit snacks and fruit roll-ups. Meats and other protein foods  Any meat or meat alternative containing wheat, rye, barley, or gluten stabilizers. These are often marinated or packaged meats and lunch meats. Bread-containing products, such as Swiss steak, croquettes, meatballs, and meatloaf. Most tuna canned in vegetable broth and [place] with hydrolyzed vegetable protein (HVP) injected as part of the basting. Seitan. Imitation fish. Eggs in sauces made from ingredients to avoid. Dairy  Commercial chocolate milk drinks and malted milk. Some non-dairy creamers. Any cheese product containing ingredients to avoid. Beverages  Certain cereal beverages. Beer, ale, malted milk, and some root beers. Some hard ciders. Some instant flavored coffees. Some herbal teas made with barley or with barley malt added. Fats and oils  Some commercial salad dressings. Sour cream containing modified food starch. Sweets and desserts  Some toffees. Chocolate-coated nuts (may be rolled in wheat flour) and some commercial candies and candy bars. Most cakes, cookies, donuts, pastries, and other baked goods. Some commercial ice cream. Ice cream cones. Commercially prepared mixes for cakes, cookies, and other desserts. Bread pudding and other puddings thickened with flour. Products containing brown rice syrup made with barley malt enzyme. Desserts and sweets made with malt flavoring. Seasoning and other foods  Some curry powders, some dry seasoning mixes, some gravy extracts, some meat sauces, some ketchups, some prepared mustards, and horseradish. Certain soy sauces. Malt vinegar. Bouillon and bouillon cubes that contain HVP. Some chip dips, and some chewing gum. Yeast extract. Brewer's yeast. Caramel color. Some medicines and supplements.  Some lip glosses and other cosmetics. The items listed may not be a complete list. Talk with your  dietitian about what dietary choices are best for you. Summary  Gluten is a protein that is found in wheat, rye, barley, and some other grains. The gluten-free diet includes all foods that do not contain gluten.  If you need help finding gluten-free foods or if you have questions, talk with your diet and nutrition specialist (registered dietitian) or your health care provider.  Read all food labels. Gluten is often added to foods. Always check the ingredient list and look for warnings, such as "may contain gluten." This information is not intended to replace advice given to you by your health care provider. Make sure you discuss any questions you have with your health care provider. Document Released: 03/08/2005 Document Revised: 12/22/2015 Document Reviewed: 12/22/2015 Elsevier Interactive Patient Education  2018 [employer].   Thank you,  Dr. Jackquline Denmark

## 2017-07-22 NOTE — Progress Notes (Signed)
Chief Complaint: Abdominal pain with bloating  Referring Provider:  Unk Pinto, MD      ASSESSMENT AND PLAN;   #1. Abdominal pain with bloating, better with IC-2 (Intestinal cleanser) and superezymes. 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 Neg CT 07/2015 except for redundant stool filled colon, negative EGD 07/2016 and colonoscopy 06/2013 except for a small tubular adenoma (rpt colon due 06/2018). Neg celiac screen and normal TSH.  - trial of gluen free diet. - FU in 12 weeks.  - Proceed with rpt CT scan of the abdomen and pelvis if the above work-up is negative and continued problems.   #2. IBS with predom constipation. -Continue IC-2 intestinal cleanser and super enzymes as patient has already been doing for now. -Repeat colonoscopy April 2020. -I have reviewed the CT scan of the abdomen pelvis in detail with the patient and patient's husband. If she continues to have problems, we will give her a trial of Trulance or Linzess, get a hydrogen breath test to rule out small bowel bacterial overgrowth.  HPI:   73 year old, accompanied by her husband  With Long-standing history of abdominal complaints dating back to early youth with significant constipation, abdominal bloating, abdominal pain which gets better with defecation.  She had extensive GI work-up performed including multiple EGDs and colonoscopies, CT scans of the abdomen and pelvis which have been unremarkable.  She had normal CBC, CMP and negative celiac screen.  She had normal TSH.  Most recently , she feels better with IC-2 (Intestinal cleanser) and superezymes.   Neg CT 07/2015 except for redundant stool filled colon, negative EGD 07/2016 and colonoscopy 06/2013 except for a small tubular adenoma (rpt colon due 06/2018).  No nausea, vomiting, heartburn, regurgitation, odynophagia or dysphagia.  There is no melena or hematochezia. No unintentional weight loss.    Past Medical History:  Diagnosis Date  . Chest pain    a.  negative myoview 2011 [name]);  b. 04/2010 Cath: normal cors  . Colon polyps   . Diverticulosis   . GERD (gastroesophageal reflux disease)    a. mild esophagitis on egd 2012  . Hemorrhoids   . Hiatal hernia   . Hyperlipidemia   . Hyperlipidemia    Is now taking Fish oil. Had myalgias with Vytorin and Lipitor  . Hypothyroidism   . Irritable bowel syndrome   . Palpitations    a. 04/2008 echo: EF 50-60%;  b. 21 day event monitor 2012 - occasional sinus tachycardia with rates 110-120's. Rare PAC's/PVC's. Serveral short 4-5 beat runs of a-tach. c. 06/2016: Event monitor showing PAC's, PVC's, and brief episodes of PAT.   Marland Kitchen Personal history of colonic polyps    a. colonoscopy 2009  . Personal history of malignant neoplasm of breast   . Pre-diabetes     Past Surgical History:  Procedure Laterality Date  . BUNIONECTOMY    . CARPAL TUNNEL RELEASE     bilateral   . COLONOSCOPY  07/04/2013   Polyp, Polypectomy. Moderate diverticulosis in L colon. BX: Tubular Adenoma no dyspasia or malignancy. 5 yr rpt colon  . NASAL SINUS SURGERY    . TONSILLECTOMY    . UPPER GASTROINTESTINAL ENDOSCOPY  08/09/2016   normal EGD  . UPPER GASTROINTESTINAL ENDOSCOPY  07/04/2013   normal EGD    Family History  Problem Relation Age of Onset  . Colon cancer Unknown        Paternal Great Unlce   . Diabetes Paternal Grandmother   . Heart defect Father   .  Breast cancer Paternal Aunt     Social History   Tobacco Use  . Smoking status: Never Smoker  . Smokeless tobacco: Never Used  Substance Use Topics  . Alcohol use: No  . Drug use: No    Current Outpatient Medications  Medication Sig Dispense Refill  . cholecalciferol (VITAMIN D) 1000 UNITS tablet Take 4,000 Units by mouth daily.     . Coenzyme Q10 (COQ10) 100 MG CAPS Take 100 mg by mouth daily. Reported on 07/18/2015    . Digestive Enzymes (DIGESTIVE ENZYME ULTRA PO) Take 1 tablet by mouth daily.    . Esomeprazole Magnesium (NEXIUM 24HR PO)  Take 1 tablet by mouth daily.    . fish oil-omega-3 fatty acids 1000 MG capsule Take 2 g by mouth daily. Reported on 07/18/2015    . Magnesium 200 MG TABS Take 1 tablet (200 mg total) by mouth 2 (two) times daily. 60 each 5  . Misc Natural Products (COLON CLEANSER PO) Take by mouth daily. IC-2 Intestinal Cleanser    . mometasone (NASONEX) 50 MCG/ACT nasal spray Place 2 sprays into the nose daily.    Marland Kitchen SYNTHROID 50 MCG tablet TAKE 1 TABLET (50 MCG TOTAL) BY MOUTH DAILY BEFORE BREAKFAST. 90 tablet 1   Current Facility-Administered Medications  Medication Dose Route Frequency Provider Last Rate Last Dose  . 0.9 %  sodium chloride infusion  500 mL Intravenous Continuous Irene Shipper, MD        Allergies  Allergen Reactions  . Armour Thyroid [Thyroid]     Palpitations   . Atenolol     presyncope  . Celexa [Citalopram Hydrobromide]     Severe chest tightness   . Diltiazem Hcl     Dropped bp  . Levaquin [Levofloxacin In D5w] Nausea And Vomiting  . Lipitor [Atorvastatin]     Myalgias   . Propranolol Hcl     Felt bad  . Vytorin [Ezetimibe-Simvastatin]     Myalgias     Review of Systems:  Constitutional: Denies fever, chills, diaphoresis, appetite change. Has fatigue.  HEENT: Denies photophobia, eye pain, redness, hearing loss, ear pain, congestion, sore throat, rhinorrhea, sneezing, mouth sores, neck pain, neck stiffness and tinnitus.   Respiratory: Denies SOB, DOE, cough, chest tightness,  and wheezing.   Cardiovascular: Denies chest pain, palpitations and leg swelling.  Genitourinary: Denies dysuria, urgency, frequency, hematuria, flank pain and difficulty urinating. Occ incontinence.  Musculoskeletal: Has myalgias, back pain. No joint swelling, arthralgias and gait problem.  Skin: No rash.  Neurological: Denies dizziness, seizures, syncope, weakness, light-headedness, numbness and headaches.  Hematological: Denies adenopathy. Easy bruising, personal or family bleeding history    Psychiatric/Behavioral: Has anxiety or depression     Physical Exam:    BP 108/70   Pulse 75   Ht 5\' 2"  (1.575 m)   Wt 106 lb 6 oz (48.3 kg)   BMI 19.46 kg/m  Filed Weights   07/22/17 0859  Weight: 106 lb 6 oz (48.3 kg)   Constitutional:  Well-developed, in no acute distress. Psychiatric: Normal mood and affect. Behavior is normal. HEENT: Pupils normal.  Conjunctivae are normal. No scleral icterus. Neck supple.  Cardiovascular: Normal rate, regular rhythm. No edema Pulmonary/chest: Effort normal and breath sounds normal. No wheezing, rales or rhonchi. Abdominal: Soft, nondistended. Nontender. Bowel sounds active throughout. There are no masses palpable. No hepatomegaly. Rectal:  defered Neurological: Alert and oriented to person place and time. Skin: Skin is warm and dry. No rashes noted.  Data Reviewed:  I have personally reviewed following labs and imaging studies  CBC: CBC Latest Ref Rng & Units 07/15/2016 06/21/2016 07/16/2015  WBC 3.8 - 10.8 K/uL 6.0 7.6 5.7  Hemoglobin 11.7 - 15.5 g/dL 13.5 13.3 13.8  Hematocrit 35.0 - 45.0 % 39.1 40.1 40.1  Platelets 140 - 400 K/uL 324 367 341    CMP: CMP Latest Ref Rng & Units 07/15/2016 06/21/2016 07/16/2015  Glucose 65 - 99 mg/dL 73 84 89  BUN 7 - 25 mg/dL 11 16 20   Creatinine 0.50 - 0.99 mg/dL 0.74 0.77 0.76  Sodium 135 - 146 mmol/L 140 138 139  Potassium 3.5 - 5.3 mmol/L 4.3 4.5 4.2  Chloride 98 - 110 mmol/L 102 100 102  CO2 20 - 31 mmol/L 25 27 28   Calcium 8.6 - 10.4 mg/dL 9.9 10.2 9.6  Total Protein 6.1 - 8.1 g/dL 6.9 7.5 7.2  Total Bilirubin 0.2 - 1.2 mg/dL 0.4 0.3 0.4  Alkaline Phos 33 - 130 U/L 102 220(H) 94  AST 10 - 35 U/L 21 59(H) 18  ALT 6 - 29 U/L 16 173(H) 19    CT 07/2015 -reviewed in detail with the patient. I have shown her the actual films   Carmell Austria, MD   Cc: Unk Pinto, MD

## 2017-08-05 ENCOUNTER — Other Ambulatory Visit: Payer: Self-pay | Admitting: Internal Medicine

## 2017-11-07 ENCOUNTER — Ambulatory Visit: Payer: Self-pay | Admitting: Allergy and Immunology

## 2017-11-28 ENCOUNTER — Ambulatory Visit: Payer: Self-pay | Admitting: Allergy and Immunology

## 2017-12-06 ENCOUNTER — Encounter: Payer: Self-pay | Admitting: Gastroenterology

## 2017-12-06 ENCOUNTER — Ambulatory Visit (INDEPENDENT_AMBULATORY_CARE_PROVIDER_SITE_OTHER): Payer: PPO | Admitting: Gastroenterology

## 2017-12-06 VITALS — BP 138/84 | HR 72 | Ht 62.0 in | Wt 112.0 lb

## 2017-12-06 DIAGNOSIS — K589 Irritable bowel syndrome without diarrhea: Secondary | ICD-10-CM | POA: Diagnosis not present

## 2017-12-06 DIAGNOSIS — R140 Abdominal distension (gaseous): Secondary | ICD-10-CM

## 2017-12-06 NOTE — Patient Instructions (Signed)
If you are age 71 or older, your body mass index should be between 23-30. Your Body mass index is 20.49 kg/m. If this is out of the aforementioned range listed, please consider follow up with your Primary Care Provider.  If you are age 60 or younger, your body mass index should be between 19-25. Your Body mass index is 20.49 kg/m. If this is out of the aformentioned range listed, please consider follow up with your Primary Care Provider.   You will be due for a recall colonoscopy in 06/2018. We will send you a reminder in the mail when it gets closer to that time.  Thank you,  Dr. Jackquline [place]

## 2017-12-06 NOTE — Progress Notes (Signed)
IMPRESSION and PLAN:   #1. Abdominal pain with bloating, better with IC-2 (Intestinal cleanser) and superezymes. 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 Neg CT 07/2015 except for redundant stool filled colon, negative EGD 07/2016 and colonoscopy 06/2013 except for a small tubular adenoma (rpt colon due 06/2018). Neg celiac screen and normal TSH. #2. IBS with predom constipation. - She has stopped IC-2 intestinal cleanser and takes 1/day super enzymes with the largest meal.  Can continue doing so. - Repeat colonoscopy April 2020. - I have reviewed the CT scan of the abdomen pelvis in detail with the patient and patient's husband. If she continues to have problems, we will give her a trial of gluten free diet. - Hold off on hydrogen breath test to rule out small bowel bacterial overgrowth. - FU 6 months. Earlier, if any problems.      HPI:    Chief Complaint:   Andrea Bates is a 72 y.o. female  Feels better The abdominal pain is better So is the bloating Eating better Has gained 6lb since last visit   Past Medical History:  Diagnosis Date  . Chest pain    a. negative myoview 2011 [name]);  b. 04/2010 Cath: normal cors  . Colon polyps   . Diverticulosis   . GERD (gastroesophageal reflux disease)    a. mild esophagitis on egd 2012  . Hemorrhoids   . Hiatal hernia   . Hyperlipidemia   . Hyperlipidemia    Is now taking Fish oil. Had myalgias with Vytorin and Lipitor  . Hypothyroidism   . Irritable bowel syndrome   . Palpitations    a. 04/2008 echo: EF 50-60%;  b. 21 day event monitor 2012 - occasional sinus tachycardia with rates 110-120's. Rare PAC's/PVC's. Serveral short 4-5 beat runs of a-tach. c. 06/2016: Event monitor showing PAC's, PVC's, and brief episodes of PAT.   Marland Kitchen Personal history of colonic polyps    a. colonoscopy 2009  . Personal history of malignant neoplasm of breast   . Pre-diabetes     Current Outpatient Medications  Medication Sig Dispense Refill  .  cholecalciferol (VITAMIN D) 1000 UNITS tablet Take 4,000 Units by mouth daily.     . Coenzyme Q10 (COQ10) 100 MG CAPS Take 100 mg by mouth daily. Reported on 07/18/2015    . Digestive Enzymes (DIGESTIVE ENZYME ULTRA PO) Take 1 tablet by mouth daily.    . Esomeprazole Magnesium (NEXIUM 24HR PO) Take 1 tablet by mouth daily.    . fish oil-omega-3 fatty acids 1000 MG capsule Take 2 g by mouth daily. Reported on 07/18/2015    . Magnesium 200 MG TABS Take 1 tablet (200 mg total) by mouth 2 (two) times daily. 60 each 5  . Misc Natural Products (COLON CLEANSER PO) Take by mouth daily. IC-2 Intestinal Cleanser    . mometasone (NASONEX) 50 MCG/ACT nasal spray Place 2 sprays into the nose daily.    Marland Kitchen SYNTHROID 50 MCG tablet TAKE 1 TABLET (50 MCG TOTAL) BY MOUTH DAILY BEFORE BREAKFAST. 90 tablet 1   Current Facility-Administered Medications  Medication Dose Route Frequency Provider Last Rate Last Dose  . 0.9 %  sodium chloride infusion  500 mL Intravenous Continuous Irene Shipper, MD        Past Surgical History:  Procedure Laterality Date  . BUNIONECTOMY    . CARPAL TUNNEL RELEASE     bilateral   . COLONOSCOPY  07/04/2013   Polyp, Polypectomy. Moderate diverticulosis in L colon. BX:  Tubular Adenoma no dyspasia or malignancy. 5 yr rpt colon  . NASAL SINUS SURGERY    . TONSILLECTOMY    . UPPER GASTROINTESTINAL ENDOSCOPY  08/09/2016   normal EGD  . UPPER GASTROINTESTINAL ENDOSCOPY  07/04/2013   normal EGD    Family History  Problem Relation Age of Onset  . Colon cancer Unknown        Paternal Great Unlce   . Diabetes Paternal Grandmother   . Heart defect Father   . Breast cancer Paternal Aunt     Social History   Tobacco Use  . Smoking status: Never Smoker  . Smokeless tobacco: Never Used  Substance Use Topics  . Alcohol use: No  . Drug use: No    Allergies  Allergen Reactions  . Armour Thyroid [Thyroid]     Palpitations   . Atenolol     presyncope  . Celexa [Citalopram  Hydrobromide]     Severe chest tightness   . Diltiazem Hcl     Dropped bp  . Levaquin [Levofloxacin In D5w] Nausea And Vomiting  . Lipitor [Atorvastatin]     Myalgias   . Propranolol Hcl     Felt bad  . Vytorin [Ezetimibe-Simvastatin]     Myalgias      Review of Systems: All systems reviewed and negative except where noted in HPI.    Physical Exam:     BP 138/84   Pulse 72   Ht 5\' 2"  (1.575 m)   Wt 112 lb (50.8 kg)   BMI 20.49 kg/m  @WEIGHTLAST3 @ GENERAL:  Alert, oriented, cooperative, not in acute distress. PSYCH: :Pleasant, normal mood and affect. HEENT:  conjunctiva pink, mucous membranes moist, neck supple without masses. No jaundice. CARDIAC:  S1 S2 normal. No murmers. PULM: Normal respiratory effort, lungs CTA bilaterally, no wheezing. ABDOMEN: Inspection: No visible peristalsis, no abnormal pulsations, skin normal.  Palpation/percussion: Soft, nontender, nondistended, no rigidity, no abnormal dullness to percussion, no hepatosplenomegaly and no palpable abdominal masses.  Auscultation: Normal bowel sounds, no abdominal bruits. Rectal exam: Deferred SKIN:  turgor, no lesions seen. Musculoskeletal:  Normal muscle tone, normal strength. NEURO: Alert and oriented x 3, no focal neurologic deficits.    Leasha Goldberger,MD 12/06/2017, 3:31 PM   CC Unk Pinto, MD

## 2018-02-10 ENCOUNTER — Other Ambulatory Visit: Payer: Self-pay | Admitting: Physician Assistant

## 2018-03-15 ENCOUNTER — Other Ambulatory Visit: Payer: Self-pay | Admitting: Internal Medicine

## 2018-05-02 ENCOUNTER — Telehealth: Payer: Self-pay | Admitting: Gastroenterology

## 2018-05-02 DIAGNOSIS — E039 Hypothyroidism, unspecified: Secondary | ICD-10-CM

## 2018-05-02 DIAGNOSIS — Z0000 Encounter for general adult medical examination without abnormal findings: Secondary | ICD-10-CM

## 2018-05-02 NOTE — Telephone Encounter (Signed)
Pt will like for dr.Gupta to refer her to a thyroid spec.

## 2018-05-02 NOTE — Telephone Encounter (Signed)
Called and spoke with patient - patient is requesting to be referred to an alternative PCP (to prescribe her thyroid medications and to "take care of me")-patient is requesting a PCP that Dr. Lyndel Safe prefers her to see;  Please advise

## 2018-05-08 NOTE — Telephone Encounter (Signed)
Called and spoke with patient's husband-verified DPR-Chris reports the patient is needing a referral to an alternative PCP due to irreconcilable differences in opinion on health care management-patient is needing a referral to a PCP that will "handle" her thyroid medication as well as general health care needs;  Please advise on alternative PCP-

## 2018-05-08 NOTE — Telephone Encounter (Signed)
I believe she is seeing Dr Melford Aase who is excellent She can be referred to thyroid specialist through him

## 2018-05-09 ENCOUNTER — Encounter: Payer: Self-pay | Admitting: Adult Health Nurse Practitioner

## 2018-05-09 ENCOUNTER — Ambulatory Visit (INDEPENDENT_AMBULATORY_CARE_PROVIDER_SITE_OTHER): Payer: PPO | Admitting: Adult Health Nurse Practitioner

## 2018-05-09 VITALS — BP 126/80 | HR 67 | Temp 98.8°F | Ht 62.0 in | Wt 111.8 lb

## 2018-05-09 DIAGNOSIS — E7800 Pure hypercholesterolemia, unspecified: Secondary | ICD-10-CM | POA: Diagnosis not present

## 2018-05-09 DIAGNOSIS — Z79899 Other long term (current) drug therapy: Secondary | ICD-10-CM

## 2018-05-09 DIAGNOSIS — K219 Gastro-esophageal reflux disease without esophagitis: Secondary | ICD-10-CM

## 2018-05-09 DIAGNOSIS — E039 Hypothyroidism, unspecified: Secondary | ICD-10-CM | POA: Diagnosis not present

## 2018-05-09 MED ORDER — SYNTHROID 50 MCG PO TABS: ORAL_TABLET | ORAL | 1 refills | Status: DC

## 2018-05-09 NOTE — Progress Notes (Addendum)
Assessment and Plan:  Andrea Bates was seen today for follow-up.  Diagnoses and all orders for this visit:  Hypothyroidism, unspecified type -     TSH -     SYNTHROID 50 MCG tablet; Take 1 tablet daily for Thyroid  continue medications the same pending lab results reminded to take on an empty stomach 30-97mins before food.    Gastroesophageal reflux disease without esophagitis Doing well, continue current regiment -Magnesium  Pure hypercholesterolemia Continue CQ10 and Omega-3 Does not tolerate statins related to side effects Declines to try other medications, zetia. -     Lipid panel  Medication management -     CBC with Differential/Platelet -     COMPLETE METABOLIC PANEL WITH GFR -     Magnesium -     Lipid panel -     TSH -     Hemoglobin A1c -     Insulin, random -     VITAMIN D 25 Hydroxy (Vit-D Deficiency, Fractures) -     Vitamin B12      Further disposition pending results of labs. Discussed med's effects and SE's.   Over 30 minutes of exam, counseling, chart review, and critical decision making was performed.   Future Appointments  Date Time Provider [place]  10/31/2018  9:00 AM Liane Comber, NP [facility] None    ------------------------------------------------------------------------------------------------------------------   HPI 73 y.o.female presents forThyroid management.  She had a lab report with her from 1/21 with TSH 1.23.  She was following with GI related to IBS, pressure symptoms and has seen miltiple providers for this.  Patient reports that she has been now following with Dr Lyndel Safe.  Reports her diaphragm sits high on left side so when she has gas this causes pressure on her heart.  Reports it is difficult for her to lay down at night related to this.  Once she is able to burp or clear some of that air she gets relief.  She has been taking a probiotic which has helped significantly.  She is overdue for labs and physical with our office.   Patient agrees to have labs check today.  Reports she also follows with cardiology.  Patient to contact if she would like labs forwarded.  Discussed importance of routine health maintenance.  Patient agrees to follow up in 6 months, pending TSH results.  No other health concerns today.  Last appointment was 07/15/16 and overdue for a physical and AWV.  Past Medical History:  Diagnosis Date  . Chest pain    a. negative myoview 2011 [name]);  b. 04/2010 Cath: normal cors  . Colon polyps   . Diverticulosis   . GERD (gastroesophageal reflux disease)    a. mild esophagitis on egd 2012  . Hemorrhoids   . Hiatal hernia   . Hyperlipidemia   . Hyperlipidemia    Is now taking Fish oil. Had myalgias with Vytorin and Lipitor  . Hypothyroidism   . Irritable bowel syndrome   . Palpitations    a. 04/2008 echo: EF 50-60%;  b. 21 day event monitor 2012 - occasional sinus tachycardia with rates 110-120's. Rare PAC's/PVC's. Serveral short 4-5 beat runs of a-tach. c. 06/2016: Event monitor showing PAC's, PVC's, and brief episodes of PAT.   Marland Kitchen Personal history of colonic polyps    a. colonoscopy 2009  . Personal history of malignant neoplasm of breast   . Pre-diabetes      Allergies  Allergen Reactions  . Armour Thyroid [Thyroid]     Palpitations   .  Atenolol     presyncope  . Celexa [Citalopram Hydrobromide]     Severe chest tightness   . Diltiazem Hcl     Dropped bp  . Levaquin [Levofloxacin In D5w] Nausea And Vomiting  . Lipitor [Atorvastatin]     Myalgias   . Propranolol Hcl     Felt bad  . Vytorin [Ezetimibe-Simvastatin]     Myalgias     Current Outpatient Medications on File Prior to Visit  Medication Sig  . cholecalciferol (VITAMIN D) 1000 UNITS tablet Take 4,000 Units by mouth daily.   . Coenzyme Q10 (COQ10) 100 MG CAPS Take 100 mg by mouth daily. Reported on 07/18/2015  . Digestive Enzymes (DIGESTIVE ENZYME ULTRA PO) Take 1 tablet by mouth daily.  . Esomeprazole Magnesium  (NEXIUM 24HR PO) Take 1 tablet by mouth daily.  . fish oil-omega-3 fatty acids 1000 MG capsule Take 2 g by mouth daily. Reported on 07/18/2015  . Magnesium 200 MG TABS Take 1 tablet (200 mg total) by mouth 2 (two) times daily.  . mometasone (NASONEX) 50 MCG/ACT nasal spray Place 2 sprays into the nose daily.   Current Facility-Administered Medications on File Prior to Visit  Medication  . 0.9 %  sodium chloride infusion    ROS: Review of Systems  Constitutional: Negative for chills, diaphoresis, fever, malaise/fatigue and weight loss.  HENT: Negative for congestion, ear discharge, ear pain, hearing loss, nosebleeds, sinus pain, sore throat and tinnitus.   Eyes: Negative for blurred vision, double vision, photophobia, pain, discharge and redness.  Respiratory: Negative for cough, hemoptysis, sputum production, shortness of breath, wheezing and stridor.   Cardiovascular: Negative for chest pain, palpitations, orthopnea, claudication, leg swelling and PND.  Gastrointestinal: Negative for abdominal pain, blood in stool, constipation, diarrhea, heartburn, melena, nausea and vomiting.       Endorses gas with mild bloating at times.  Genitourinary: Negative for dysuria, flank pain, frequency, hematuria and urgency.  Musculoskeletal: Negative for back pain, falls, joint pain, myalgias and neck pain.  Skin: Negative for itching and rash.  Neurological: Negative for dizziness, tingling, tremors, sensory change, speech change, focal weakness, seizures, loss of consciousness, weakness and headaches.  Endo/Heme/Allergies: Negative for environmental allergies and polydipsia. Does not bruise/bleed easily.  Psychiatric/Behavioral: Negative for depression, hallucinations, memory loss, substance abuse and suicidal ideas. The patient is not nervous/anxious and does not have insomnia.      Physical Exam:  BP 126/80   Pulse 67   Temp 98.8 F (37.1 C)   Ht 5\' 2"  (1.575 m)   Wt 111 lb 12.8 oz (50.7 kg)    SpO2 99%   BMI 20.45 kg/m   General Appearance: Well nourished, in no apparent distress. Eyes: PERRLA, EOMs, conjunctiva no swelling or erythema Sinuses: No Frontal/maxillary tenderness ENT/Mouth: Ext aud canals clear, TMs without erythema, bulging. No erythema, swelling, or exudate on post pharynx.  Tonsils not swollen or erythematous. Hearing normal.  Neck: Supple, thyroid normal.  Respiratory: Respiratory effort normal, BS equal bilaterally without rales, rhonchi, wheezing or stridor.  Cardio: RRR with no MRGs. Brisk peripheral pulses without edema.  Abdomen: Soft, + BS.  Non tender, no guarding, rebound, hernias, masses. Lymphatics: Non tender without lymphadenopathy.  Musculoskeletal: Full ROM, 5/5 strength, normal gait.  Skin: Warm, dry without rashes, lesions, ecchymosis.  Neuro: Cranial nerves intact. Normal muscle tone, no cerebellar symptoms. Sensation intact.  Psych: Awake and oriented X 3, normal affect, Insight and Judgment appropriate.      Garnet Sierras, NP 12:16 PM  [facility]

## 2018-05-09 NOTE — Telephone Encounter (Signed)
Called and spoke with patient-patient informed of MD recommendations; Patient is agreeable with plan of care; Referral information faxed to Dr. Riki Sheer to begin the transfer of care from current PCP to referral PCP; patient advised she may need to sign a release of information consent at current PCP's office in order for records to be faxed to referral PCP; Patient verbalized understanding of information/instructions;  Patient was advised to call the office at [phone] if questions/concerns arise;

## 2018-05-09 NOTE — Telephone Encounter (Signed)
Ref to Dr Riki Sheer at [place] @68 

## 2018-05-09 NOTE — Patient Instructions (Signed)
We will contact you with lab results via MyChart in 1-3 days.  We Do NOT Approve of  [facility], [employer]  Our Patients  To Do Home Visits & We Do NOT Approve of [employer] > > > > > > > > > > > > > > > > > > > > > > > > > > > > > > > > > > > > > > >  Preventive Care for Adults  A healthy lifestyle and preventive care can promote health and wellness. Preventive health guidelines for women include the following key practices.  A routine yearly physical is a good way to check with your health care provider about your health and preventive screening. It is a chance to share any concerns and updates on your health and to receive a thorough exam.  Visit your dentist for a routine exam and preventive care every 6 months. Brush your teeth twice a day and floss once a day. Good oral hygiene prevents tooth decay and gum disease.  The frequency of eye exams is based on your age, health, family medical history, use of contact lenses, and other factors. Follow your health care provider's recommendations for frequency of eye exams.  Eat a healthy diet. Foods like vegetables, fruits, whole grains, low-fat dairy products, and lean protein foods contain the nutrients you need without too many calories. Decrease your intake of foods high in solid fats, added sugars, and salt. Eat the right amount of calories for you. Get information about a proper diet from your health care provider, if necessary.  Regular physical exercise is one of the most important things you can do for your health. Most adults should get at least 150 minutes of moderate-intensity exercise (any activity that increases your heart rate and causes you to sweat) each week. In addition, most adults need muscle-strengthening exercises on 2 or more days a week.  Maintain a healthy weight. The body mass index (BMI) is a screening tool to identify possible weight problems. It provides an estimate of body fat based on  height and weight. Your health care provider can find your BMI and can help you achieve or maintain a healthy weight. For adults 20 years and older:  A BMI below 18.5 is considered underweight.  A BMI of 18.5 to 24.9 is normal.  A BMI of 25 to 29.9 is considered overweight.  A BMI of 30 and above is considered obese.  Maintain normal blood lipids and cholesterol levels by exercising and minimizing your intake of saturated fat. Eat a balanced diet with plenty of fruit and vegetables. If your lipid or cholesterol levels are high, you are over 50, or you are at high risk for heart disease, you may need your cholesterol levels checked more frequently. Ongoing high lipid and cholesterol levels should be treated with medicines if diet and exercise are not working.  If you smoke, find out from your health care provider how to quit. If you do not use tobacco, do not start.  Lung cancer screening is recommended for adults aged 11-80 years who are at high risk for developing lung cancer because of a history of smoking. A yearly low-dose CT scan of the lungs is recommended for people who have at least a 30-pack-year history of smoking and are a current smoker or have quit within the past 15 years. A pack year of smoking is smoking an average of 1 pack of cigarettes a day for 1 year (for  example: 1 pack a day for 30 years or 2 packs a day for 15 years). Yearly screening should continue until the smoker has stopped smoking for at least 15 years. Yearly screening should be stopped for people who develop a health problem that would prevent them from having lung cancer treatment.  Avoid use of street drugs. Do not share needles with anyone. Ask for help if you need support or instructions about stopping the use of drugs.  High blood pressure causes heart disease and increases the risk of stroke.  Ongoing high blood pressure should be treated with medicines if weight loss and exercise do not work.  If you are  17-76 years old, ask your health care provider if you should take aspirin to prevent strokes.  Diabetes screening involves taking a blood sample to check your fasting blood sugar level. This should be done once every 3 years, after age 22, if you are within normal weight and without risk factors for diabetes. Testing should be considered at a younger age or be carried out more frequently if you are overweight and have at least 1 risk factor for diabetes.  Breast cancer screening is essential preventive care for women. You should practice "breast self-awareness." This means understanding the normal appearance and feel of your breasts and may include breast self-examination. Any changes detected, no matter how small, should be reported to a health care provider. Women in their 57s and 30s should have a clinical breast exam (CBE) by a health care provider as part of a regular health exam every 1 to 3 years. After age 105, women should have a CBE every year. Starting at age 9, women should consider having a mammogram (breast X-ray test) every year. Women who have a family history of breast cancer should talk to their health care provider about genetic screening. Women at a high risk of breast cancer should talk to their health care providers about having an MRI and a mammogram every year.  Breast cancer gene (BRCA)-related cancer risk assessment is recommended for women who have family members with BRCA-related cancers. BRCA-related cancers include breast, ovarian, tubal, and peritoneal cancers. Having family members with these cancers may be associated with an increased risk for harmful changes (mutations) in the breast cancer genes BRCA1 and BRCA2. Results of the assessment will determine the need for genetic counseling and BRCA1 and BRCA2 testing.  Routine pelvic exams to screen for cancer are no longer recommended for nonpregnant women who are considered low risk for cancer of the pelvic organs (ovaries,  uterus, and vagina) and who do not have symptoms. Ask your health care provider if a screening pelvic exam is right for you.  If you have had past treatment for cervical cancer or a condition that could lead to cancer, you need Pap tests and screening for cancer for at least 20 years after your treatment. If Pap tests have been discontinued, your risk factors (such as having a new sexual partner) need to be reassessed to determine if screening should be resumed. Some women have medical problems that increase the chance of getting cervical cancer. In these cases, your health care provider may recommend more frequent screening and Pap tests.    Colorectal cancer can be detected and often prevented. Most routine colorectal cancer screening begins at the age of 43 years and continues through age 13 years. However, your health care provider may recommend screening at an earlier age if you have risk factors for colon cancer. On a  yearly basis, your health care provider may provide home test kits to check for hidden blood in the stool. Use of a small camera at the end of a tube, to directly examine the colon (sigmoidoscopy or colonoscopy), can detect the earliest forms of colorectal cancer. Talk to your health care provider about this at age 66, when routine screening begins.  Direct exam of the colon should be repeated every 5-10 years through age 3 years, unless early forms of pre-cancerous polyps or small growths are found.  Osteoporosis is a disease in which the bones lose minerals and strength with aging. This can result in serious bone fractures or breaks. The risk of osteoporosis can be identified using a bone density scan. Women ages 6 years and over and women at risk for fractures or osteoporosis should discuss screening with their health care providers. Ask your health care provider whether you should take a calcium supplement or vitamin D to reduce the rate of osteoporosis.  Menopause can be  associated with physical symptoms and risks. Hormone replacement therapy is available to decrease symptoms and risks. You should talk to your health care provider about whether hormone replacement therapy is right for you.  Use sunscreen. Apply sunscreen liberally and repeatedly throughout the day. You should seek shade when your shadow is shorter than you. Protect yourself by wearing long sleeves, pants, a wide-brimmed hat, and sunglasses year round, whenever you are outdoors.  Once a month, do a whole body skin exam, using a mirror to look at the skin on your back. Tell your health care provider of new moles, moles that have irregular borders, moles that are larger than a pencil eraser, or moles that have changed in shape or color.  Stay current with required vaccines (immunizations).  Influenza vaccine. All adults should be immunized every year.  Tetanus, diphtheria, and acellular pertussis (Td, Tdap) vaccine. Pregnant women should receive 1 dose of Tdap vaccine during each pregnancy. The dose should be obtained regardless of the length of time since the last dose. Immunization is preferred during the 27th-36th week of gestation. An adult who has not previously received Tdap or who does not know her vaccine status should receive 1 dose of Tdap. This initial dose should be followed by tetanus and diphtheria toxoids (Td) booster doses every 10 years. Adults with an unknown or incomplete history of completing a 3-dose immunization series with Td-containing vaccines should begin or complete a primary immunization series including a Tdap dose. Adults should receive a Td booster every 10 years.    Zoster vaccine. One dose is recommended for adults aged 66 years or older unless certain conditions are present.    Pneumococcal 13-valent conjugate (PCV13) vaccine. When indicated, a person who is uncertain of her immunization history and has no record of immunization should receive the PCV13 vaccine. An  adult aged 52 years or older who has certain medical conditions and has not been previously immunized should receive 1 dose of PCV13 vaccine. This PCV13 should be followed with a dose of pneumococcal polysaccharide (PPSV23) vaccine. The PPSV23 vaccine dose should be obtained at least 1 or more year(s) after the dose of PCV13 vaccine. An adult aged 19 years or older who has certain medical conditions and previously received 1 or more doses of PPSV23 vaccine should receive 1 dose of PCV13. The PCV13 vaccine dose should be obtained 1 or more years after the last PPSV23 vaccine dose.    Pneumococcal polysaccharide (PPSV23) vaccine. When PCV13 is also indicated,  PCV13 should be obtained first. All adults aged 40 years and older should be immunized. An adult younger than age 40 years who has certain medical conditions should be immunized. Any person who resides in a nursing home or long-term care facility should be immunized. An adult smoker should be immunized. People with an immunocompromised condition and certain other conditions should receive both PCV13 and PPSV23 vaccines. People with human immunodeficiency virus (HIV) infection should be immunized as soon as possible after diagnosis. Immunization during chemotherapy or radiation therapy should be avoided. Routine use of PPSV23 vaccine is not recommended for American Indians, [place] Natives, or people younger than 65 years unless there are medical conditions that require PPSV23 vaccine. When indicated, people who have unknown immunization and have no record of immunization should receive PPSV23 vaccine. One-time revaccination 5 years after the first dose of PPSV23 is recommended for people aged 19-64 years who have chronic kidney failure, nephrotic syndrome, asplenia, or immunocompromised conditions. People who received 1-2 doses of PPSV23 before age 52 years should receive another dose of PPSV23 vaccine at age 26 years or later if at least 5 years have passed  since the previous dose. Doses of PPSV23 are not needed for people immunized with PPSV23 at or after age 59 years.   Preventive Services / Frequency  Ages 86 years and over  Blood pressure check.  Lipid and cholesterol check.  Lung cancer screening. / Every year if you are aged 44-80 years and have a 30-pack-year history of smoking and currently smoke or have quit within the past 15 years. Yearly screening is stopped once you have quit smoking for at least 15 years or develop a health problem that would prevent you from having lung cancer treatment.  Clinical breast exam.** / Every year after age 70 years.   BRCA-related cancer risk assessment.** / For women who have family members with a BRCA-related cancer (breast, ovarian, tubal, or peritoneal cancers).  Mammogram.** / Every year beginning at age 82 years and continuing for as long as you are in good health. Consult with your health care provider.  Pap test.** / Every 3 years starting at age 45 years through age 40 or 32 years with 3 consecutive normal Pap tests. Testing can be stopped between 65 and 70 years with 3 consecutive normal Pap tests and no abnormal Pap or HPV tests in the past 10 years.  Fecal occult blood test (FOBT) of stool. / Every year beginning at age 69 years and continuing until age 9 years. You may not need to do this test if you get a colonoscopy every 10 years.  Flexible sigmoidoscopy or colonoscopy.** / Every 5 years for a flexible sigmoidoscopy or every 10 years for a colonoscopy beginning at age 71 years and continuing until age 92 years.  Hepatitis C blood test.** / For all people born from [birth year] through 1964 and any individual with known risks for hepatitis C.  Osteoporosis screening.** / A one-time screening for women ages 34 years and over and women at risk for fractures or osteoporosis.  Skin self-exam. / Monthly.  Influenza vaccine. / Every year.  Tetanus, diphtheria, and acellular pertussis  (Tdap/Td) vaccine.** / 1 dose of Td every 10 years.  Zoster vaccine.** / 1 dose for adults aged 75 years or older.  Pneumococcal 13-valent conjugate (PCV13) vaccine.** / Consult your health care provider.  Pneumococcal polysaccharide (PPSV23) vaccine.** / 1 dose for all adults aged 67 years and older. Screening for abdominal aortic aneurysm (AAA)  by ultrasound is recommended for people who have history of high blood pressure or who are current or former smokers. ++++++++++++++++++++ Recommend Adult Low Dose Aspirin or  coated  Aspirin 81 mg daily  To reduce risk of Colon Cancer 20 %,  Skin Cancer 26 % ,  Melanoma 46%  and  Pancreatic cancer 60% ++++++++++++++++++++ Vitamin D goal  is between 70-100.  Please make sure that you are taking your Vitamin D as directed.  It is very important as a natural anti-inflammatory  helping hair, skin, and nails, as well as reducing stroke and heart attack risk.  It helps your bones and helps with mood. It also decreases numerous cancer risks so please take it as directed.  Low Vit D is associated with a 200-300% higher risk for CANCER  and 200-300% higher risk for HEART   ATTACK  &  STROKE.   .....................................Marland Kitchen It is also associated with higher death rate at younger ages,  autoimmune diseases like Rheumatoid arthritis, Lupus, Multiple Sclerosis.    Also many other serious conditions, like depression, Alzheimer's Dementia, infertility, muscle aches, fatigue, fibromyalgia - just to name a few. ++++++++++++++++++ Recommend the book "The END of DIETING" by Dr Excell Seltzer  & the book "The END of DIABETES " by Dr Excell Seltzer At [email or web address] - get book & Audio CD's    Being diabetic has a  300% increased risk for heart attack, stroke, cancer, and alzheimer- type vascular dementia. It is very important that you work harder with diet by avoiding all foods that are white. Avoid white rice (brown & wild rice is OK), white potatoes  (sweetpotatoes in moderation is OK), White bread or wheat bread or anything made out of white flour like bagels, donuts, rolls, buns, biscuits, cakes, pastries, cookies, pizza crust, and pasta (made from white flour & egg whites) - vegetarian pasta or spinach or wheat pasta is OK. Multigrain breads like Arnold's or [employer], or multigrain sandwich thins or flatbreads.  Diet, exercise and weight loss can reverse and cure diabetes in the early stages.  Diet, exercise and weight loss is very important in the control and prevention of complications of diabetes which affects every system in your body, ie. Brain - dementia/stroke, eyes - glaucoma/blindness, heart - heart attack/heart failure, kidneys - dialysis, stomach - gastric paralysis, intestines - malabsorption, nerves - severe painful neuritis, circulation - gangrene & loss of a leg(s), and finally cancer and Alzheimers.    I recommend avoid fried & greasy foods,  sweets/candy, white rice (brown or wild rice or Quinoa is OK), white potatoes (sweet potatoes are OK) - anything made from white flour - bagels, doughnuts, rolls, buns, biscuits,white and wheat breads, pizza crust and traditional pasta made of white flour & egg white(vegetarian pasta or spinach or wheat pasta is OK).  Multi-grain bread is OK - like multi-grain flat bread or sandwich thins. Avoid alcohol in excess. Exercise is also important.    Eat all the vegetables you want - avoid meat, especially red meat and dairy - especially cheese.  Cheese is the most concentrated form of trans-fats which is the worst thing to clog up our arteries. Veggie cheese is OK which can be found in the fresh produce section at [employer] or [employer] or [employer]  +++++++++++++++++++ DASH Eating Plan  DASH stands for "Dietary Approaches to Stop Hypertension."   The DASH eating plan is a healthy eating plan that has been shown to reduce high blood pressure (hypertension). Additional health  benefits  may include reducing the risk of type 2 diabetes mellitus, heart disease, and stroke. The DASH eating plan may also help with weight loss. WHAT DO I NEED TO KNOW ABOUT THE DASH EATING PLAN? For the DASH eating plan, you will follow these general guidelines:  Choose foods with a percent daily value for sodium of less than 5% (as listed on the food label).  Use salt-free seasonings or herbs instead of table salt or sea salt.  Check with your health care provider or pharmacist before using salt substitutes.  Eat lower-sodium products, often labeled as "lower sodium" or "no salt added."  Eat fresh foods.  Eat more vegetables, fruits, and low-fat dairy products.  Choose whole grains. Look for the word "whole" as the first word in the ingredient list.  Choose fish   Limit sweets, desserts, sugars, and sugary drinks.  Choose heart-healthy fats.  Eat veggie cheese   Eat more home-cooked food and less restaurant, buffet, and fast food.  Limit fried foods.  Cook foods using methods other than frying.  Limit canned vegetables. If you do use them, rinse them well to decrease the sodium.  When eating at a restaurant, ask that your food be prepared with less salt, or no salt if possible.                      WHAT FOODS CAN I EAT? Read Dr Fara Olden Fuhrman's books on The End of Dieting & The End of Diabetes  Grains Whole grain or whole wheat bread. Brown rice. Whole grain or whole wheat pasta. Quinoa, bulgur, and whole grain cereals. Low-sodium cereals. Corn or whole wheat flour tortillas. Whole grain cornbread. Whole grain crackers. Low-sodium crackers.  Vegetables Fresh or frozen vegetables (raw, steamed, roasted, or grilled). Low-sodium or reduced-sodium tomato and vegetable juices. Low-sodium or reduced-sodium tomato sauce and paste. Low-sodium or reduced-sodium canned vegetables.   Fruits All fresh, canned (in natural juice), or frozen fruits.  Protein Products  All fish and  seafood.  Dried beans, peas, or lentils. Unsalted nuts and seeds. Unsalted canned beans.  Dairy Low-fat dairy products, such as skim or 1% milk, 2% or reduced-fat cheeses, low-fat ricotta or cottage cheese, or plain low-fat yogurt. Low-sodium or reduced-sodium cheeses.  Fats and Oils Tub margarines without trans fats. Light or reduced-fat mayonnaise and salad dressings (reduced sodium). Avocado. Safflower, olive, or canola oils. Natural peanut or almond butter.  Other Unsalted popcorn and pretzels. The items listed above may not be a complete list of recommended foods or beverages. Contact your dietitian for more options.  +++++++++++++++  WHAT FOODS ARE NOT RECOMMENDED? Grains/ White flour or wheat flour White bread. White pasta. White rice. Refined cornbread. Bagels and croissants. Crackers that contain trans fat.  Vegetables  Creamed or fried vegetables. Vegetables in a . Regular canned vegetables. Regular canned tomato sauce and paste. Regular tomato and vegetable juices.  Fruits Dried fruits. Canned fruit in light or heavy syrup. Fruit juice.  Meat and Other Protein Products Meat in general - RED meat & White meat.  Fatty cuts of meat. Ribs, chicken wings, all processed meats as bacon, sausage, bologna, salami, fatback, hot dogs, bratwurst and packaged luncheon meats.  Dairy Whole or 2% milk, cream, half-and-half, and cream cheese. Whole-fat or sweetened yogurt. Full-fat cheeses or blue cheese. Non-dairy creamers and whipped toppings. Processed cheese, cheese spreads, or cheese curds.  Condiments Onion and garlic salt, seasoned salt, table salt, and sea salt. Canned and packaged  gravies. Worcestershire sauce. Tartar sauce. Barbecue sauce. Teriyaki sauce. Soy sauce, including reduced sodium. Steak sauce. Fish sauce. Oyster sauce. Cocktail sauce. Horseradish. Ketchup and mustard. Meat flavorings and tenderizers. Bouillon cubes. Hot sauce. Tabasco sauce. Marinades. Taco seasonings.  Relishes.  Fats and Oils Butter, stick margarine, lard, shortening and bacon fat. Coconut, palm kernel, or palm oils. Regular salad dressings.  Pickles and olives. Salted popcorn and pretzels.  The items listed above may not be a complete list of foods and beverages to avoid.

## 2018-05-10 LAB — CBC WITH DIFFERENTIAL/PLATELET
Absolute Monocytes: 439 cells/uL (ref 200–950)
Basophils Absolute: 58 cells/uL (ref 0–200)
Basophils Relative: 0.8 %
Eosinophils Absolute: 58 cells/uL (ref 15–500)
Eosinophils Relative: 0.8 %
HCT: 38.3 % (ref 35.0–45.0)
Hemoglobin: 12.9 g/dL (ref 11.7–15.5)
Lymphs Abs: 2016 {cells}/uL (ref 850–3900)
MCH: 28.9 pg (ref 27.0–33.0)
MCHC: 33.7 g/dL (ref 32.0–36.0)
MCV: 85.7 fL (ref 80.0–100.0)
MPV: 10.4 fL (ref 7.5–12.5)
Monocytes Relative: 6.1 %
Neutro Abs: 4630 {cells}/uL (ref 1500–7800)
Neutrophils Relative %: 64.3 %
Platelets: 356 10*3/uL (ref 140–400)
RBC: 4.47 Million/uL (ref 3.80–5.10)
RDW: 12.8 % (ref 11.0–15.0)
Total Lymphocyte: 28 %
WBC: 7.2 Thousand/uL (ref 3.8–10.8)

## 2018-05-10 LAB — COMPLETE METABOLIC PANEL WITH GFR
AG Ratio: 1.8 (calc) (ref 1.0–2.5)
ALT: 46 U/L — ABNORMAL HIGH (ref 6–29)
CO2: 28 mmol/L (ref 20–32)
Chloride: 104 mmol/L (ref 98–110)
Creat: 0.74 mg/dL (ref 0.60–0.93)
GFR, Est African American: 94 mL/min/{1.73_m2} (ref >=60)
Potassium: 4.5 mmol/L (ref 3.5–5.3)
Total Bilirubin: 0.3 mg/dL (ref 0.2–1.2)
Total Protein: 7.1 g/dL (ref 6.1–8.1)

## 2018-05-10 LAB — COMPLETE METABOLIC PANEL WITHOUT GFR
AST: 27 U/L (ref 10–35)
Albumin: 4.6 g/dL (ref 3.6–5.1)
Alkaline phosphatase (APISO): 124 U/L (ref 37–153)
BUN: 17 mg/dL (ref 7–25)
Calcium: 10.1 mg/dL (ref 8.6–10.4)
GFR, Est Non African American: 82 mL/min/1.73m2 (ref >=60)
Globulin: 2.5 g/dL (ref 1.9–3.7)
Glucose, Bld: 86 mg/dL (ref 65–99)
Sodium: 142 mmol/L (ref 135–146)

## 2018-05-10 LAB — MAGNESIUM: Magnesium: 2.4 mg/dL (ref 1.5–2.5)

## 2018-05-10 LAB — VITAMIN D 25 HYDROXY (VIT D DEFICIENCY, FRACTURES): Vit D, 25-Hydroxy: 64 ng/mL (ref 30–100)

## 2018-05-10 LAB — LIPID PANEL
Cholesterol: 267 mg/dL — ABNORMAL HIGH (ref <200)
HDL: 75 mg/dL (ref >=50)
LDL Cholesterol (Calc): 176 mg/dL (calc) — ABNORMAL HIGH
Non-HDL Cholesterol (Calc): 192 mg/dL (calc) — ABNORMAL HIGH (ref <130)
Total CHOL/HDL Ratio: 3.6 (calc) (ref <5.0)
Triglycerides: 63 mg/dL (ref <150)

## 2018-05-10 LAB — HEMOGLOBIN A1C
Hgb A1c MFr Bld: 5.4 %{Hb} (ref <5.7)
Mean Plasma Glucose: 108 (calc)
eAG (mmol/L): 6 (calc)

## 2018-05-10 LAB — INSULIN, RANDOM: Insulin: 3.4 u[IU]/mL

## 2018-05-10 LAB — VITAMIN B12: Vitamin B-12: 1381 pg/mL — ABNORMAL HIGH (ref 200–1100)

## 2018-05-10 LAB — TSH: TSH: 1.51 m[IU]/L (ref 0.40–4.50)

## 2018-05-10 NOTE — Addendum Note (Signed)
Addended by: Mohammed Kindle on: 05/10/2018 09:44 AM   Modules accepted: Orders

## 2018-05-15 ENCOUNTER — Encounter: Payer: Self-pay | Admitting: Adult Health Nurse Practitioner

## 2018-05-30 ENCOUNTER — Encounter: Payer: Self-pay | Admitting: Gastroenterology

## 2018-05-30 ENCOUNTER — Ambulatory Visit: Payer: PPO | Admitting: Gastroenterology

## 2018-05-30 VITALS — BP 112/60 | HR 76 | Ht 62.0 in | Wt 111.0 lb

## 2018-05-30 DIAGNOSIS — R1013 Epigastric pain: Secondary | ICD-10-CM

## 2018-05-30 DIAGNOSIS — K219 Gastro-esophageal reflux disease without esophagitis: Secondary | ICD-10-CM | POA: Diagnosis not present

## 2018-05-30 MED ORDER — SUPREP BOWEL PREP KIT 17.5-3.13-1.6 GM/177ML PO SOLN: 1.0000 | ORAL | 0 refills | Status: DC

## 2018-05-30 MED ORDER — ESOMEPRAZOLE MAGNESIUM 20 MG PO CPDR: 20.0000 mg | DELAYED_RELEASE_CAPSULE | Freq: Every day | ORAL | 11 refills | Status: DC

## 2018-05-30 NOTE — Patient Instructions (Addendum)
If you are age 72 or older, your body mass index should be between 23-30. Your Body mass index is 20.3 kg/m. If this is out of the aforementioned range listed, please consider follow up with your Primary Care Provider.  If you are age 32 or younger, your body mass index should be between 19-25. Your Body mass index is 20.3 kg/m. If this is out of the aformentioned range listed, please consider follow up with your Primary Care Provider.   You have been scheduled for an endoscopy and colonoscopy. Please follow the written instructions given to you at your visit today. Please pick up your prep supplies at the pharmacy within the next 1-3 days. If you use inhalers (even only as needed), please bring them with you on the day of your procedure. Your physician has requested that you go to www.startemmi.[email or web address] and enter the access code given to you at your visit today. This web site gives a general overview about your procedure. However, you should still follow specific instructions given to you by our office regarding your preparation for the procedure.   We have sent the following medications to your pharmacy for you to pick up at your convenience: Nexium   Please make an appointment with Dr. Nani Ravens with [facility].   Two days before your procedure: Mix 3 packs (or capfuls) of Miralax in 48 ounces of clear liquid and drink at 6pm.   Thank you,  Dr. Jackquline Denmark

## 2018-05-30 NOTE — Progress Notes (Signed)
IMPRESSION and PLAN:   #1. Abdominal pain with bloating with constipation. 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 Neg CT 07/2015 except for redundant stool filled colon, negative EGD 07/2016 and colonoscopy 06/2013 except for TAs (rpt colon due 06/2018). Neg celiac screen and normal TSH. #2. IBS with predom constipation. #3. GERD and epigastric pain  Plan: - Stop IC-2 intestinal cleanser and super-enzymes. - Restart Nexium 20mg  po qd - Proceed with EGD/colon. Discussed risks & benefits. (Risks including rare perforation req laparotomy, bleeding after biopsies/polypectomy req blood transfusion, rare chance of missing neoplasms, risks of anesthesia/sedation). Benefits outweigh the risks. Patient agrees to proceed. All the questions were answered. Consent forms given for review. - Hold off on hydrogen breath test to rule out small bowel bacterial overgrowth. - FU 6 months. Earlier, if any problems.      HPI:    Chief Complaint:   Andrea Bates is a 73 y.o. female  Has been feeling better before Now I started having epigastric abdominal pain associated with reflux.  She has stopped taking Nexium previously as she got better. Denies having any odynophagia or dysphagia. No fever or chills  Has history of chronic constipation which is better currently.  She would like to get EGD at the same time as colonoscopy.    Past Medical History:  Diagnosis Date  . Chest pain    a. negative myoview 2011 [name]);  b. 04/2010 Cath: normal cors  . Colon polyps   . Diverticulosis   . GERD (gastroesophageal reflux disease)    a. mild esophagitis on egd 2012  . Hemorrhoids   . Hiatal hernia   . Hyperlipidemia   . Hyperlipidemia    Is now taking Fish oil. Had myalgias with Vytorin and Lipitor  . Hypothyroidism   . Irritable bowel syndrome   . Palpitations    a. 04/2008 echo: EF 50-60%;  b. 21 day event monitor 2012 - occasional sinus tachycardia with rates 110-120's. Rare PAC's/PVC's.  Serveral short 4-5 beat runs of a-tach. c. 06/2016: Event monitor showing PAC's, PVC's, and brief episodes of PAT.   Marland Kitchen Personal history of colonic polyps    a. colonoscopy 2009  . Personal history of malignant neoplasm of breast   . Pre-diabetes     Current Outpatient Medications  Medication Sig Dispense Refill  . cholecalciferol (VITAMIN D) 1000 UNITS tablet Take 5,000 Units by mouth daily.     . Coenzyme Q10 (COQ10) 100 MG CAPS Take 100 mg by mouth daily. Reported on 07/18/2015    . Digestive Enzymes (DIGESTIVE ENZYME ULTRA PO) Take 1 tablet by mouth daily.    . Esomeprazole Magnesium (NEXIUM 24HR PO) Take 1 tablet by mouth daily.    . fish oil-omega-3 fatty acids 1000 MG capsule Take 2 g by mouth daily. Reported on 07/18/2015    . Magnesium 200 MG TABS Take 1 tablet (200 mg total) by mouth 2 (two) times daily. 60 each 5  . mometasone (NASONEX) 50 MCG/ACT nasal spray Place 2 sprays into the nose daily.    Marland Kitchen SYNTHROID 50 MCG tablet Take 1 tablet daily for Thyroid - must have Office Visit before further Refills 90 tablet 1   Current Facility-Administered Medications  Medication Dose Route Frequency Provider Last Rate Last Dose  . 0.9 %  sodium chloride infusion  500 mL Intravenous Continuous Irene Shipper, MD        Past Surgical History:  Procedure Laterality Date  . BUNIONECTOMY    .  CARPAL TUNNEL RELEASE     bilateral   . COLONOSCOPY  07/04/2013   Polyp, Polypectomy. Moderate diverticulosis in L colon. BX: Tubular Adenoma no dyspasia or malignancy. 5 yr rpt colon  . NASAL SINUS SURGERY    . TONSILLECTOMY    . UPPER GASTROINTESTINAL ENDOSCOPY  08/09/2016   normal EGD  . UPPER GASTROINTESTINAL ENDOSCOPY  07/04/2013   normal EGD    Family History  Problem Relation Age of Onset  . Colon cancer Other        Paternal Great Unlce   . Diabetes Paternal Grandmother   . Heart defect Father   . Breast cancer Paternal Aunt     Social History   Tobacco Use  . Smoking status:  Never Smoker  . Smokeless tobacco: Never Used  Substance Use Topics  . Alcohol use: No  . Drug use: No    Allergies  Allergen Reactions  . Armour Thyroid [Thyroid]     Palpitations   . Atenolol     presyncope  . Celexa [Citalopram Hydrobromide]     Severe chest tightness   . Diltiazem Hcl     Dropped bp  . Levaquin [Levofloxacin In D5w] Nausea And Vomiting  . Lipitor [Atorvastatin]     Myalgias   . Propranolol Hcl     Felt bad  . Vytorin [Ezetimibe-Simvastatin]     Myalgias      Review of Systems: All systems reviewed and negative except where noted in HPI.    Physical Exam:     BP 112/60   Pulse 76   Ht 5\' 2"  (1.575 m)   Wt 111 lb (50.3 kg)   BMI 20.30 kg/m  @WEIGHTLAST3 @ GENERAL:  Alert, oriented, cooperative, not in acute distress. PSYCH: :Pleasant, normal mood and affect. HEENT:  conjunctiva pink, mucous membranes moist, neck supple without masses. No jaundice. CARDIAC:  S1 S2 normal. No murmers. PULM: Normal respiratory effort, lungs CTA bilaterally, no wheezing. ABDOMEN: Inspection: No visible peristalsis, no abnormal pulsations, skin normal.  Palpation/percussion: Soft, nontender, nondistended, no rigidity, no abnormal dullness to percussion, no hepatosplenomegaly and no palpable abdominal masses.  Auscultation: Normal bowel sounds, no abdominal bruits. Rectal exam: Deferred SKIN:  turgor, no lesions seen. Musculoskeletal:  Normal muscle tone, normal strength. NEURO: Alert and oriented x 3, no focal neurologic deficits.    Elzora Cullins,MD 05/30/2018, 2:13 PM   CC Unk Pinto, MD

## 2018-05-30 NOTE — Addendum Note (Signed)
Addended by: Karena Addison on: 05/30/2018 03:17 PM   Modules accepted: Orders

## 2018-06-02 ENCOUNTER — Ambulatory Visit: Payer: PPO | Admitting: Gastroenterology

## 2018-06-21 ENCOUNTER — Encounter: Payer: Self-pay | Admitting: Gastroenterology

## 2018-06-22 ENCOUNTER — Telehealth: Payer: Self-pay | Admitting: *Deleted

## 2018-06-22 NOTE — Telephone Encounter (Signed)
Called to cancel patient and left message on both home and mobile phone numbers.

## 2018-07-05 ENCOUNTER — Encounter: Payer: PPO | Admitting: Gastroenterology

## 2018-07-25 ENCOUNTER — Telehealth: Payer: Self-pay | Admitting: *Deleted

## 2018-07-25 NOTE — Telephone Encounter (Signed)
New Suprep instructions sent via MyChart

## 2018-07-25 NOTE — Telephone Encounter (Signed)
Called patient to reschedule postponed procedure.  Rescheduled for 5/29 @ 1030.  Will send new instructions via My Chart.

## 2018-08-09 ENCOUNTER — Telehealth: Payer: Self-pay | Admitting: Gastroenterology

## 2018-08-09 NOTE — Telephone Encounter (Signed)
Pt called requesting to speak with the nurse about her upcoming procd. She stated she had some additional questions to ask.

## 2018-08-10 NOTE — Telephone Encounter (Signed)
Patient called concerned about built up gas after upcoming endoscopic procedure. She already takes the following medications for gas: Phazyme maximum strength, beano, gas-x, and peppermints. She is wondering what you would recommend her to take other than the listed medications. She doesn't want to get home and have severe gas pain afterwards with no recommendations.

## 2018-08-15 NOTE — Telephone Encounter (Signed)
Patient informed. 

## 2018-08-15 NOTE — Telephone Encounter (Signed)
Good that you told me  -We will put minimal air during endoscopy.  We will also take the air out after colonoscopy. -She should take Gas-X 2 tablets, 2 hours before the procedure.

## 2018-08-16 ENCOUNTER — Telehealth: Payer: Self-pay

## 2018-08-16 NOTE — Telephone Encounter (Signed)
Covid-19 travel screening questions  Have you traveled in the last 14 days? No If yes where?  Do you now or have you had a fever in the last 14 days? No  Do you have any respiratory symptoms of shortness of breath or cough now or in the last 14 days? No  Do you have any family members or close contacts with diagnosed or suspected Covid-19? No       

## 2018-08-18 ENCOUNTER — Encounter: Payer: Self-pay | Admitting: Gastroenterology

## 2018-08-18 ENCOUNTER — Encounter: Payer: PPO | Admitting: Gastroenterology

## 2018-08-18 ENCOUNTER — Other Ambulatory Visit: Payer: Self-pay

## 2018-08-18 ENCOUNTER — Ambulatory Visit (AMBULATORY_SURGERY_CENTER): Payer: PPO | Admitting: Gastroenterology

## 2018-08-18 VITALS — BP 128/78 | HR 64 | Temp 98.2°F | Resp 16 | Ht 62.0 in | Wt 111.0 lb

## 2018-08-18 DIAGNOSIS — K2900 Acute gastritis without bleeding: Secondary | ICD-10-CM

## 2018-08-18 DIAGNOSIS — Z860100 Personal history of colon polyps, unspecified: Secondary | ICD-10-CM

## 2018-08-18 DIAGNOSIS — K219 Gastro-esophageal reflux disease without esophagitis: Secondary | ICD-10-CM | POA: Diagnosis not present

## 2018-08-18 DIAGNOSIS — K317 Polyp of stomach and duodenum: Secondary | ICD-10-CM | POA: Diagnosis not present

## 2018-08-18 DIAGNOSIS — D122 Benign neoplasm of ascending colon: Secondary | ICD-10-CM | POA: Diagnosis not present

## 2018-08-18 DIAGNOSIS — K2970 Gastritis, unspecified, without bleeding: Secondary | ICD-10-CM

## 2018-08-18 DIAGNOSIS — K319 Disease of stomach and duodenum, unspecified: Secondary | ICD-10-CM | POA: Diagnosis not present

## 2018-08-18 DIAGNOSIS — K449 Diaphragmatic hernia without obstruction or gangrene: Secondary | ICD-10-CM | POA: Diagnosis not present

## 2018-08-18 DIAGNOSIS — Z1211 Encounter for screening for malignant neoplasm of colon: Secondary | ICD-10-CM | POA: Diagnosis not present

## 2018-08-18 MED ORDER — SODIUM CHLORIDE 0.9 % IV SOLN: 500.0000 mL | Freq: Once | INTRAVENOUS | Status: DC

## 2018-08-18 NOTE — Progress Notes (Signed)
Called to room to assist during endoscopic procedure.  Patient ID and intended procedure confirmed with present staff. Received instructions for my participation in the procedure from the performing physician.  

## 2018-08-18 NOTE — Op Note (Signed)
[facility] Patient Name: Andrea Bates Procedure Date: 08/18/2018 10:54 AM MRN: [id number] Endoscopist: Jackquline Denmark , MD Age: 73 Referring MD:  Date of Birth: [birth year] Gender: Female Account #: [id number] Procedure:                Upper GI endoscopy Indications:              Epigastric abdominal pain Medicines:                Monitored Anesthesia Care Procedure:                Pre-Anesthesia Assessment:                           - Prior to the procedure, a History and Physical                            was performed, and patient medications and                            allergies were reviewed. The patient's tolerance of                            previous anesthesia was also reviewed. The risks                            and benefits of the procedure and the sedation                            options and risks were discussed with the patient.                            All questions were answered, and informed consent                            was obtained. Prior Anticoagulants: The patient has                            taken no previous anticoagulant or antiplatelet                            agents. ASA Grade Assessment: II - A patient with                            mild systemic disease. After reviewing the risks                            and benefits, the patient was deemed in                            satisfactory condition to undergo the procedure.                           After obtaining informed consent, the endoscope was  passed under direct vision. Throughout the                            procedure, the patient's blood pressure, pulse, and                            oxygen saturations were monitored continuously. The                            Endoscope was introduced through the and advanced                            to the second part of duodenum. The upper GI                            endoscopy was accomplished  without difficulty. The                            patient tolerated the procedure well. Scope In: Scope Out: Findings:                 The examined esophagus was normal.                           The Z-line was regular and was found 38 cm from the                            incisors.                           A 2 cm hiatal hernia was present. Biopsies were                            taken with a cold forceps for histology from antrum                            and body of the stomach. Estimated blood loss: none.                           The examined duodenum was normal. Biopsies for                            histology were taken with a cold forceps for                            evaluation of celiac disease. Estimated blood loss:                            none.                           A single 6 mm sessile polyp with no bleeding and no  stigmata of recent bleeding was found in the                            cardia. The polyp was removed with a cold biopsy                            forceps. Resection and retrieval were complete.                            Estimated blood loss: none. Complications:            No immediate complications. Estimated Blood Loss:     Estimated blood loss: none. Impression:               -Small hiatal hernia.                           -Gastric polyp status post polypectomy.                           -Otherwise normal EGD. Recommendation:           - Patient has a contact number available for                            emergencies. The signs and symptoms of potential                            delayed complications were discussed with the                            patient. Return to normal activities tomorrow.                            Written discharge instructions were provided to the                            patient.                           - Resume previous diet.                           - Continue present  medications.                           - Await pathology results. Jackquline Denmark, MD 08/18/2018 12:02:21 PM This report has been signed electronically.

## 2018-08-18 NOTE — Op Note (Addendum)
[facility] Patient Name: Andrea Bates Procedure Date: 08/18/2018 10:53 AM MRN: [id number] Endoscopist: Jackquline Denmark , MD Age: 73 Referring MD:  Date of Birth: 1945/12/22 Gender: Female Account #: [id number] Procedure:                Colonoscopy Indications:              High risk colon cancer surveillance: Personal                            history of colonic polyps 2004, 2008, 2009 and 2015. Medicines:                Monitored Anesthesia Care Procedure:                Pre-Anesthesia Assessment:                           - Prior to the procedure, a History and Physical                            was performed, and patient medications and                            allergies were reviewed. The patient's tolerance of                            previous anesthesia was also reviewed. The risks                            and benefits of the procedure and the sedation                            options and risks were discussed with the patient.                            All questions were answered, and informed consent                            was obtained. Prior Anticoagulants: The patient has                            taken no previous anticoagulant or antiplatelet                            agents. ASA Grade Assessment: II - A patient with                            mild systemic disease. After reviewing the risks                            and benefits, the patient was deemed in                            satisfactory condition to undergo the procedure.  After obtaining informed consent, the colonoscope                            was passed under direct vision. Throughout the                            procedure, the patient's blood pressure, pulse, and                            oxygen saturations were monitored continuously. The                            Colonoscope was introduced through the anus and                            advanced to  the 2 cm into the ileum. The                            colonoscopy was performed without difficulty. The                            patient tolerated the procedure well. The quality                            of the bowel preparation was adequate to identify                            polyps. The colon was highly redundant. Scope In: 11:28:12 AM Scope Out: 11:49:49 AM Scope Withdrawal Time: 0 hours 13 minutes 59 seconds  Total Procedure Duration: 0 hours 21 minutes 37 seconds  Findings:                 Two sessile polyps were found in the ascending                            colon. The polyps were 4 to 6 mm in size. These                            polyps were removed with a cold snare. Resection                            and retrieval were complete. Estimated blood loss:                            none.                           Multiple small-mouthed diverticula were found in                            the sigmoid colon.                           Non-bleeding internal hemorrhoids were found during  retroflexion. The hemorrhoids were small.                           The terminal ileum appeared normal.                           The exam was otherwise without abnormality on                            direct and retroflexion views. Complications:            No immediate complications. Estimated Blood Loss:     Estimated blood loss: none. Impression:               -Two 4 to 6 mm polyps in the ascending colon,                            removed with a cold snare. Resected and retrieved.                           -Moderate sigmoid diverticulosis.                           -Otherwise normal colonoscopy to TI. The colon was                            highly redundant. Recommendation:           - Patient has a contact number available for                            emergencies. The signs and symptoms of potential                            delayed complications were  discussed with the                            patient. Return to normal activities tomorrow.                            Written discharge instructions were provided to the                            patient.                           - Resume previous diet.                           - Continue present medications.                           - Await pathology results.                           - Repeat colonoscopy date to be determined after  pending pathology results are reviewed for                            surveillance of multiple polyps.                           - Return to GI clinic PRN.                           - Can use Preparation H on as-needed basis. Jackquline Denmark, MD 08/18/2018 12:05:53 PM This report has been signed electronically.

## 2018-08-18 NOTE — Patient Instructions (Addendum)
Handouts given for hiatal hernia, diverticulosis, hemorrhoids and polyps.  YOU HAD AN ENDOSCOPIC PROCEDURE TODAY AT [facility]:   Refer to the procedure report that was given to you for any specific questions about what was found during the examination.  If the procedure report does not answer your questions, please call your gastroenterologist to clarify.  If you requested that your care partner not be given the details of your procedure findings, then the procedure report has been included in a sealed envelope for you to review at your convenience later.  YOU SHOULD EXPECT: Some feelings of bloating in the abdomen. Passage of more gas than usual.  Walking can help get rid of the air that was put into your GI tract during the procedure and reduce the bloating. If you had a lower endoscopy (such as a colonoscopy or flexible sigmoidoscopy) you may notice spotting of blood in your stool or on the toilet paper. If you underwent a bowel prep for your procedure, you may not have a normal bowel movement for a few days.  Please Note:  You might notice some irritation and congestion in your nose or some drainage.  This is from the oxygen used during your procedure.  There is no need for concern and it should clear up in a day or so.  SYMPTOMS TO REPORT IMMEDIATELY:   Following lower endoscopy (colonoscopy or flexible sigmoidoscopy):  Excessive amounts of blood in the stool  Significant tenderness or worsening of abdominal pains  Swelling of the abdomen that is new, acute  Fever of 100F or higher   Following upper endoscopy (EGD)  Vomiting of blood or coffee ground material  New chest pain or pain under the shoulder blades  Painful or persistently difficult swallowing  New shortness of breath  Fever of 100F or higher  Black, tarry-looking stools  For urgent or emergent issues, a gastroenterologist can be reached at any hour by calling [phone].   DIET:  We do recommend a  small meal at first, but then you may proceed to your regular diet.  Drink plenty of fluids but you should avoid alcoholic beverages for 24 hours.  ACTIVITY:  You should plan to take it easy for the rest of today and you should NOT DRIVE or use heavy machinery until tomorrow (because of the sedation medicines used during the test).    FOLLOW UP: Our staff will call the number listed on your records 48-72 hours following your procedure to check on you and address any questions or concerns that you may have regarding the information given to you following your procedure. If we do not reach you, we will leave a message.  We will attempt to reach you two times.  During this call, we will ask if you have developed any symptoms of COVID 19. If you develop any symptoms (ie: fever, flu-like symptoms, shortness of breath, cough etc.) before then, please call [phone].  If you test positive for Covid 19 in the 2 weeks post procedure, please call and report this information to [place].    If any biopsies were taken you will be contacted by phone or by letter within the next 1-3 weeks.  Please call us at [phone] if you have not heard about the biopsies in 3 weeks.    SIGNATURES/CONFIDENTIALITY: You and/or your care partner have signed paperwork which will be entered into your electronic medical record.  These signatures attest to the fact that that the information above  on your After Visit Summary has been reviewed and is understood.  Full responsibility of the confidentiality of this discharge information lies with you and/or your care-partner.

## 2018-08-18 NOTE — Progress Notes (Signed)
A/ox3, pleased with MAC, report to RN 

## 2018-08-22 ENCOUNTER — Telehealth: Payer: Self-pay | Admitting: Physician Assistant

## 2018-08-22 ENCOUNTER — Telehealth: Payer: Self-pay

## 2018-08-22 NOTE — Telephone Encounter (Signed)
smartphone/ consent/ my chart/ pre reg completed °

## 2018-08-22 NOTE — Telephone Encounter (Signed)
  Follow up Call-  Call back number 08/18/2018 08/09/2016  Post procedure Call Back phone  # [phone]  Permission to leave phone message Yes Yes  Some recent data might be hidden     Patient questions:  Do you have a fever, pain , or abdominal swelling? No. Pain Score  0 *  Have you tolerated food without any problems? Yes.    Have you been able to return to your normal activities? Yes.    Do you have any questions about your discharge instructions: Diet   No. Medications  No. Follow up visit  No.  Do you have questions or concerns about your Care? No.  Actions: * If pain score is 4 or above: No action needed, pain <4. 1. Have you developed a fever since your procedure? no  2.   Have you had an respiratory symptoms (SOB or cough) since your procedure? no  3.   Have you tested positive for COVID 19 since your procedure no  4.   Have you had any family members/close contacts diagnosed with the COVID 19 since your procedure?  no   If yes to any of these questions please route to Joylene John, RN and Alphonsa Gin, Therapist, sports.

## 2018-08-23 ENCOUNTER — Encounter: Payer: Self-pay | Admitting: Gastroenterology

## 2018-08-23 ENCOUNTER — Other Ambulatory Visit: Payer: Self-pay

## 2018-08-23 ENCOUNTER — Telehealth (INDEPENDENT_AMBULATORY_CARE_PROVIDER_SITE_OTHER): Payer: PPO | Admitting: Gastroenterology

## 2018-08-23 VITALS — Ht 62.0 in | Wt 109.0 lb

## 2018-08-23 DIAGNOSIS — K5730 Diverticulosis of large intestine without perforation or abscess without bleeding: Secondary | ICD-10-CM | POA: Diagnosis not present

## 2018-08-23 DIAGNOSIS — K5900 Constipation, unspecified: Secondary | ICD-10-CM

## 2018-08-23 DIAGNOSIS — R1013 Epigastric pain: Secondary | ICD-10-CM

## 2018-08-23 DIAGNOSIS — K582 Mixed irritable bowel syndrome: Secondary | ICD-10-CM

## 2018-08-23 NOTE — Patient Instructions (Addendum)
You have been given a testing kit to check for small intestine bacterial overgrowth (SIBO) which is completed by a company named [facility]. Make sure to return your test in the mail using the return mailing label given you along with the kit. Your demographic and insurance information have already been sent to the company and they should be in contact with you over the next week regarding this test. Please keep in mind that you will be getting a call from phone number [phone] or a similar number. If you do not hear from them within this time frame, please call our office at [phone].   Please come to [place] 3 floor to pick up kit   Contiune Nexium 86m daily prn  Please call [place] [phone] schedule a 6 month follow up in December

## 2018-08-23 NOTE — Progress Notes (Signed)
IMPRESSION and PLAN:   #1. IBS with bloating/constipation/LUQ pain. 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 Neg CT 07/2015 except for redundant stool filled colon, neg EGD with SB Bx 08/2018 and colonoscopy 07/2018 except for TAs. Neg celiac screen and normal TSH. #2. History of tubular adenomas 07/2018. Next due 07/2023. #3. GERD with small HH.   Plan: - Can use Nexium 20mg  po qd on PRN basis. - Continue current medications including magnesium. - H2-breath test to r/o small bowel bacterial overgrowth(SBIO). - FU 6 months. Earlier, if any problems.  If with any further problems, would repeat CT abdo/pelvis.      HPI:    Chief Complaint:   Andrea Bates is a 73 y.o. female  For follow-up visit. Underwent EGD and colonoscopy on 08/18/2018  EGD showed small hiatal hernia, gastric fundic gland polyp.  Negative small bowel biopsies for celiac disease and negative gastric biopsies for HP.  Colonoscopy revealed 2 small sessile tubular adenomas, moderate sigmoid diverticulosis.  The colon was highly redundant.  She does feel better.  Still with abdominal bloating.  Would like to get hydrogen breath test.  She does not like to take Nexium and has stopped.  No fever or chills  Has history of chronic constipation which is better currently.  Has been having palpitations.  She will make appointment with Dr. Stanford Breed.      Past Medical History:  Diagnosis Date  . Chest pain    a. negative myoview 2011 [name]);  b. 04/2010 Cath: normal cors  . Colon polyps   . Diverticulosis   . GERD (gastroesophageal reflux disease)    a. mild esophagitis on egd 2012  . Hemorrhoids   . Hiatal hernia   . Hyperlipidemia   . Hyperlipidemia    Is now taking Fish oil. Had myalgias with Vytorin and Lipitor  . Hypothyroidism   . Irritable bowel syndrome   . Palpitations    a. 04/2008 echo: EF 50-60%;  b. 21 day event monitor 2012 - occasional sinus tachycardia with rates 110-120's. Rare PAC's/PVC's.  Serveral short 4-5 beat runs of a-tach. c. 06/2016: Event monitor showing PAC's, PVC's, and brief episodes of PAT.   Marland Kitchen Personal history of colonic polyps    a. colonoscopy 2009  . Personal history of malignant neoplasm of breast   . Pre-diabetes     Current Outpatient Medications  Medication Sig Dispense Refill  . cholecalciferol (VITAMIN D) 1000 UNITS tablet Take 5,000 Units by mouth daily.     . Coenzyme Q10 (COQ10) 100 MG CAPS Take 100 mg by mouth daily. Reported on 07/18/2015    . Digestive Enzymes (DIGESTIVE ENZYME ULTRA PO) Take 1 tablet by mouth daily.    . fish oil-omega-3 fatty acids 1000 MG capsule Take 1 g by mouth daily. Reported on 07/18/2015    . Magnesium 200 MG TABS Take 1 tablet (200 mg total) by mouth 2 (two) times daily. 60 each 5  . mometasone (NASONEX) 50 MCG/ACT nasal spray Place 2 sprays into the nose daily.    Marland Kitchen POTASSIUM CHLORIDE PO Take by mouth daily.     Marland Kitchen SYNTHROID 50 MCG tablet Take 1 tablet daily for Thyroid - must have Office Visit before further Refills 90 tablet 1  . esomeprazole (NEXIUM) 20 MG capsule Take 1 capsule (20 mg total) by mouth daily. (Patient not taking: Reported on 08/18/2018) 30 capsule 11   No current facility-administered medications for this visit.     Past Surgical History:  Procedure Laterality Date  . BUNIONECTOMY    . CARPAL TUNNEL RELEASE     bilateral   . COLONOSCOPY  07/04/2013   Polyp, Polypectomy. Moderate diverticulosis in L colon. BX: Tubular Adenoma no dyspasia or malignancy. 5 yr rpt colon  . NASAL SINUS SURGERY    . TONSILLECTOMY    . UPPER GASTROINTESTINAL ENDOSCOPY  08/09/2016   normal EGD  . UPPER GASTROINTESTINAL ENDOSCOPY  07/04/2013   normal EGD    Family History  Problem Relation Age of Onset  . Colon cancer Other        Paternal Great Unlce   . Diabetes Paternal Grandmother   . Heart defect Father   . Breast cancer Paternal Aunt     Social History   Tobacco Use  . Smoking status: Never Smoker  .  Smokeless tobacco: Never Used  Substance Use Topics  . Alcohol use: No  . Drug use: No    Allergies  Allergen Reactions  . Armour Thyroid [Thyroid]     Palpitations   . Atenolol     presyncope  . Celexa [Citalopram Hydrobromide]     Severe chest tightness   . Diltiazem Hcl     Dropped bp  . Levaquin [Levofloxacin In D5w] Nausea And Vomiting  . Lipitor [Atorvastatin]     Myalgias   . Propranolol Hcl     Felt bad  . Vytorin [Ezetimibe-Simvastatin]     Myalgias      Review of Systems: All systems reviewed and negative except where noted in HPI.    Physical Exam:     Ht 5\' 2"  (1.575 m)   Wt 109 lb (49.4 kg)   BMI 19.94 kg/m  @WEIGHTLAST3 @ Not examined since it was a tele-visit.   This service was provided via telemedicine.  The patient was located at home.  The provider was located in office.  The patient did consent to this telephone visit and is aware of possible charges through their insurance for this visit.   Time spent on call: 15 min     Clemencia Helzer,MD 08/23/2018, 4:36 PM   CC Unk Pinto, MD

## 2018-08-24 ENCOUNTER — Encounter: Payer: Self-pay | Admitting: Gastroenterology

## 2018-08-25 ENCOUNTER — Telehealth (INDEPENDENT_AMBULATORY_CARE_PROVIDER_SITE_OTHER): Payer: PPO | Admitting: Physician Assistant

## 2018-08-25 VITALS — BP 120/73 | HR 65 | Ht 62.0 in | Wt 107.0 lb

## 2018-08-25 DIAGNOSIS — R5383 Other fatigue: Secondary | ICD-10-CM | POA: Diagnosis not present

## 2018-08-25 DIAGNOSIS — R0602 Shortness of breath: Secondary | ICD-10-CM

## 2018-08-25 DIAGNOSIS — R002 Palpitations: Secondary | ICD-10-CM

## 2018-08-25 NOTE — Patient Instructions (Signed)
Medication Instructions:   Your physician recommends that you continue on your current medications as directed. Please refer to the Current Medication list given to you today.  If you need a refill on your cardiac medications before your next appointment, please call your pharmacy.   Lab work:  NONE ordered at this time of appointment    If you have labs (blood work) drawn today and your tests are completely normal, you will receive your results only by: Marland Kitchen MyChart Message (if you have MyChart) OR . A paper copy in the mail If you have any lab test that is abnormal or we need to change your treatment, we will call you to review the results.  Testing/Procedures:  Your physician has requested that you have an Echocardiogram. Echocardiography is a painless test that uses sound waves to create images of your heart. It provides your doctor with information about the size and shape of your heart and how well your heart's chambers and valves are working. This procedure takes approximately one hour. There are no restrictions for this procedure.  Follow-Up: At [facility], you and your health needs are our priority.  As part of our continuing mission to provide you with exceptional heart care, we have created designated Provider Care Teams.  These Care Teams include your primary Cardiologist (physician) and Advanced Practice Providers (APPs -  Physician Assistants and Nurse Practitioners) who all work together to provide you with the care you need, when you need it. Marland Kitchen You will need a follow up appointment with Kirk Ruths, MD after your ECHOCARDIOGRAM  Any Other Special Instructions Will Be Listed Below (If Applicable).

## 2018-08-25 NOTE — Progress Notes (Signed)
Virtual Visit via Telephone Note   This visit type was conducted due to national recommendations for restrictions regarding the COVID-19 Pandemic (e.g. social distancing) in an effort to limit this patient's exposure and mitigate transmission in our community.  Due to her co-morbid illnesses, this patient is at least at moderate risk for complications without adequate follow up.  This format is felt to be most appropriate for this patient at this time.  The patient did not have access to video technology/had technical difficulties with video requiring transitioning to audio format only (telephone).  All issues noted in this document were discussed and addressed.  No physical exam could be performed with this format.  Please refer to the patient's chart for her  consent to telehealth for [facility].   Date:  08/25/2018   ID:  Andrea Bates, DOB January 14, 1946, MRN [id number]  Patient Location: Home Provider Location: Office  PCP:  Unk Pinto, MD  Cardiologist:  Kirk Ruths, MD  Electrophysiologist:  None   Evaluation Performed:  Follow-Up Visit  Chief Complaint:  Palpitations   History of Present Illness:    Andrea Bates is a 73 y.o. female with PMH of hypertension, hyperlipidemia, and family history of CAD.  She had a cardiac catheterization in February 2012 that showed normal coronary arteries, and a normal LV function.  Stress echocardiogram in April 2015 was normal. Last echocardiogram obtained in May 2018 showed normal LV function, grade 1 DD, mildly dilated aortic root.  Heart monitor May 2018 showed sinus rhythm, PACs, PVCs and brief PAT. She is intolerant of beta-blocker and calcium channel blocker and also intolerant of statins.   Patient was last seen by Dr. Gregary Signs April 2019, at which time she continued to describe occasional palpitation.  She declined a trial on the beta-blocker at the time.  Her hyperlipidemia is managed by diet and exercise and did not wish to  try Zetia at the time.  The patient does not have symptoms concerning for COVID-19 infection (fever, chills, cough, or new shortness of breath).  She has been wearing a mask when she is out in public and social distancing.  Today she states she has noticed increased palpitations over the last 1 to 2 weeks.  She is also noticed some increased shortness of breath but, states that it is intermittent and she is still exercising 3-4 times a week.  She again noted that she did not tolerate beta blocking agents well.  She endorsed mild to moderate caffeine use, and consuming chocolate covered almonds.  She denies chest pain, lower extremity edema, orthopnea and PND.   Past Medical History:  Diagnosis Date  . Chest pain    a. negative myoview 2011 [name]);  b. 04/2010 Cath: normal cors  . Colon polyps   . Diverticulosis   . GERD (gastroesophageal reflux disease)    a. mild esophagitis on egd 2012  . Hemorrhoids   . Hiatal hernia   . Hyperlipidemia   . Hyperlipidemia    Is now taking Fish oil. Had myalgias with Vytorin and Lipitor  . Hypothyroidism   . Irritable bowel syndrome   . Palpitations    a. 04/2008 echo: EF 50-60%;  b. 21 day event monitor 2012 - occasional sinus tachycardia with rates 110-120's. Rare PAC's/PVC's. Serveral short 4-5 beat runs of a-tach. c. 06/2016: Event monitor showing PAC's, PVC's, and brief episodes of PAT.   Marland Kitchen Personal history of colonic polyps    a. colonoscopy 2009  . Personal history  of malignant neoplasm of breast   . Pre-diabetes    Past Surgical History:  Procedure Laterality Date  . BUNIONECTOMY    . CARPAL TUNNEL RELEASE     bilateral   . COLONOSCOPY  07/04/2013   Polyp, Polypectomy. Moderate diverticulosis in L colon. BX: Tubular Adenoma no dyspasia or malignancy. 5 yr rpt colon  . NASAL SINUS SURGERY    . TONSILLECTOMY    . UPPER GASTROINTESTINAL ENDOSCOPY  08/09/2016   normal EGD  . UPPER GASTROINTESTINAL ENDOSCOPY  07/04/2013   normal EGD      No outpatient medications have been marked as taking for the 08/25/18 encounter (Appointment) with Almyra Deforest, [place].     Allergies:   Armour thyroid [thyroid]; Atenolol; Celexa [citalopram hydrobromide]; Diltiazem hcl; Levaquin [levofloxacin in d5w]; Lipitor [atorvastatin]; Propranolol hcl; and Vytorin [ezetimibe-simvastatin]   Social History   Tobacco Use  . Smoking status: Never Smoker  . Smokeless tobacco: Never Used  Substance Use Topics  . Alcohol use: No  . Drug use: No     Family Hx: The patient's family history includes Breast cancer in her paternal aunt; Colon cancer in an other family member; Diabetes in her paternal grandmother; Heart defect in her father.  ROS:   Please see the history of present illness.     All other systems reviewed and are negative.   Prior CV studies:   The following studies were reviewed today:  Echo 08/13/2016 LV EF: 60% -   65%  ------------------------------------------------------------------- Indications:      Palpitation (R00.2).  ------------------------------------------------------------------- History:   PMH:   Syncope.  Risk factors:  Palpitation. Pre-diabetes. Dyslipidemia.  ------------------------------------------------------------------- Study Conclusions  - Left ventricle: The cavity size was normal. Wall thickness was   normal. Systolic function was normal. The estimated ejection   fraction was in the range of 60% to 65%. Wall motion was normal;   there were no regional wall motion abnormalities. Doppler   parameters are consistent with abnormal left ventricular   relaxation (grade 1 diastolic dysfunction). - Aortic valve: There was no stenosis. - Aorta: Mildly dilated aortic root and ascending aorta. Aortic   root dimension: 38 mm (ED). Ascending aortic diameter: 38 mm (S). - Mitral valve: There was no significant regurgitation. - Right ventricle: The cavity size was normal. Systolic function   was normal. -  Pulmonary arteries: No complete TR doppler jet so unable to   estimate PA systolic pressure. - Inferior vena cava: The vessel was normal in size. The   respirophasic diameter changes were in the normal range (>= 50%),   consistent with normal central venous pressure.  Impressions:  - Normal LV size with EF 60-65%. Normal RV size and systolic   function. No significant valvular abnormalities.    Labs/Other Tests and Data Reviewed:    EKG:  An ECG dated 04/07/2017 was personally reviewed today and demonstrated:  SR  Recent Labs: 05/09/2018: ALT 46; BUN 17; Creat 0.74; Hemoglobin 12.9; Magnesium 2.4; Platelets 356; Potassium 4.5; Sodium 142; TSH 1.51   Recent Lipid Panel Lab Results  Component Value Date/Time   CHOL 267 (H) 05/09/2018 02:47 PM   CHOL 276 (H) 06/10/2014 12:42 PM   TRIG 63 05/09/2018 02:47 PM   TRIG 61 06/10/2014 12:42 PM   HDL 75 05/09/2018 02:47 PM   HDL 80 06/10/2014 12:42 PM   CHOLHDL 3.6 05/09/2018 02:47 PM   LDLCALC 176 (H) 05/09/2018 02:47 PM   LDLCALC 184 (H) 06/10/2014 12:42 PM   LDLDIRECT  180.1 02/01/2013 04:08 PM    Wt Readings from Last 3 Encounters:  08/23/18 109 lb (49.4 kg)  08/18/18 111 lb (50.3 kg)  05/30/18 111 lb (50.3 kg)     Objective:    Vital Signs:  There were no vitals taken for this visit.   VITAL SIGNS:  reviewed  ASSESSMENT & PLAN:    1. Palpitations Ordered EKG to be done in the office in the next 1 to 2 weeks Ordered echocardiogram to be done in office in the next 1 to 2 weeks Educated about limiting caffeine and chocolate  2. Shortness of breath Ordered EKG to be done in the office in the next 1 to 2 weeks Ordered echocardiogram to be done in office in the next 1 to 2 weeks  3. Fatigue Continue physical activity as tolerated (patient states she is still walking 3-4 times a week around 1.5 miles per bout of activity) TSH reviewed, monitored by PCP CBC reviewed, monitored by PCP COVID-19 Education: The signs and  symptoms of COVID-19 were discussed with the patient and how to seek care for testing (follow up with PCP or arrange E-visit).  The importance of social distancing was discussed today.  Time:   Today, I have spent 15 minutes with the patient with telehealth technology discussing the above problems.     Medication Adjustments/Labs and Tests Ordered: Current medicines are reviewed at length with the patient today.  Concerns regarding medicines are outlined above.   Tests Ordered: No orders of the defined types were placed in this encounter.   Medication Changes: No orders of the defined types were placed in this encounter.   Disposition:  Follow up with Dr. Stanford Breed after echocardiogram is completed.  Signed, Coletta Memos, NP 08/25/2018 8:13 AM    [place]

## 2018-08-28 ENCOUNTER — Telehealth: Payer: Self-pay | Admitting: Gastroenterology

## 2018-08-28 ENCOUNTER — Ambulatory Visit (INDEPENDENT_AMBULATORY_CARE_PROVIDER_SITE_OTHER): Payer: PPO | Admitting: *Deleted

## 2018-08-28 ENCOUNTER — Other Ambulatory Visit: Payer: Self-pay

## 2018-08-28 DIAGNOSIS — R0602 Shortness of breath: Secondary | ICD-10-CM | POA: Diagnosis not present

## 2018-08-28 DIAGNOSIS — R002 Palpitations: Secondary | ICD-10-CM | POA: Diagnosis not present

## 2018-08-28 NOTE — Progress Notes (Signed)
1.) Reason for visit: EKG   2.) Name of MD requesting visit: H MENG PA  3.) Assessment and plan per MD: EKG done and reviewed by Dr Martin[place] DOD. Sinus rhythm with occasional PVC's. Continue current medications and get Echo as planned

## 2018-08-28 NOTE — Telephone Encounter (Signed)
Patient said to disregard previous msg. She said it was someone calling regarding her husband

## 2018-08-28 NOTE — Telephone Encounter (Signed)
Patient called said she is returning your call

## 2018-08-28 NOTE — Patient Instructions (Signed)
Your EKG was reviewed continue your current medications

## 2018-09-11 ENCOUNTER — Other Ambulatory Visit (HOSPITAL_COMMUNITY): Payer: PPO

## 2018-09-12 ENCOUNTER — Telehealth: Payer: Self-pay | Admitting: Cardiology

## 2018-09-12 NOTE — Telephone Encounter (Signed)
Spoke to patient . She states shse has been having dizziness for quite a while -  It occurs  Everyday. mainly from sitting to standing position.  No sudden moves cause the dizziness- patient states she just finish a z-pak. Patient states she has notiifed  primary - has not seen- had labwork about 3 month ago at primary. RN  Informed patient the test are done in separate building - would be glad to defer Dr Stanford Breed if carotid is needed.  Patient states she will wait until echo result are given to see what is next . She thanked the Rn for calling back.

## 2018-09-12 NOTE — Telephone Encounter (Signed)
New message   Patient is requesting to have a test done on her carotid arteries due to dizziness. Please advise.

## 2018-09-13 ENCOUNTER — Telehealth: Payer: PPO | Admitting: Cardiology

## 2018-09-14 ENCOUNTER — Telehealth (HOSPITAL_COMMUNITY): Payer: Self-pay | Admitting: Cardiology

## 2018-09-14 NOTE — Telephone Encounter (Signed)
Called to give echo appointment instructions. 

## 2018-09-14 NOTE — Telephone Encounter (Signed)

## 2018-09-15 ENCOUNTER — Ambulatory Visit (HOSPITAL_COMMUNITY): Payer: PPO | Attending: Internal Medicine

## 2018-09-15 ENCOUNTER — Other Ambulatory Visit: Payer: Self-pay

## 2018-09-15 DIAGNOSIS — R0602 Shortness of breath: Secondary | ICD-10-CM

## 2018-09-25 ENCOUNTER — Telehealth: Payer: Self-pay | Admitting: Cardiology

## 2018-09-25 NOTE — Telephone Encounter (Signed)
Spoke with pt who contacted office regarding results of recent echo for 6/26. Pt says they are available for her to review on mychart. Informed her that result note not yet available but she would be contacted with review of results and any recommendations. She verbalized understanding and stated that there were no issues with her mitral valve 2 years ago but the most recent results regarding her mitral valve were abnormal. She would like to know like to know if there is 'enough blood flow through her mitral valve and aorta'. 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 She states she is wondering if her 'aorta is slowly growing' since her last measurement was 74mm and the current is 2mm. She wanted to know if her aorta growing and her longer colon would cause there to be pressure put on her aorta? She states her mother has hx AAA and brother is currently being monitored for AAA. She would like to know if Dr. Stanford Breed would recommend any imaging to visualize aorta. Informed her that message would be routed to Dr. Stanford Breed and his nurse and she would be contacted with results and any recommendations

## 2018-09-25 NOTE — Telephone Encounter (Signed)
New message   Patient is calling to go over the results of echo test. Please call.

## 2018-09-25 NOTE — Telephone Encounter (Signed)
Echocardiogram showed normal LV function and mitral valve is okay.  Aortic root minimally dilated and not significantly changed.  Previous abdominal CT in 2017 showed no abdominal aortic aneurysm.  No need for concern at this point. Kirk Ruths, MD

## 2018-09-26 NOTE — Telephone Encounter (Signed)
Follow up ° ° °Patient is returning call per the previous message. Please call. °

## 2018-09-26 NOTE — Telephone Encounter (Signed)
Unable to reach pt or leave a message  

## 2018-09-26 NOTE — Telephone Encounter (Signed)
Spoke with pt, echo results discussed at length and with detail. Questions answered.

## 2018-10-02 DIAGNOSIS — J3489 Other specified disorders of nose and nasal sinuses: Secondary | ICD-10-CM | POA: Diagnosis not present

## 2018-10-04 DIAGNOSIS — K5900 Constipation, unspecified: Secondary | ICD-10-CM | POA: Diagnosis not present

## 2018-10-04 DIAGNOSIS — K582 Mixed irritable bowel syndrome: Secondary | ICD-10-CM | POA: Diagnosis not present

## 2018-10-04 DIAGNOSIS — R1013 Epigastric pain: Secondary | ICD-10-CM | POA: Diagnosis not present

## 2018-10-04 DIAGNOSIS — K5730 Diverticulosis of large intestine without perforation or abscess without bleeding: Secondary | ICD-10-CM | POA: Diagnosis not present

## 2018-10-10 DIAGNOSIS — Z20828 Contact with and (suspected) exposure to other viral communicable diseases: Secondary | ICD-10-CM | POA: Diagnosis not present

## 2018-10-11 DIAGNOSIS — J343 Hypertrophy of nasal turbinates: Secondary | ICD-10-CM | POA: Diagnosis not present

## 2018-10-11 DIAGNOSIS — J3489 Other specified disorders of nose and nasal sinuses: Secondary | ICD-10-CM | POA: Diagnosis not present

## 2018-10-16 ENCOUNTER — Telehealth: Payer: Self-pay | Admitting: Gastroenterology

## 2018-10-16 NOTE — Telephone Encounter (Signed)
Called and spoke with patient -patient reports she is wanting to know the results of SIBO test that she was informed that results had been faxed to Dr. Lyndel Safe- Fax has not been received-patient reports she is going to call and get them to refax the results to the office-alternative fax number of [phone] was given to the patient for fax; Patient verbalized understanding of information/instructions and was also advised to call back should further questions/concerns arise;  When fax received -results will be placed on Dr. Steve Rattler desk for review

## 2018-10-16 NOTE — Telephone Encounter (Signed)
Pt called inquiring about lab results. °

## 2018-10-16 NOTE — Telephone Encounter (Signed)
Please review results after you receive them

## 2018-10-18 ENCOUNTER — Telehealth: Payer: Self-pay | Admitting: Gastroenterology

## 2018-10-18 DIAGNOSIS — A049 Bacterial intestinal infection, unspecified: Secondary | ICD-10-CM

## 2018-10-18 NOTE — Telephone Encounter (Signed)
Called and spoke with patient-patient informed the results have been placed on Dr. Steve Rattler desk for review; patient advised as soon as orders/instructions received from Dr. Lyndel Safe she would receive a phone call from the office; patient verbalized understanding of information/instructions; patient also advised to all back to the office should questions/concerns arise;

## 2018-10-24 ENCOUNTER — Other Ambulatory Visit: Payer: Self-pay

## 2018-10-24 ENCOUNTER — Encounter: Payer: Self-pay | Admitting: Gastroenterology

## 2018-10-24 NOTE — Telephone Encounter (Signed)
Error

## 2018-10-25 NOTE — Progress Notes (Deleted)
MEDICARE ANNUAL WELLNESS VISIT AND FOLLOW UP  Assessment:    Hyperlipidemia Intolerant to statins, declines treatment   Palpitations Normal cath, normal stress echo, normal holter Still having palpations, states had some SOb this time, will follow up cardiology   PERSONAL HX BREAST CANCER OVER DUE FOR MGM, will get one   History of colonic polyps Up to date  Prediabetes Better with diet.   Medication management Continue medication   Hypothyroidism, unspecified hypothyroidism type will keep range closer to 4 due to anxiety/palpitations.   Constipation, unspecified constipation type Constipation- Increase fiber/ water intake, decrease caffeine, increase activity level, can add bulk (psyllium) until BM soft. Laboratory tests per orders. Please go to the hospital if you have severe abdominal pain, vomiting, fever, CP, SOB.    Dysphagia/GERD Has had normal EGD,  follow up GI.    Diverticulosis of large intestine without hemorrhage Normal colonoscopy, increase fiber   Anxiety LONG discussion about anxiety/depresssion,  did explain in full length/detail that all cardiac work ups have been negative and she is low risk She will however follow up with her cardiologist Suggesting seeing psych/counseling  Health Maintenance Patient declines, understands the risks of not getting the vaccine  Osteopenia Get DEXA   VERTIGO Normal neuro  Other fatigue -     Hepatic function panel -     CBC with Differential/Platelet -     BASIC METABOLIC PANEL WITH GFR  Medication management -     Hepatic function panel -     CBC with Differential/Platelet -     BASIC METABOLIC PANEL WITH GFR  Encounter for Medicare annual wellness exam  Elevated liver function Check labs, may need [place] if still elevated  Plan:   During the course of the visit the patient was educated and counseled about appropriate screening and preventive services including:    Pneumococcal vaccine   Influenza  vaccine  Td vaccine  Screening electrocardiogram  Screening mammography  Bone densitometry screening  Colorectal cancer screening  Diabetes screening  Glaucoma screening  Nutrition counseling   Advanced directives: given info/requested   Subjective:   Andrea Bates is a 73 y.o. female who presents for Medicare Annual Wellness Visit and 3 month follow up on hypertension, hyperlipidemia, vitamin D def.   Her blood pressure has been controlled at home, today their BP is   She does workout. She denies chest pain, shortness of breath, dizziness.   She has had extensive work ups from multiple doctors, has had Normal cath 04/2010, normal stress echo, normal holter, normal CT AB/pelvis/chest, normal colonoscopy/EGD, normal labs. She has high anxiety about her heart and cancer, declines treatment.  She is not on cholesterol medication and denies myalgias. Her cholesterol is at goal. The cholesterol last visit was:   Lab Results  Component Value Date   CHOL 267 (H) 05/09/2018   HDL 75 05/09/2018   LDLCALC 176 (H) 05/09/2018   LDLDIRECT 180.1 02/01/2013   TRIG 63 05/09/2018   CHOLHDL 3.6 05/09/2018   She has been working on diet and exercise for prediabetes, and denies paresthesia of the feet, polydipsia, polyuria and visual disturbances. Last A1C in the office was:  Lab Results  Component Value Date   HGBA1C 5.4 05/09/2018   Patient is on Vitamin D supplement. She has stopped her vitamins, B6 and states that she is doing better. Lab Results  Component Value Date   ALT 46 (H) 05/09/2018   AST 27 05/09/2018   ALKPHOS 102 07/15/2016  BILITOT 0.3 05/09/2018   She is on thyroid medication. Her medication was changed last visit, she is now on 56mcg 1/2 a pill a day, and did not take it for 3-4 days.   Lab Results  Component Value Date   TSH 1.51 05/09/2018  .  BMI is There is no height or weight on file to calculate BMI., she is working on diet and exercise. Wt Readings  from Last 3 Encounters:  08/25/18 107 lb (48.5 kg)  08/23/18 109 lb (49.4 kg)  08/18/18 111 lb (50.3 kg)    Medication Review Current Outpatient Medications on File Prior to Visit  Medication Sig Dispense Refill  . Cholecalciferol (VITAMIN D) 125 MCG (5000 UT) CAPS Take 1 capsule by mouth daily.    . Coenzyme Q10 (COQ10) 100 MG CAPS Take 100 mg by mouth daily. Reported on 07/18/2015    . Digestive Enzymes (DIGESTIVE ENZYME ULTRA PO) Take 1 tablet by mouth daily.    . finasteride (PROPECIA) 1 MG tablet Take 0.5 mg by mouth daily.    . fish oil-omega-3 fatty acids 1000 MG capsule Take 1 g by mouth daily. Reported on 07/18/2015    . Magnesium 200 MG TABS Take 1 tablet (200 mg total) by mouth 2 (two) times daily. 60 each 5  . mometasone (NASONEX) 50 MCG/ACT nasal spray Place 2 sprays into the nose daily.    Marland Kitchen POTASSIUM CHLORIDE PO Take 99 mg by mouth daily.     . Probiotic Product (PROBIOTIC DAILY PO) Take 1 tablet by mouth daily.    . Simethicone (PHAZYME MAXIMUM STRENGTH) 250 MG CAPS Take 1 capsule by mouth daily as needed.    Marland Kitchen SYNTHROID 50 MCG tablet Take 1 tablet daily for Thyroid - must have Office Visit before further Refills 90 tablet 1   No current facility-administered medications on file prior to visit.     Current Problems (verified) Patient Active Problem List   Diagnosis Date Noted  . Abdominal pain, epigastric 07/23/2015  . Gastroesophageal reflux disease without esophagitis 07/23/2015  . Globus sensation 07/23/2015  . Encounter for Medicare annual wellness exam 07/16/2015  . Flushing 02/28/2014  . Vitamin D deficiency 06/06/2013  . Medication management 06/06/2013  . Fatigue 02/02/2013  . Dysphagia 07/05/2010  . Constipation 07/05/2010  . VERTIGO 06/08/2010  . Palpitations 06/02/2010  . Syncope (VasoVagal Faint) 04/27/2010  . Hypothyroidism 03/31/2009  . Diverticulosis of large intestine 03/31/2009  . PERSONAL HX BREAST CANCER 03/31/2009  . History of colonic  polyps 03/31/2009  . Hyperlipidemia 05/07/2008    Screening Tests Immunization History  Administered Date(s) Administered  . DT 12/11/2014  . PPD Test 12/11/2014  . Td 11/19/2002   Preventative care: Last colonoscopy: 2015 EGD 2015 Last mammogram: OVER DUE Last pap smear/pelvic exam: 2 years  DEXA: OVER DUE Echo 06/2013 normal Holter monitor x 2 AB [place] 2008 CT AB/PELVIS- 07/2013 CT chest 2012 Gastric emptying 2012  Prior vaccinations: Patient declines, understands the risks of not getting the vaccine  Names of Other Physician/Practitioners you currently use: 1. [facility]- here for primary care 2. [facility], eye doctor, last visit Feb 2017 3. Dr. Nicki Reaper, dentist, last visit 4 years ago Patient Care Team: Unk Pinto, MD as PCP - General (Internal Medicine) Stanford Breed Denice Bors, MD as PCP - Cardiology (Cardiology) Irene Shipper, MD as Consulting Physician (Gastroenterology) Larey Dresser, MD as Consulting Physician (Cardiology)  Allergies Allergies  Allergen Reactions  . Iodine Palpitations  .  Armour Thyroid [Thyroid]     Palpitations   . Atenolol     presyncope  . Celexa [Citalopram Hydrobromide]     Severe chest tightness   . Diltiazem Hcl     Dropped bp  . Levaquin [Levofloxacin In D5w] Nausea And Vomiting  . Lipitor [Atorvastatin]     Myalgias   . Propranolol Hcl     Felt bad  . Vytorin [Ezetimibe-Simvastatin]     Myalgias     SURGICAL HISTORY She  has a past surgical history that includes Nasal sinus surgery; Tonsillectomy; Carpal tunnel release; Colonoscopy (07/04/2013); Bunionectomy; Upper gastrointestinal endoscopy (08/09/2016); and Upper gastrointestinal endoscopy (07/04/2013). FAMILY HISTORY Her family history includes Breast cancer in her paternal aunt; Colon cancer in an other family member; Diabetes in her paternal grandmother; Heart defect in her father. SOCIAL HISTORY She  reports that she  has never smoked. She has never used smokeless tobacco. She reports that she does not drink alcohol or use drugs. MEDICARE WELLNESS OBJECTIVES: Physical activity:   Cardiac risk factors:   Depression/mood screen:   Depression screen [facility] 2/9 07/15/2016  Decreased Interest 0  Down, Depressed, Hopeless 0  PHQ - 2 Score 0    ADLs:  No flowsheet data found.   Cognitive Testing  Alert? Yes  Normal Appearance?Yes  Oriented to person? Yes  Place? Yes   Time? Yes  Recall of three objects?  Yes  Can perform simple calculations? Yes  Displays appropriate judgment?Yes  Can read the correct time from a watch face?Yes  EOL planning:      Objective:   There were no vitals taken for this visit. There is no height or weight on file to calculate BMI.  General appearance: alert, no distress, WD/WN,  female HEENT: normocephalic, sclerae anicteric, TMs pearly, nares patent, no discharge or erythema, pharynx normal Oral cavity: MMM, no lesions Neck: supple, no lymphadenopathy, no thyromegaly, no masses Heart: RRR, normal S1, S2, no murmurs Lungs: CTA bilaterally, no wheezes, rhonchi, or rales Abdomen: +bs, soft, + mild epigatric tenderness no rebound, non distended, no masses, no hepatomegaly, no splenomegaly Musculoskeletal: nontender, no swelling, no obvious deformity Extremities: no edema, no cyanosis, no clubbing Pulses: 2+ symmetric, upper and lower extremities, normal cap refill Neurological: alert, oriented x 3, CN2-12 intact, strength normal upper extremities and lower extremities, sensation normal throughout, DTRs 2+ throughout, no cerebellar signs, gait normal Psychiatric: normal affect, tearful when talking about anxiety.  Breast: defer Gyn: defer Rectal: defer   Medicare Attestation I have personally reviewed: The patient's medical and social history Their use of alcohol, tobacco or illicit drugs Their current medications and supplements The patient's functional ability  including ADLs,fall risks, home safety risks, cognitive, and hearing and visual impairment Diet and physical activities Evidence for depression or mood disorders  The patient's weight, height, BMI, and visual acuity have been recorded in the chart.  I have made referrals, counseling, and provided education to the patient based on review of the above and I have provided the patient with a written personalized care plan for preventive services.     Vicie Mutters, PA-C   10/25/2018   Future Appointments  Date Time Provider Parole  10/31/2018  9:00 AM Vicie Mutters, PA-C [facility] None  10/31/2018  9:45 AM Shelda Pal, DO [facility]  11/05/2019  9:00 AM Vicie Mutters, PA-C [facility] None

## 2018-10-26 NOTE — Telephone Encounter (Signed)
Pt requested an update on SIBO results.

## 2018-10-27 NOTE — Telephone Encounter (Signed)
Please advise on SIBO results per patient request;

## 2018-10-30 NOTE — Telephone Encounter (Signed)
Have discussed the results of SIBO (hydrogen and retained breath test) with the patient in detail. 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  Plan: -Rifaximin 550 mg p.o. 3 times daily x 2 weeks. No refills. Bre, if there is problem getting the medication or if it is very expensive, get in touch with the drug rep (I believe it is Amie [place]).  Please ask Larena Glassman. -Pt to call us in 3-4 weeks to let us know how she is doing.  Thx  RG

## 2018-10-30 NOTE — Telephone Encounter (Signed)
Pt called again regarding SIBO results. She stated that she is still having stomach pain. Pls call her.

## 2018-10-30 NOTE — Telephone Encounter (Signed)
Please advise on results of SIBO test that were placed on your desk for review;

## 2018-10-31 ENCOUNTER — Ambulatory Visit: Payer: PPO | Admitting: Family Medicine

## 2018-10-31 ENCOUNTER — Encounter: Payer: Self-pay | Admitting: Physician Assistant

## 2018-10-31 ENCOUNTER — Encounter: Payer: Self-pay | Admitting: Adult Health

## 2018-10-31 MED ORDER — RIFAXIMIN 550 MG PO TABS: 550.0000 mg | ORAL_TABLET | Freq: Three times a day (TID) | ORAL | 0 refills | Status: AC

## 2018-10-31 NOTE — Telephone Encounter (Signed)
RX sent to [facility] for prior authorization attempts to get the medication covered by insurance; information supporting request faxed to [facility]; awaiting response for medication PA;

## 2018-10-31 NOTE — Telephone Encounter (Signed)
RN to notify patient when response is known from [facility]

## 2018-11-01 NOTE — Telephone Encounter (Signed)
Received notification from [facility]-medication does not require PA from patinet's insurance-[facility] is ensuring the patient can afford the medication-will notify office is further information/instructions/assistance is needed

## 2018-11-02 ENCOUNTER — Telehealth: Payer: Self-pay | Admitting: Gastroenterology

## 2018-11-02 ENCOUNTER — Other Ambulatory Visit: Payer: Self-pay | Admitting: Adult Health Nurse Practitioner

## 2018-11-02 DIAGNOSIS — R1013 Epigastric pain: Secondary | ICD-10-CM

## 2018-11-02 DIAGNOSIS — R110 Nausea: Secondary | ICD-10-CM

## 2018-11-02 DIAGNOSIS — E039 Hypothyroidism, unspecified: Secondary | ICD-10-CM

## 2018-11-02 NOTE — Telephone Encounter (Signed)
Please review patient message and advise 

## 2018-11-03 NOTE — Telephone Encounter (Signed)
Lets hold off on probiotics while she taking rifaximin to determine the response. No need to be on a special diet for now. She needs to avoid high fructose corn syrup foods like sodas etc  RG

## 2018-11-06 NOTE — Telephone Encounter (Signed)
Left message for patient to call back  

## 2018-11-07 NOTE — Telephone Encounter (Signed)
Pt returned your call.  

## 2018-11-09 NOTE — Telephone Encounter (Signed)
She should almost be done with antibiotics Will get better once she is off antibiotics Can call in Zofran 4 mg ODT every 8 hours as needed for nausea, 20, 2 refills  RG

## 2018-11-09 NOTE — Telephone Encounter (Signed)
Called and spoke with the patient-patient reports she will not take any probiotic while taking the antibiotic (she just started taking the antibx as of yesterday); Please advise if a medication can be prescribed for her nausea with taking this antibx as she is eating when taking the antibx but still feeling very nauseated  (no vomiting);

## 2018-11-09 NOTE — Telephone Encounter (Signed)
Left message for patient to call back to the office;  

## 2018-11-10 MED ORDER — ONDANSETRON 4 MG PO TBDP: 4.0000 mg | ORAL_TABLET | Freq: Three times a day (TID) | ORAL | 2 refills | Status: DC | PRN

## 2018-11-10 NOTE — Telephone Encounter (Signed)
Called and spoke with patient-patient informed of MD recommendations; patient is agreeable with plan of care and was advised her RX will be sent to pharmacy of choice; Patient verbalized understanding of information/instructions;  Patient was advised to call the office at [phone] if questions/concerns arise;

## 2018-11-19 ENCOUNTER — Other Ambulatory Visit: Payer: Self-pay | Admitting: Internal Medicine

## 2018-11-19 DIAGNOSIS — E039 Hypothyroidism, unspecified: Secondary | ICD-10-CM

## 2018-11-28 NOTE — Progress Notes (Signed)
MEDICARE ANNUAL WELLNESS VISIT AND FOLLOW UP  Assessment:   Encounter for Medicare annual wellness exam   Hyperlipidemia Intolerant to statins, declines treatment  Had low risk cardiac workup Continue low cholesterol diet and exercise.  Check lipid panel.    PERSONAL HX BREAST CANCER OVER DUE FOR MGM, will get one - order entered, patient will schedule   History of colonic polyps Up to date  Prediabetes Better with diet.   Medication management Continue medication   Hypothyroidism, unspecified hypothyroidism type will keep range closer to 4 due to anxiety/palpitations.   Constipation, unspecified constipation type Followed closely by GI- Increase fiber/ water intake, decrease caffeine, increase activity level, can add bulk (psyllium) until BM soft. Laboratory tests per orders. Please go to the hospital if you have severe abdominal pain, vomiting, fever, CP, SOB.    Diverticulosis of large intestine without hemorrhage Normal colonoscopy, increase fiber  Health Maintenance Patient declines, understands the risks of not getting the vaccine  Osteopenia Declines dexa; would not take bisphosphonates Continue vitamin D, high calcium diet Encouraged high impact/resistance exercises  VERTIGO Normal neuro Improved with allergy treatment  Medication management -     CBC with Differential/Platelet -     CMP/GFR WITH GFR  Elevated liver function Recent normal imaging, CT/US in 2017, neg hep C, Mild, recent imaging abd 2018 unremarkable  Future Appointments  Date Time Provider [place]  12/12/2018  3:00 PM Jackquline Denmark, MD [facility]     Plan:   During the course of the visit the patient was educated and counseled about appropriate screening and preventive services including:    Pneumococcal vaccine   Influenza vaccine  Td vaccine  Screening electrocardiogram  Screening mammography  Bone densitometry screening  Colorectal cancer  screening  Diabetes screening  Glaucoma screening  Nutrition counseling   Advanced directives: given info/requested   Subjective:   Andrea Bates is a 73 y.o. female who presents for Medicare Annual Wellness Visit and 3 month follow up on hypertension, hyperlipidemia, vitamin D def.   Her blood pressure has been controlled at home, today their BP is BP: 138/72 She does workout. She denies chest pain, shortness of breath, dizziness.    She has had extensive work ups from multiple doctors, has had Normal cath 04/2010, normal stress echo, normal holter, normal CT AB/pelvis/chest, normal colonoscopy (excepting polyps, following q5y/EGD, normal labs. She has high anxiety about her heart and cancer, declines treatment.   She follows with Dr. Lyndel Safe with [facility] for ongoing GI problems, reports hx of IBS, recent colonoscopy showed benign polyps and mild diverticula. She underwent SIBOW and placed on xifaxin, has follow up for possible repeat exam. She does report symptoms overall are significantly improved other than ongoing chronic constipation and bloating.   She is not on cholesterol medication (hx of intolerance with numerous cholesterol medications, declines further trial) and denies myalgias. Her cholesterol is not at goal. The cholesterol last visit was:   Lab Results  Component Value Date   CHOL 267 (H) 05/09/2018   HDL 75 05/09/2018   LDLCALC 176 (H) 05/09/2018   LDLDIRECT 180.1 02/01/2013   TRIG 63 05/09/2018   CHOLHDL 3.6 05/09/2018   She has been working on diet and exercise for glucose management, and denies paresthesia of the feet, polydipsia, polyuria and visual disturbances. Last A1C in the office was:  Lab Results  Component Value Date   HGBA1C 5.4 05/09/2018   Patient is on Vitamin D supplement.   Lab Results  Component Value Date   VD25OH 64 05/09/2018     Lab Results  Component Value Date   ALT 46 (H) 05/09/2018   AST 27 05/09/2018   ALKPHOS 102  07/15/2016   BILITOT 0.3 05/09/2018   She is on thyroid medication. Her medication was changed last visit, she is now on 75mcg 1/2 a pill a day, and did not take it for 3-4 days.   Lab Results  Component Value Date   TSH 1.51 05/09/2018  .  BMI is Body mass index is 20.12 kg/m., she is working on diet and exercise. Wt Readings from Last 3 Encounters:  11/29/18 110 lb (49.9 kg)  08/25/18 107 lb (48.5 kg)  08/23/18 109 lb (49.4 kg)    Medication Review Current Outpatient Medications on File Prior to Visit  Medication Sig Dispense Refill  . Cholecalciferol (VITAMIN D) 125 MCG (5000 UT) CAPS Take 1 capsule by mouth daily.    . Coenzyme Q10 (COQ10) 100 MG CAPS Take 100 mg by mouth daily. Reported on 07/18/2015    . Digestive Enzymes (DIGESTIVE ENZYME ULTRA PO) Take 1 tablet by mouth daily.    . finasteride (PROPECIA) 1 MG tablet Take 0.5 mg by mouth daily.    . fish oil-omega-3 fatty acids 1000 MG capsule Take 1 g by mouth daily. Reported on 07/18/2015    . Magnesium 200 MG TABS Take 1 tablet (200 mg total) by mouth 2 (two) times daily. 60 each 5  . mometasone (NASONEX) 50 MCG/ACT nasal spray Place 2 sprays into the nose daily.    Marland Kitchen POTASSIUM CHLORIDE PO Take 99 mg by mouth daily.     . Simethicone (PHAZYME MAXIMUM STRENGTH) 250 MG CAPS Take 1 capsule by mouth daily as needed.    . ondansetron (ZOFRAN ODT) 4 MG disintegrating tablet Take 1 tablet (4 mg total) by mouth every 8 (eight) hours as needed for nausea or vomiting. (Patient not taking: Reported on 11/29/2018) 20 tablet 2  . Probiotic Product (PROBIOTIC DAILY PO) Take 1 tablet by mouth daily.     No current facility-administered medications on file prior to visit.     Current Problems (verified) Patient Active Problem List   Diagnosis Date Noted  . Gastroesophageal reflux disease without esophagitis 07/23/2015  . Vitamin D deficiency 06/06/2013  . Medication management 06/06/2013  . Dysphagia 07/05/2010  . Constipation  07/05/2010  . VERTIGO 06/08/2010  . Hypothyroidism 03/31/2009  . Diverticulosis of large intestine 03/31/2009  . PERSONAL HX BREAST CANCER 03/31/2009  . History of colonic polyps 03/31/2009  . Hyperlipidemia 05/07/2008    Screening Tests Immunization History  Administered Date(s) Administered  . DT 12/11/2014  . PPD Test 12/11/2014  . Td 11/19/2002   Preventative care: Last colonoscopy: 2015, 07/2018, q5y EGD 07/2018 Last mammogram: OVER DUE, last by GYN and remote, will order, patient will call to schedule  Last pap smear/pelvic exam: 2 years  DEXA: declines, would not take bisphosphonates  Echo  08/2018 LEF 60-65% Holter monitor x 2 AB [place] 2008 CT AB/PELVIS- 07/2013 CT chest 2012 Gastric emptying 2012  Prior vaccinations: Patient declines, understands the risks of not getting the vaccine  Names of Other Physician/Practitioners you currently use: 1. [facility]- here for primary care 2. [facility], eye doctor, last visit 2019 3. Dr. Gae Bon, dentist, last visit ? 2018  Patient Care Team: Unk Pinto, MD as PCP - General (Internal Medicine) Stanford Breed Denice Bors, MD as PCP - Cardiology (  Cardiology) Larey Dresser, MD as Consulting Physician (Cardiology) Jackquline Denmark, MD as Consulting Physician (Gastroenterology)  Allergies Allergies  Allergen Reactions  . Iodine Palpitations  . Armour Thyroid [Thyroid]     Palpitations   . Atenolol     presyncope  . Celexa [Citalopram Hydrobromide]     Severe chest tightness   . Diltiazem Hcl     Dropped bp  . Levaquin [Levofloxacin In D5w] Nausea And Vomiting  . Lipitor [Atorvastatin]     Myalgias   . Propranolol Hcl     Felt bad  . Vytorin [Ezetimibe-Simvastatin]     Myalgias     SURGICAL HISTORY She  has a past surgical history that includes Nasal sinus surgery; Tonsillectomy; Carpal tunnel release; Colonoscopy (07/04/2013); Bunionectomy; Upper  gastrointestinal endoscopy (08/09/2016); and Upper gastrointestinal endoscopy (07/04/2013). FAMILY HISTORY Her family history includes Breast cancer in her paternal aunt; Colon cancer in an other family member; Diabetes in her paternal grandmother; Heart defect in her father. SOCIAL HISTORY She  reports that she has never smoked. She has never used smokeless tobacco. She reports that she does not drink alcohol or use drugs. MEDICARE WELLNESS OBJECTIVES: Physical activity: Current Exercise Habits: Home exercise routine, Type of exercise: walking, Time (Minutes): 25, Frequency (Times/Week): 4, Weekly Exercise (Minutes/Week): 100, Intensity: Mild, Exercise limited by: None identified Cardiac risk factors: Cardiac Risk Factors include: advanced age (>37men, >33 women);dyslipidemia;hypertension Depression/mood screen:   Depression screen [facility] 2/9 11/29/2018  Decreased Interest 0  Down, Depressed, Hopeless 0  PHQ - 2 Score 0    ADLs:  In your present state of health, do you have any difficulty performing the following activities: 11/29/2018  Hearing? N  Vision? N  Difficulty concentrating or making decisions? N  Walking or climbing stairs? N  Dressing or bathing? N  Doing errands, shopping? N  Some recent data might be hidden     Cognitive Testing  Alert? Yes  Normal Appearance?Yes  Oriented to person? Yes  Place? Yes   Time? Yes  Recall of three objects?  Yes  Can perform simple calculations? Yes  Displays appropriate judgment?Yes  Can read the correct time from a watch face?Yes  EOL planning: Does Patient Have a Medical Advance Directive?: No Would patient like information on creating a medical advance directive?: No - Patient declined    Objective:   Blood pressure 138/72, pulse 66, temperature 97.8 F (36.6 C), height 5\' 2"  (1.575 m), weight 110 lb (49.9 kg), SpO2 99 %. Body mass index is 20.12 kg/m.  General appearance: alert, no distress, WD/WN,  female HEENT: normocephalic,  sclerae anicteric, TMs pearly, nares patent, no discharge or erythema, pharynx normal Oral cavity: MMM, no lesions Neck: supple, no lymphadenopathy, no thyromegaly, no masses Heart: RRR, normal S1, S2, no murmurs Lungs: CTA bilaterally, no wheezes, rhonchi, or rales Abdomen: +bs, soft, + mild epigatric tenderness no rebound, non distended, no masses, no hepatomegaly, no splenomegaly Musculoskeletal: nontender, no swelling, no obvious deformity Extremities: no edema, no cyanosis, no clubbing Pulses: 2+ symmetric, upper and lower extremities, normal cap refill Neurological: alert, oriented x 3, CN2-12 intact, strength normal upper extremities and lower extremities, sensation normal throughout, DTRs 2+ throughout, no cerebellar signs, gait normal Psychiatric: normal affect, tearful when talking about anxiety.  Breast: defer Gyn: defer Rectal: defer   Medicare Attestation I have personally reviewed: The patient's medical and social history Their use of alcohol, tobacco or illicit drugs Their current medications and supplements The patient's functional ability including ADLs,fall risks,  home safety risks, cognitive, and hearing and visual impairment Diet and physical activities Evidence for depression or mood disorders  The patient's weight, height, BMI, and visual acuity have been recorded in the chart.  I have made referrals, counseling, and provided education to the patient based on review of the above and I have provided the patient with a written personalized care plan for preventive services.     Izora Ribas, NP   11/29/2018   Future Appointments  Date Time Provider [place]  12/12/2018  3:00 PM Jackquline Denmark, MD [facility]

## 2018-11-29 ENCOUNTER — Encounter: Payer: Self-pay | Admitting: Adult Health

## 2018-11-29 ENCOUNTER — Ambulatory Visit (INDEPENDENT_AMBULATORY_CARE_PROVIDER_SITE_OTHER): Payer: PPO | Admitting: Adult Health

## 2018-11-29 ENCOUNTER — Other Ambulatory Visit: Payer: Self-pay

## 2018-11-29 VITALS — BP 138/72 | HR 66 | Temp 97.8°F | Ht 62.0 in | Wt 110.0 lb

## 2018-11-29 DIAGNOSIS — Z86010 Personal history of colonic polyps: Secondary | ICD-10-CM | POA: Diagnosis not present

## 2018-11-29 DIAGNOSIS — R6889 Other general symptoms and signs: Secondary | ICD-10-CM

## 2018-11-29 DIAGNOSIS — Z0000 Encounter for general adult medical examination without abnormal findings: Secondary | ICD-10-CM

## 2018-11-29 DIAGNOSIS — K219 Gastro-esophageal reflux disease without esophagitis: Secondary | ICD-10-CM

## 2018-11-29 DIAGNOSIS — K5900 Constipation, unspecified: Secondary | ICD-10-CM | POA: Diagnosis not present

## 2018-11-29 DIAGNOSIS — Z79899 Other long term (current) drug therapy: Secondary | ICD-10-CM

## 2018-11-29 DIAGNOSIS — E559 Vitamin D deficiency, unspecified: Secondary | ICD-10-CM

## 2018-11-29 DIAGNOSIS — E039 Hypothyroidism, unspecified: Secondary | ICD-10-CM

## 2018-11-29 DIAGNOSIS — E7800 Pure hypercholesterolemia, unspecified: Secondary | ICD-10-CM

## 2018-11-29 DIAGNOSIS — Z853 Personal history of malignant neoplasm of breast: Secondary | ICD-10-CM

## 2018-11-29 DIAGNOSIS — R42 Dizziness and giddiness: Secondary | ICD-10-CM

## 2018-11-29 DIAGNOSIS — K5730 Diverticulosis of large intestine without perforation or abscess without bleeding: Secondary | ICD-10-CM

## 2018-11-29 DIAGNOSIS — Z0001 Encounter for general adult medical examination with abnormal findings: Secondary | ICD-10-CM

## 2018-11-29 DIAGNOSIS — Z1239 Encounter for other screening for malignant neoplasm of breast: Secondary | ICD-10-CM

## 2018-11-29 MED ORDER — SYNTHROID 50 MCG PO TABS: ORAL_TABLET | ORAL | 1 refills | Status: DC

## 2018-11-29 NOTE — Patient Instructions (Addendum)
Andrea Bates , Thank you for taking time to come for your Medicare Wellness Visit. I appreciate your ongoing commitment to your health goals. Please review the following plan we discussed and let me know if I can assist you in the future.    This is a list of the screening recommended for you and due dates:  Health Maintenance  Topic Date Due  . Mammogram  02/25/1997  . DEXA scan (bone density measurement)  02/26/2012  . Tetanus Vaccine  11/18/2012  . Colon Cancer Screening  08/18/2023  .  Hepatitis C: One time screening is recommended by Center for Disease Control  (CDC) for  adults born from [birth year] through 1964.   Completed  . Flu Shot  Discontinued  . Pneumonia vaccines  Discontinued   Please schedule an appointment with dentist - suggest reaching out to dental school in [place]  7 a.m.-6:30 p.m., Monday 7 a.m.-5 p.m., Tuesday-Friday Schedule an appointment by calling [phone].       Preventing Osteoporosis, Adult Osteoporosis is a condition that causes the bones to get weaker. With osteoporosis, the bones become thinner, and the normal spaces in bone tissue become larger. This can make the bones weak and cause them to break more easily. People who have osteoporosis are more likely to break their wrist, spine, or hip. Even a minor accident or injury can be enough to break weak bones. Osteoporosis can occur with aging. Your body constantly replaces old bone tissue with new tissue. As you get older, you may lose bone tissue more quickly, or it may be replaced more slowly. Osteoporosis is more likely to develop if you have poor nutrition or do not get enough calcium or vitamin D. Other lifestyle factors can also play a role. By making some diet and lifestyle changes, you can help to keep your bones healthy and help to prevent osteoporosis. What nutrition changes can be made? Nutrition plays an  important role in maintaining healthy, strong bones.  Make sure you get enough calcium every day from food or from calcium supplements. ? If you are age 28 or younger, aim to get 1,000 mg of calcium every day. ? If you are older than age 34, aim to get 1,200 mg of calcium every day.  Try to get enough vitamin D every day. ? If you are age 49 or younger, aim to get 600 international units (IU) every day. ? If you are older than age 10, aim to get 800 international units every day.  Follow a healthy diet. Eat plenty of foods that contain calcium and vitamin D. ? Calcium is in milk, cheese, yogurt, and other dairy products. Some fish and vegetables are also good sources of calcium. Many foods such as cereals and breads have had calcium added to them (are fortified). Check nutrition labels to see how much calcium is in a food or drink. ? Foods that contain vitamin D include milk, cereals, salmon, and tuna. Your body also makes vitamin D when you are out in the sun. Bare skin exposure to the sun on your face, arms, legs, or back for no more than 30 minutes a day, 2 times per week is more than enough. Beyond that, it is important to use sunblock to protect your skin from sunburn, which increases your risk for skin cancer. What lifestyle changes can be made? Making changes in your everyday life can also play  an important role in preventing osteoporosis.  Stay active and get exercise every day. Ask your health care provider what types of exercise are best for you.  Do not use any products that contain nicotine or tobacco, such as cigarettes and e-cigarettes. If you need help quitting, ask your health care provider.  Limit alcohol intake to no more than 1 drink a day for nonpregnant women and 2 drinks a day for men. One drink equals 12 oz of beer, 5 oz of wine, or 1 oz of hard liquor. Why are these changes important? Making these nutrition and lifestyle changes can:  Help you develop and maintain  healthy, strong bones.  Prevent loss of bone mass and the problems that are caused by that loss, such as broken bones and delayed healing.  Make you feel better mentally and physically. What can happen if changes are not made? Problems that can result from osteoporosis can be very serious. These may include:  A higher risk of broken bones that are painful and do not heal well.  Physical malformations, such as a collapsed spine or a hunched back.  Problems with movement. Where to find support If you need help making changes to prevent osteoporosis, talk with your health care provider. You can ask for a referral to a diet and nutrition specialist (dietitian) and a physical therapist. Where to find more information Learn more about osteoporosis from:  NIH Osteoporosis and Related [place]: www.niams.GolfingGoddess.com.br  U.S. Office on Women's Health: SouvenirBaseball.es.html  National Osteoporosis Foundation: ProfilePeek.ch Summary  Osteoporosis is a condition that causes weak bones that are more likely to break.  Eating a healthy diet and making sure you get enough calcium and vitamin D can help prevent osteoporosis.  Other ways to reduce your risk of osteoporosis include getting regular exercise and avoiding alcohol and products that contain nicotine or tobacco. This information is not intended to replace advice given to you by your health care provider. Make sure you discuss any questions you have with your health care provider. Document Released: 03/23/2015 Document Revised: 02/18/2017 Document Reviewed: 11/17/2015 Elsevier Patient Education  2020 [employer].

## 2018-11-30 LAB — CBC WITH DIFFERENTIAL/PLATELET
Absolute Monocytes: 455 cells/uL (ref 200–950)
Basophils Absolute: 63 cells/uL (ref 0–200)
Basophils Relative: 0.9 %
Eosinophils Absolute: 70 cells/uL (ref 15–500)
Eosinophils Relative: 1 %
HCT: 39.2 % (ref 35.0–45.0)
Hemoglobin: 13.4 g/dL (ref 11.7–15.5)
Lymphs Abs: 1820 cells/uL (ref 850–3900)
MCH: 29.9 pg (ref 27.0–33.0)
MCHC: 34.2 g/dL (ref 32.0–36.0)
MCV: 87.5 fL (ref 80.0–100.0)
MPV: 10.3 fL (ref 7.5–12.5)
Monocytes Relative: 6.5 %
Neutro Abs: 4592 cells/uL (ref 1500–7800)
Neutrophils Relative %: 65.6 %
Platelets: 350 10*3/uL (ref 140–400)
RBC: 4.48 10*6/uL (ref 3.80–5.10)
RDW: 13 % (ref 11.0–15.0)
Total Lymphocyte: 26 %
WBC: 7 10*3/uL (ref 3.8–10.8)

## 2018-11-30 LAB — COMPLETE METABOLIC PANEL WITH GFR
AG Ratio: 1.8 (calc) (ref 1.0–2.5)
ALT: 21 U/L (ref 6–29)
AST: 20 U/L (ref 10–35)
Albumin: 4.6 g/dL (ref 3.6–5.1)
Alkaline phosphatase (APISO): 123 U/L (ref 37–153)
BUN: 16 mg/dL (ref 7–25)
CO2: 27 mmol/L (ref 20–32)
Calcium: 9.8 mg/dL (ref 8.6–10.4)
Chloride: 106 mmol/L (ref 98–110)
Creat: 0.74 mg/dL (ref 0.60–0.93)
GFR, Est African American: 94 mL/min/{1.73_m2} (ref >=60)
GFR, Est Non African American: 82 mL/min/{1.73_m2} (ref >=60)
Globulin: 2.5 g/dL (calc) (ref 1.9–3.7)
Glucose, Bld: 89 mg/dL (ref 65–99)
Potassium: 4.2 mmol/L (ref 3.5–5.3)
Sodium: 142 mmol/L (ref 135–146)
Total Bilirubin: 0.4 mg/dL (ref 0.2–1.2)
Total Protein: 7.1 g/dL (ref 6.1–8.1)

## 2018-11-30 LAB — LIPID PANEL
Cholesterol: 285 mg/dL — ABNORMAL HIGH (ref <200)
HDL: 84 mg/dL (ref >=50)
LDL Cholesterol (Calc): 183 mg/dL (calc) — ABNORMAL HIGH
Non-HDL Cholesterol (Calc): 201 mg/dL (calc) — ABNORMAL HIGH (ref <130)
Total CHOL/HDL Ratio: 3.4 (calc) (ref <5.0)
Triglycerides: 78 mg/dL (ref <150)

## 2018-11-30 LAB — MAGNESIUM: Magnesium: 2.2 mg/dL (ref 1.5–2.5)

## 2018-11-30 LAB — TSH: TSH: 1.39 mIU/L (ref 0.40–4.50)

## 2018-12-12 ENCOUNTER — Ambulatory Visit: Payer: PPO | Admitting: Gastroenterology

## 2018-12-22 ENCOUNTER — Ambulatory Visit: Payer: PPO | Admitting: Gastroenterology

## 2019-01-24 ENCOUNTER — Ambulatory Visit: Payer: PPO | Admitting: Gastroenterology

## 2019-01-25 DIAGNOSIS — H25813 Combined forms of age-related cataract, bilateral: Secondary | ICD-10-CM | POA: Diagnosis not present

## 2019-01-25 DIAGNOSIS — H04123 Dry eye syndrome of bilateral lacrimal glands: Secondary | ICD-10-CM | POA: Diagnosis not present

## 2019-01-25 DIAGNOSIS — H524 Presbyopia: Secondary | ICD-10-CM | POA: Diagnosis not present

## 2019-01-25 DIAGNOSIS — H35361 Drusen (degenerative) of macula, right eye: Secondary | ICD-10-CM | POA: Diagnosis not present

## 2019-02-13 ENCOUNTER — Encounter: Payer: Self-pay | Admitting: Gastroenterology

## 2019-02-13 ENCOUNTER — Ambulatory Visit: Payer: PPO | Admitting: Gastroenterology

## 2019-02-13 ENCOUNTER — Other Ambulatory Visit: Payer: Self-pay

## 2019-02-13 VITALS — BP 126/74 | HR 84 | Temp 97.6°F | Ht 62.0 in | Wt 111.1 lb

## 2019-02-13 DIAGNOSIS — A049 Bacterial intestinal infection, unspecified: Secondary | ICD-10-CM

## 2019-02-13 DIAGNOSIS — K582 Mixed irritable bowel syndrome: Secondary | ICD-10-CM | POA: Diagnosis not present

## 2019-02-13 NOTE — Patient Instructions (Signed)
If you are age 72 or older, your body mass index should be between 23-30. Your Body mass index is 20.33 kg/m. If this is out of the aforementioned range listed, please consider follow up with your Primary Care Provider.  If you are age 48 or younger, your body mass index should be between 19-25. Your Body mass index is 20.33 kg/m. If this is out of the aformentioned range listed, please consider follow up with your Primary Care Provider.   Follow up in 6 months   Thank you,  Dr. Jackquline Denmark

## 2019-02-13 NOTE — Progress Notes (Signed)
IMPRESSION and PLAN:   #1. SIBO 09/2018. Treated with rifaxamin #2. IBS with bloating/constipation/LUQ pain. 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 Neg CT 07/2015 except for redundant stool filled colon, neg EGD with SB Bx 08/2018 and colon 07/2018 except for TAs. Neg celiac screen and Nl TSH. #2. H/O tubular adenomas 07/2018. Next due 07/2023. #3. GERD with small HH.   Plan: - Can use Nexium 20mg  po qd on PRN basis. - Continue current medications. - FU 6 months. If with any further problems, would repeat CT abdo/pelvis.      HPI:    Chief Complaint:   Andrea Bates is a 73 y.o. female  For follow-up visit. Underwent EGD and colonoscopy on 08/18/2018  EGD showed small hiatal hernia, gastric fundic gland polyp.  Negative small bowel biopsies for celiac disease and negative gastric biopsies for HP.  Colonoscopy revealed 2 small sessile tubular adenomas, moderate sigmoid diverticulosis.  The colon was highly redundant.  Underwent hydrogen breath test which revealed small bowel bacterial overgrowth.  Treated with rifaximin 550 mg p.o. 3 times daily for 2 weeks with excellent results.  She feels significantly better.  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  She does feel better.  Still with abdominal bloating.  Would like to get hydrogen breath test.  She does not like to take Nexium and has stopped.  No fever or chills  Has history of chronic constipation which is better currently.  No further palpitations.      Past Medical History:  Diagnosis Date  . Chest pain    a. negative myoview 2011 [name]);  b. 04/2010 Cath: normal cors  . Colon polyps   . Diverticulosis   . GERD (gastroesophageal reflux disease)    a. mild esophagitis on egd 2012  . Hemorrhoids   . Hiatal hernia   . Hyperlipidemia   . Hyperlipidemia    Is now taking Fish oil. Had myalgias with Vytorin and Lipitor  .  Hypothyroidism   . Irritable bowel syndrome   . Palpitations    a. 04/2008 echo: EF 50-60%;  b. 21 day event monitor 2012 - occasional sinus tachycardia with rates 110-120's. Rare PAC's/PVC's. Serveral short 4-5 beat runs of a-tach. c. 06/2016: Event monitor showing PAC's, PVC's, and brief episodes of PAT.   Marland Kitchen Personal history of colonic polyps    a. colonoscopy 2009  . Personal history of malignant neoplasm of breast   . Pre-diabetes   . Syncope (VasoVagal Faint) 04/27/2010    Current Outpatient Medications  Medication Sig Dispense Refill  . Cholecalciferol (VITAMIN D) 125 MCG (5000 UT) CAPS Take 1 capsule by mouth daily.    . Coenzyme Q10 (COQ10) 100 MG CAPS Take 100 mg by mouth daily. Reported on 07/18/2015    . Digestive Enzymes (DIGESTIVE ENZYME ULTRA PO) Take 1 tablet by mouth daily.    . finasteride (PROPECIA) 1 MG tablet Take 0.5 mg by mouth daily.    . fish oil-omega-3 fatty acids 1000 MG capsule Take 1 g by mouth daily. Reported on 07/18/2015    . Magnesium 200 MG TABS Take 1 tablet (200 mg total) by mouth 2 (two) times daily. 60 each 5  . mometasone (NASONEX) 50 MCG/ACT nasal spray Place 2 sprays into the nose daily.    Marland Kitchen POTASSIUM CHLORIDE PO Take 99 mg by mouth daily.     . Probiotic Product (PROBIOTIC DAILY PO) Take 1  tablet by mouth daily.    . Simethicone (PHAZYME MAXIMUM STRENGTH) 250 MG CAPS Take 1 capsule by mouth daily as needed.    Marland Kitchen SYNTHROID 50 MCG tablet Take 1 tablet daily on an empty stomach with only water for 30 minutes & no Antacid meds, Calcium or Magnesium for 4 hours & avoid Biotin 90 tablet 1   No current facility-administered medications for this visit.     Past Surgical History:  Procedure Laterality Date  . BUNIONECTOMY    . CARPAL TUNNEL RELEASE     bilateral   . COLONOSCOPY  07/04/2013   Polyp, Polypectomy. Moderate diverticulosis in L colon. BX: Tubular Adenoma no dyspasia or malignancy. 5 yr rpt colon  . NASAL SINUS SURGERY    . TONSILLECTOMY     . UPPER GASTROINTESTINAL ENDOSCOPY  08/09/2016   normal EGD  . UPPER GASTROINTESTINAL ENDOSCOPY  07/04/2013   normal EGD    Family History  Problem Relation Age of Onset  . Colon cancer Other        Paternal Great Unlce   . Diabetes Paternal Grandmother   . Heart defect Father   . Breast cancer Paternal Aunt     Social History   Tobacco Use  . Smoking status: Never Smoker  . Smokeless tobacco: Never Used  Substance Use Topics  . Alcohol use: No  . Drug use: No    Allergies  Allergen Reactions  . Iodine Palpitations  . Armour Thyroid [Thyroid]     Palpitations   . Atenolol     presyncope  . Celexa [Citalopram Hydrobromide]     Severe chest tightness   . Diltiazem Hcl     Dropped bp  . Levaquin [Levofloxacin In D5w] Nausea And Vomiting  . Lipitor [Atorvastatin]     Myalgias   . Propranolol Hcl     Felt bad  . Vytorin [Ezetimibe-Simvastatin]     Myalgias      Review of Systems: All systems reviewed and negative except where noted in HPI.    Physical Exam:     BP 126/74   Pulse 84   Temp 97.6 F (36.4 C)   Ht 5\' 2"  (1.575 m)   Wt 111 lb 2 oz (50.4 kg)   BMI 20.33 kg/m  HEENT: No jaundice, no pallor Respiratory: Bilateral clear. CVS: S1-S2 normal no S3 or S4. Abdo: Soft nontender bowel sounds are present no definite hepatosplenomegaly.   CBC Latest Ref Rng & Units 11/29/2018 05/09/2018 07/15/2016  WBC 3.8 - 10.8 Thousand/uL 7.0 7.2 6.0  Hemoglobin 11.7 - 15.5 g/dL 13.4 12.9 13.5  Hematocrit 35.0 - 45.0 % 39.2 38.3 39.1  Platelets 140 - 400 Thousand/uL 350 356 324   CMP Latest Ref Rng & Units 11/29/2018 05/09/2018 07/15/2016  Glucose 65 - 99 mg/dL 89 86 73  BUN 7 - 25 mg/dL 16 17 11   Creatinine 0.60 - 0.93 mg/dL 0.74 0.74 0.74  Sodium 135 - 146 mmol/L 142 142 140  Potassium 3.5 - 5.3 mmol/L 4.2 4.5 4.3  Chloride 98 - 110 mmol/L 106 104 102  CO2 20 - 32 mmol/L 27 28 25   Calcium 8.6 - 10.4 mg/dL 9.8 10.1 9.9  Total Protein 6.1 - 8.1 g/dL 7.1 7.1  6.9  Total Bilirubin 0.2 - 1.2 mg/dL 0.4 0.3 0.4  Alkaline Phos 33 - 130 U/L - - 102  AST 10 - 35 U/L 20 27 21   ALT 6 - 29 U/L 21 46(H) 16   I spent 15  minutes of face-to-face time with the patient. Greater than 50% of the time was spent counseling and coordinating care.      Fidencia Mccloud,MD 02/13/2019, 1:44 PM   CC Unk Pinto, MD

## 2019-05-12 ENCOUNTER — Telehealth: Payer: Self-pay | Admitting: Nurse Practitioner

## 2019-05-12 ENCOUNTER — Other Ambulatory Visit (HOSPITAL_COMMUNITY): Payer: Self-pay | Admitting: Physician Assistant

## 2019-05-12 DIAGNOSIS — R05 Cough: Secondary | ICD-10-CM | POA: Diagnosis not present

## 2019-05-12 DIAGNOSIS — U071 COVID-19: Secondary | ICD-10-CM

## 2019-05-12 DIAGNOSIS — K5730 Diverticulosis of large intestine without perforation or abscess without bleeding: Secondary | ICD-10-CM

## 2019-05-12 DIAGNOSIS — K588 Other irritable bowel syndrome: Secondary | ICD-10-CM

## 2019-05-12 DIAGNOSIS — Z20828 Contact with and (suspected) exposure to other viral communicable diseases: Secondary | ICD-10-CM | POA: Diagnosis not present

## 2019-05-12 DIAGNOSIS — R0981 Nasal congestion: Secondary | ICD-10-CM | POA: Diagnosis not present

## 2019-05-12 NOTE — Progress Notes (Signed)
  I connected by phone with Andrea Bates on 05/12/2019 at 12:30 PM to discuss the potential use of an new treatment for mild to moderate COVID-19 viral infection in non-hospitalized patients.  This patient is a 74 y.o. female that meets the FDA criteria for Emergency Use Authorization of bamlanivimab or casirivimab\imdevimab.  Has a (+) direct SARS-CoV-2 viral test result  Has mild or moderate COVID-19   Is ? 73 years of age and weighs ? 40 kg  Is NOT hospitalized due to COVID-19  Is NOT requiring oxygen therapy or requiring an increase in baseline oxygen flow rate due to COVID-19  Is within 10 days of symptom onset  Has at least one of the high risk factor(s) for progression to severe COVID-19 and/or hospitalization as defined in EUA.  Specific high risk criteria : >/= 73 yo  I have spoken and communicated the following to the patient or parent/caregiver:  1. FDA has authorized the emergency use of bamlanivimab and casirivimab\imdevimab for the treatment of mild to moderate COVID-19 in adults and pediatric patients with positive results of direct SARS-CoV-2 viral testing who are 64 years of age and older weighing at least 40 kg, and who are at high risk for progressing to severe COVID-19 and/or hospitalization.  2. The significant known and potential risks and benefits of bamlanivimab and casirivimab\imdevimab, and the extent to which such potential risks and benefits are unknown.  3. Information on available alternative treatments and the risks and benefits of those alternatives, including clinical trials.  4. Patients treated with bamlanivimab and casirivimab\imdevimab should continue to self-isolate and use infection control measures (e.g., wear mask, isolate, social distance, avoid sharing personal items, clean and disinfect "high touch" surfaces, and frequent handwashing) according to CDC guidelines.   5. The patient or parent/caregiver has the option to accept or refuse  bamlanivimab or casirivimab\imdevimab .  After reviewing this information with the patient, The patient agreed to proceed with receiving the bamlanimivab infusion and will be provided a copy of the Fact sheet prior to receiving the infusion.   [name] 05/12/2019 12:30 PM

## 2019-05-12 NOTE — Telephone Encounter (Signed)
Referral received via hotline from Dr. Melford Aase. Patient has a positive result obtained from [facility] testing site.   Called to discuss with Andrea Bates about Covid symptoms and the use of bamlanivimab, a monoclonal antibody infusion for those with mild to moderate Covid symptoms and at a high risk of hospitalization.     Pt is qualified for this infusion at the [facility] infusion center due to co-morbid conditions and/or a member of an at-risk group.   Unable to reach patient. Voicemail left and MyChart message also sent.    Patient Active Problem List   Diagnosis Date Noted  . Gastroesophageal reflux disease without esophagitis 07/23/2015  . Vitamin D deficiency 06/06/2013  . Medication management 06/06/2013  . Dysphagia 07/05/2010  . Constipation 07/05/2010  . VERTIGO 06/08/2010  . Hypothyroidism 03/31/2009  . Diverticulosis of large intestine 03/31/2009  . PERSONAL HX BREAST CANCER 03/31/2009  . History of colonic polyps 03/31/2009  . Hyperlipidemia 05/07/2008    Andrea Bates, AGPCNP-BC Pager: [phone] Amion: Bjorn Pippin

## 2019-05-13 ENCOUNTER — Ambulatory Visit (HOSPITAL_COMMUNITY): Payer: PPO

## 2019-05-22 ENCOUNTER — Other Ambulatory Visit: Payer: Self-pay | Admitting: Adult Health

## 2019-05-22 DIAGNOSIS — E039 Hypothyroidism, unspecified: Secondary | ICD-10-CM

## 2019-05-31 ENCOUNTER — Encounter: Payer: PPO | Admitting: Internal Medicine

## 2019-06-08 DIAGNOSIS — J449 Chronic obstructive pulmonary disease, unspecified: Secondary | ICD-10-CM | POA: Diagnosis not present

## 2019-06-08 DIAGNOSIS — J9801 Acute bronchospasm: Secondary | ICD-10-CM | POA: Diagnosis not present

## 2019-07-02 ENCOUNTER — Telehealth: Payer: Self-pay | Admitting: Gastroenterology

## 2019-07-02 DIAGNOSIS — A049 Bacterial intestinal infection, unspecified: Secondary | ICD-10-CM

## 2019-07-02 NOTE — Telephone Encounter (Signed)
Pt states that she has been experiencing the same sxs that she had when she had bacterial infection, she states that Dr. Lyndel Safe told her that if sxs reoccur to call to prescribe more antibiotics. Pls call pt.

## 2019-07-02 NOTE — Telephone Encounter (Signed)
Previously, rifaximin worked really well. Lets give another course- rifaximin 550 mg p.o. 3 times daily for 2 weeks. If you can, please arrange for samples or any other assistance.  RG

## 2019-07-02 NOTE — Telephone Encounter (Signed)
Patient returned call to the office-patient reports she "is having bloating that does not go away" "cramping in my abdomen above my belly button-stomach pressure below my belly button-I think this is another flare up of the infection";  Can she be prescribed any OTC medications before the antibiotics or can she just have a RX for the antibiotics?  Patient is requesting to know if the antibiotics that were given to her in Feb due to [place] made these symptoms worse?  Patient is wanting to know if she can have something be called in instead of Xifaxan because of cost?   Please advise

## 2019-07-02 NOTE — Telephone Encounter (Signed)
Left message for patient to call back to the office;  

## 2019-07-03 MED ORDER — RIFAXIMIN 550 MG PO TABS: 550.0000 mg | ORAL_TABLET | Freq: Three times a day (TID) | ORAL | 0 refills | Status: AC

## 2019-07-03 NOTE — Telephone Encounter (Signed)
Called and spoke with patient-patient informed of MD recommendations; patient is agreeable with plan of care and RX sent to [facility] for processing; Patient verbalized understanding of information/instructions;  Patient was advised to call the office at [phone] if questions/concerns arise;

## 2019-07-09 ENCOUNTER — Telehealth: Payer: Self-pay | Admitting: Gastroenterology

## 2019-07-09 NOTE — Telephone Encounter (Signed)
Lets try metronidazole 250mg  po TID x 10 days This is least expensive  RG

## 2019-07-09 NOTE — Telephone Encounter (Signed)
I have called and spoke to patient and let her know that we do not have any samples at this time. Patient wants to know if there is anything else she can do?

## 2019-07-09 NOTE — Telephone Encounter (Signed)
Pt reported that medication for SIBO is over $600.

## 2019-07-10 MED ORDER — METRONIDAZOLE 250 MG PO TABS: 250.0000 mg | ORAL_TABLET | Freq: Three times a day (TID) | ORAL | 0 refills | Status: AC

## 2019-07-10 NOTE — Telephone Encounter (Signed)
I have sent prescription to patients pharmacy, I have also left message for patient to return my call.

## 2019-07-10 NOTE — Telephone Encounter (Signed)
Patient informed, voiced understanding.  °

## 2019-09-18 ENCOUNTER — Encounter: Payer: Self-pay | Admitting: Gastroenterology

## 2019-09-18 ENCOUNTER — Ambulatory Visit: Payer: PPO | Admitting: Gastroenterology

## 2019-09-18 VITALS — BP 130/78 | HR 89 | Temp 96.6°F | Ht 62.0 in | Wt 107.4 lb

## 2019-09-18 DIAGNOSIS — K581 Irritable bowel syndrome with constipation: Secondary | ICD-10-CM | POA: Diagnosis not present

## 2019-09-18 DIAGNOSIS — R1013 Epigastric pain: Secondary | ICD-10-CM

## 2019-09-18 DIAGNOSIS — K219 Gastro-esophageal reflux disease without esophagitis: Secondary | ICD-10-CM

## 2019-09-18 DIAGNOSIS — R140 Abdominal distension (gaseous): Secondary | ICD-10-CM

## 2019-09-18 DIAGNOSIS — K449 Diaphragmatic hernia without obstruction or gangrene: Secondary | ICD-10-CM

## 2019-09-18 MED ORDER — ESOMEPRAZOLE MAGNESIUM 20 MG PO CPDR
20.0000 mg | DELAYED_RELEASE_CAPSULE | Freq: Every day | ORAL | 0 refills | Status: DC
Start: 2019-09-18 — End: 2020-04-07

## 2019-09-18 NOTE — Patient Instructions (Addendum)
If you are age 73 or older, your body mass index should be between 23-30. Your Body mass index is 19.64 kg/m. If this is out of the aforementioned range listed, please consider follow up with your Primary Care Provider.  If you are age 39 or younger, your body mass index should be between 19-25. Your Body mass index is 19.64 kg/m. If this is out of the aformentioned range listed, please consider follow up with your Primary Care Provider.   We have sent the following medications to your pharmacy for you to pick up at your convenience: Nexium 20 mg Daily for two weeks.  Continue current medications.  We will contact you regarding ultrasound.  Follow up with me in three months.  Please call for an appointment as the schedule is not available at this time.  Thank you for choosing me and [facility].   Jackquline Denmark, MD

## 2019-09-18 NOTE — Progress Notes (Signed)
IMPRESSION and PLAN:    #1. Epi pain #2. SIBO 09/2018. Treated with rifaxamin #3. IBS with bloating/constipation/LUQ pain. 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 Neg CT 07/2015 except for redundant stool filled colon, neg EGD with SB Bx 08/2018 and colon 07/2018 except for TAs. Neg celiac screen and Nl TSH. #4. H/O tubular adenomas 07/2018. Next due 07/2023. #5. GERD with small HH.   Plan: - [place] with mesenteric doppler - Nexium 20mg  po qd x 2 weeks. - Continue current medications. - FU 12 weeks. If with any further problems, would repeat CT abdo/pelvis.      HPI:    Chief Complaint:   Andrea Bates is a 74 y.o. female  For follow-up visit. Had covid Feb 2021 with pneumonia - treated with antibiotics Ever since she has increasing epigastric pain with palpitations Very much concerned about circulation  Denies having any significant heartburn, odynophagia or dysphagia.  She does have chronic left upper quadrant abdominal pain with some bloating.  She also has longstanding history of constipation.  This is somewhat better.      From previous note: underwent EGD and colonoscopy on 08/18/2018  EGD showed small HH, gastric fundic gland polyp.  Neg SB Bx for celiac, neg gastric Bx for HP.  Colonoscopy revealed 2 small sessile TAs, mod sig div.  The colon was highly redundant.  Underwent H2-breath test showing SIBO.  Treated with rifaximin 550 mg TID for 2 weeks with excellent results.  Then again started having more abdominal bloating requiring another course of Flagyl (rifaximin was too expensive)      Past Medical History:  Diagnosis Date  . Chest pain    a. negative myoview 2011 [name]);  b. 04/2010 Cath: normal cors  . Colon polyps   . Diverticulosis   . GERD (gastroesophageal reflux disease)    a. mild esophagitis on egd 2012  . Hemorrhoids   . Hiatal hernia   . Hyperlipidemia   . Hyperlipidemia    Is now taking Fish oil. Had myalgias with Vytorin and Lipitor  .  Hypothyroidism   . Irritable bowel syndrome   . Palpitations    a. 04/2008 echo: EF 50-60%;  b. 21 day event monitor 2012 - occasional sinus tachycardia with rates 110-120's. Rare PAC's/PVC's. Serveral short 4-5 beat runs of a-tach. c. 06/2016: Event monitor showing PAC's, PVC's, and brief episodes of PAT.   Marland Kitchen Personal history of colonic polyps    a. colonoscopy 2009  . Personal history of malignant neoplasm of breast   . Pre-diabetes   . Syncope (VasoVagal Faint) 04/27/2010    Current Outpatient Medications  Medication Sig Dispense Refill  . ALPHA LIPOIC ACID PO Take 1 tablet by mouth 2 (two) times daily.    . Cholecalciferol (VITAMIN D) 125 MCG (5000 UT) CAPS Take 1 capsule by mouth daily.    . Coenzyme Q10 (COQ10) 100 MG CAPS Take 100 mg by mouth daily. Reported on 07/18/2015    . Digestive Enzymes (DIGESTIVE ENZYME ULTRA PO) Take 1 tablet by mouth daily.    . finasteride (PROPECIA) 1 MG tablet Take 0.5 mg by mouth daily.    . fish oil-omega-3 fatty acids 1000 MG capsule Take 1 g by mouth daily. Reported on 07/18/2015    . Magnesium 300 MG CAPS Take 1 capsule by mouth 2 (two) times daily.    . mometasone (NASONEX) 50 MCG/ACT nasal spray Place 2 sprays into the nose daily.    Marland Kitchen POTASSIUM CHLORIDE PO  Take 90 mcg by mouth daily.     . Probiotic Product (PROBIOTIC DAILY PO) Take 1 tablet by mouth daily.    . Simethicone (PHAZYME MAXIMUM STRENGTH) 250 MG CAPS Take 1 capsule by mouth daily as needed.    Marland Kitchen SYNTHROID 50 MCG tablet TAKE 1 TABLET DAILY ON AN EMPTY STOMACH WITH ONLY WATER FOR 30 MINUTES & NO ANTACID MEDS, CALCIUM OR MAGNESIUM FOR 4 HOURS & AVOID BIOTIN 90 tablet 1   No current facility-administered medications for this visit.    Past Surgical History:  Procedure Laterality Date  . BUNIONECTOMY    . CARPAL TUNNEL RELEASE     bilateral   . COLONOSCOPY  07/04/2013   Polyp, Polypectomy. Moderate diverticulosis in L colon. BX: Tubular Adenoma no dyspasia or malignancy. 5 yr rpt  colon  . NASAL SINUS SURGERY    . TONSILLECTOMY    . UPPER GASTROINTESTINAL ENDOSCOPY  08/09/2016   normal EGD  . UPPER GASTROINTESTINAL ENDOSCOPY  07/04/2013   normal EGD    Family History  Problem Relation Age of Onset  . Colon cancer Other        Paternal Great Unlce   . Diabetes Paternal Grandmother   . Heart defect Father   . Breast cancer Paternal Aunt     Social History   Tobacco Use  . Smoking status: Never Smoker  . Smokeless tobacco: Never Used  Vaping Use  . Vaping Use: Never used  Substance Use Topics  . Alcohol use: No  . Drug use: No    Allergies  Allergen Reactions  . Iodine Palpitations  . Armour Thyroid [Thyroid]     Palpitations   . Atenolol     presyncope  . Celexa [Citalopram Hydrobromide]     Severe chest tightness   . Diltiazem Hcl     Dropped bp  . Levaquin [Levofloxacin In D5w] Nausea And Vomiting  . Lipitor [Atorvastatin]     Myalgias   . Propranolol Hcl     Felt bad  . Vytorin [Ezetimibe-Simvastatin]     Myalgias      Review of Systems: All systems reviewed and negative except where noted in HPI.    Physical Exam:     BP 130/78   Pulse 89   Temp (!) 96.6 F (35.9 C)   Ht 5\' 2"  (1.575 m)   Wt 107 lb 6 oz (48.7 kg)   BMI 19.64 kg/m  HEENT: No jaundice, no pallor Respiratory: Bilateral clear. CVS: S1-S2 normal no S3 or S4. Abdo: Soft nontender bowel sounds are present no definite hepatosplenomegaly.   CBC Latest Ref Rng & Units 11/29/2018 05/09/2018 07/15/2016  WBC 3.8 - 10.8 Thousand/uL 7.0 7.2 6.0  Hemoglobin 11.7 - 15.5 g/dL 13.4 12.9 13.5  Hematocrit 35 - 45 % 39.2 38.3 39.1  Platelets 140 - 400 Thousand/uL 350 356 324   CMP Latest Ref Rng & Units 11/29/2018 05/09/2018 07/15/2016  Glucose 65 - 99 mg/dL 89 86 73  BUN 7 - 25 mg/dL 16 17 11   Creatinine 0.60 - 0.93 mg/dL 0.74 0.74 0.74  Sodium 135 - 146 mmol/L 142 142 140  Potassium 3.5 - 5.3 mmol/L 4.2 4.5 4.3  Chloride 98 - 110 mmol/L 106 104 102  CO2 20 - 32  mmol/L 27 28 25   Calcium 8.6 - 10.4 mg/dL 9.8 10.1 9.9  Total Protein 6.1 - 8.1 g/dL 7.1 7.1 6.9  Total Bilirubin 0.2 - 1.2 mg/dL 0.4 0.3 0.4  Alkaline Phos 33 - 130  U/L - - 102  AST 10 - 35 U/L 20 27 21   ALT 6 - 29 U/L 21 46(H) 16        Leonell Lobdell,MD 09/18/2019, 2:26 PM   CC Unk Pinto, MD

## 2019-09-21 ENCOUNTER — Other Ambulatory Visit: Payer: Self-pay

## 2019-09-21 DIAGNOSIS — R1013 Epigastric pain: Secondary | ICD-10-CM

## 2019-09-21 DIAGNOSIS — G8929 Other chronic pain: Secondary | ICD-10-CM

## 2019-09-21 NOTE — Progress Notes (Signed)
mse

## 2019-10-15 DIAGNOSIS — M545 Low back pain: Secondary | ICD-10-CM | POA: Diagnosis not present

## 2019-10-19 ENCOUNTER — Ambulatory Visit (HOSPITAL_COMMUNITY)
Admission: RE | Admit: 2019-10-19 | Discharge: 2019-10-19 | Disposition: A | Discharge status: Home / Self Care | Payer: PPO | Source: Ambulatory Visit | Attending: Cardiovascular Disease | Admitting: Cardiovascular Disease

## 2019-10-19 ENCOUNTER — Other Ambulatory Visit: Payer: Self-pay

## 2019-10-19 DIAGNOSIS — R1013 Epigastric pain: Secondary | ICD-10-CM | POA: Insufficient documentation

## 2019-10-19 DIAGNOSIS — G8929 Other chronic pain: Secondary | ICD-10-CM | POA: Diagnosis not present

## 2019-11-05 ENCOUNTER — Encounter: Payer: PPO | Admitting: Physician Assistant

## 2019-11-28 ENCOUNTER — Other Ambulatory Visit: Payer: Self-pay | Admitting: Internal Medicine

## 2019-11-28 DIAGNOSIS — E039 Hypothyroidism, unspecified: Secondary | ICD-10-CM

## 2019-12-11 ENCOUNTER — Ambulatory Visit: Payer: PPO | Admitting: Adult Health

## 2019-12-12 DIAGNOSIS — Z681 Body mass index (BMI) 19 or less, adult: Secondary | ICD-10-CM | POA: Insufficient documentation

## 2019-12-12 DIAGNOSIS — I700 Atherosclerosis of aorta: Secondary | ICD-10-CM | POA: Insufficient documentation

## 2019-12-12 DIAGNOSIS — G720 Drug-induced myopathy: Secondary | ICD-10-CM | POA: Insufficient documentation

## 2019-12-12 NOTE — Progress Notes (Deleted)
MEDICARE ANNUAL WELLNESS VISIT AND FOLLOW UP  Assessment:   Encounter for Medicare annual wellness exam  Atherosclerosis of aorta Control blood pressure, cholesterol, glucose, increase exercise.    Hyperlipidemia/statin myopathy Intolerant to statins, declines further trials/treatment  Had low risk cardiac workup Continue low cholesterol diet and exercise.  Check lipid panel.    PERSONAL HX BREAST CANCER OVER DUE FOR MGM, will get one - order entered, patient will schedule   History of colonic polyps Up to date  Prediabetes Better with diet.   Medication management Continue medication   Hypothyroidism, unspecified hypothyroidism type will keep range closer to 4 due to anxiety/palpitations.   Constipation, unspecified constipation type Followed closely by GI- Increase fiber/ water intake, decrease caffeine, increase activity level, can add bulk (psyllium) until BM soft. Laboratory tests per orders. Please go to the hospital if you have severe abdominal pain, vomiting, fever, CP, SOB.    Diverticulosis of large intestine without hemorrhage Normal colonoscopy, increase fiber  Health Maintenance Patient declines, understands the not getting vaccines may increase her risk of severe disease and complications including death  Osteopenia Declines dexa; would not take bisphosphonates Continue vitamin D, high calcium diet Encouraged high impact/resistance exercises  VERTIGO Normal neuro Improved with allergy treatment  Medication management -     CBC with Differential/Platelet -     CMP/GFR WITH GFR  Elevated liver function Recent normal imaging, CT/US in 2017, neg hep C, Mild, recent imaging abd 2018 unremarkable  Future Appointments  Date Time Provider [place]  12/13/2019 11:15 AM Liane Comber, NP [facility] None  06/10/2020 11:00 AM Unk Pinto, MD [facility] None  12/08/2020 10:30 AM Liane Comber, NP [facility] None     Plan:   During the  course of the visit the patient was educated and counseled about appropriate screening and preventive services including:    Pneumococcal vaccine   Influenza vaccine  Td vaccine  Screening electrocardiogram  Screening mammography  Bone densitometry screening  Colorectal cancer screening  Diabetes screening  Glaucoma screening  Nutrition counseling   Advanced directives: given info/requested   Subjective:   Andrea Bates is a 74 y.o. female who presents for Medicare Annual Wellness Visit and 3 month follow up on hypertension, hyperlipidemia, vitamin D def.   Her blood pressure has been controlled at home, today their BP is   She does workout. She denies chest pain, shortness of breath, dizziness.    She has had extensive work ups from multiple doctors, has had Normal cath 04/2010, normal stress echo, normal holter, normal CT AB/pelvis/chest, normal colonoscopy (excepting polyps, following q5y/EGD, normal labs. She has high anxiety about her heart and cancer, declines ***   She follows with Dr. Lyndel Safe with [facility] for ongoing GI problems, reports hx of IBS, recent colonoscopy 07/2018 showed benign polyps and mild diverticula. She underwent SIBOW and course of xifaxin. She does report symptoms overall are significantly improved other than ongoing chronic constipation and bloating.   BMI is There is no height or weight on file to calculate BMI., she {HAS HAS ZYS:06301} been working on diet and exercise. Wt Readings from Last 3 Encounters:  09/18/19 107 lb 6 oz (48.7 kg)  02/13/19 111 lb 2 oz (50.4 kg)  11/29/18 110 lb (49.9 kg)   She is not on cholesterol medication (hx of intolerance with numerous cholesterol medications, declines further trial) and denies myalgias. Her cholesterol is not at goal. The cholesterol last visit was:   Lab Results  Component Value Date  CHOL 285 (H) 11/29/2018   HDL 84 11/29/2018   LDLCALC 183 (H) 11/29/2018   LDLDIRECT 180.1 02/01/2013    TRIG 78 11/29/2018   CHOLHDL 3.4 11/29/2018   She has been working on diet and exercise for glucose management, and denies paresthesia of the feet, polydipsia, polyuria and visual disturbances. Last A1C in the office was:  Lab Results  Component Value Date   HGBA1C 5.4 05/09/2018     Lab Results  Component Value Date   GFRNONAA 82 11/29/2018   Patient is on Vitamin D supplement.   Lab Results  Component Value Date   VD25OH 64 05/09/2018     She is on thyroid medication. Her medication was changed last visit, she is now on 34mcg 1/2 a pill a day, and did not take it for 3-4 days.   Lab Results  Component Value Date   TSH 1.39 11/29/2018  .   Medication Review Current Outpatient Medications on File Prior to Visit  Medication Sig Dispense Refill  . ALPHA LIPOIC ACID PO Take 1 tablet by mouth 2 (two) times daily.    . Cholecalciferol (VITAMIN D) 125 MCG (5000 UT) CAPS Take 1 capsule by mouth daily.    . Coenzyme Q10 (COQ10) 100 MG CAPS Take 100 mg by mouth daily. Reported on 07/18/2015    . Digestive Enzymes (DIGESTIVE ENZYME ULTRA PO) Take 1 tablet by mouth daily.    Marland Kitchen esomeprazole (NEXIUM) 20 MG capsule Take 1 capsule (20 mg total) by mouth daily at 12 noon. Take for two weeks. 30 capsule 0  . finasteride (PROPECIA) 1 MG tablet Take 0.5 mg by mouth daily.    . fish oil-omega-3 fatty acids 1000 MG capsule Take 1 g by mouth daily. Reported on 07/18/2015    . Magnesium 300 MG CAPS Take 1 capsule by mouth 2 (two) times daily.    . mometasone (NASONEX) 50 MCG/ACT nasal spray Place 2 sprays into the nose daily.    Marland Kitchen POTASSIUM CHLORIDE PO Take 90 mcg by mouth daily.     . Probiotic Product (PROBIOTIC DAILY PO) Take 1 tablet by mouth daily.    . Simethicone (PHAZYME MAXIMUM STRENGTH) 250 MG CAPS Take 1 capsule by mouth daily as needed.    Marland Kitchen SYNTHROID 50 MCG tablet TAKE 1 TABLET DAILY ON AN EMPTY STOMACH WITH ONLY WATER FOR 30 MINUTES & NO ANTACID MEDS, CALCIUM OR MAGNESIUM FOR 4 HOURS &  AVOID BIOTIN 90 tablet 1   No current facility-administered medications on file prior to visit.    Current Problems (verified) Patient Active Problem List   Diagnosis Date Noted  . Gastroesophageal reflux disease without esophagitis 07/23/2015  . Vitamin D deficiency 06/06/2013  . Medication management 06/06/2013  . Dysphagia 07/05/2010  . Constipation 07/05/2010  . VERTIGO 06/08/2010  . Hypothyroidism 03/31/2009  . Diverticulosis of large intestine 03/31/2009  . PERSONAL HX BREAST CANCER 03/31/2009  . History of colonic polyps 03/31/2009  . Hyperlipidemia 05/07/2008    Screening Tests Immunization History  Administered Date(s) Administered  . DT (Pediatric) 12/11/2014  . PPD Test 12/11/2014  . Td 11/19/2002   Preventative care: Last colonoscopy: 2015, 07/2018, q5y EGD 07/2018 Last mammogram: OVER DUE, last by GYN and remote, poor follow through [place] ***  Last pap smear/pelvic exam: *** 2 years  DEXA: declines, would not take bisphosphonates  Echo  08/2018 LEF 60-65% AB [place] 2008 CT AB/PELVIS- 07/2015 -  CT chest 2012 Gastric emptying 2012  Prior vaccinations:  Patient declines, understands the risks of not getting the vaccine ***  Names of Other Physician/Practitioners you currently use: 1. [facility]- here for primary care 2. [facility], eye doctor, last visit 2019 3. Dr. Gae Bon, dentist, last visit ? 2018  Patient Care Team: Unk Pinto, MD as PCP - General (Internal Medicine) Stanford Breed Denice Bors, MD as PCP - Cardiology (Cardiology) Larey Dresser, MD as Consulting Physician (Cardiology) Jackquline Denmark, MD as Consulting Physician (Gastroenterology)  Allergies Allergies  Allergen Reactions  . Iodine Palpitations  . Armour Thyroid [Thyroid]     Palpitations   . Atenolol     presyncope  . Celexa [Citalopram Hydrobromide]     Severe chest tightness   . Diltiazem Hcl     Dropped bp   . Levaquin [Levofloxacin In D5w] Nausea And Vomiting  . Lipitor [Atorvastatin]     Myalgias   . Propranolol Hcl     Felt bad  . Vytorin [Ezetimibe-Simvastatin]     Myalgias     SURGICAL HISTORY She  has a past surgical history that includes Nasal sinus surgery; Tonsillectomy; Carpal tunnel release; Colonoscopy (07/04/2013); Bunionectomy; Upper gastrointestinal endoscopy (08/09/2016); and Upper gastrointestinal endoscopy (07/04/2013). FAMILY HISTORY Her family history includes Breast cancer in her paternal aunt; Colon cancer in an other family member; Diabetes in her paternal grandmother; Heart defect in her father. SOCIAL HISTORY She  reports that she has never smoked. She has never used smokeless tobacco. She reports that she does not drink alcohol and does not use drugs. MEDICARE WELLNESS OBJECTIVES: Physical activity:   Cardiac risk factors:   Depression/mood screen:   Depression screen [facility] 2/9 11/29/2018  Decreased Interest 0  Down, Depressed, Hopeless 0  PHQ - 2 Score 0    ADLs:  No flowsheet data found.   Cognitive Testing  Alert? Yes  Normal Appearance?Yes  Oriented to person? Yes  Place? Yes   Time? Yes  Recall of three objects?  Yes  Can perform simple calculations? Yes  Displays appropriate judgment?Yes  Can read the correct time from a watch face?Yes  EOL planning:      Objective:   There were no vitals taken for this visit. There is no height or weight on file to calculate BMI.  General appearance: alert, no distress, WD/WN,  female HEENT: normocephalic, sclerae anicteric, TMs pearly, nares patent, no discharge or erythema, pharynx normal Oral cavity: MMM, no lesions Neck: supple, no lymphadenopathy, no thyromegaly, no masses Heart: RRR, normal S1, S2, no murmurs Lungs: CTA bilaterally, no wheezes, rhonchi, or rales Abdomen: +bs, soft, + mild epigatric tenderness no rebound, non distended, no masses, no hepatomegaly, no splenomegaly Musculoskeletal:  nontender, no swelling, no obvious deformity Extremities: no edema, no cyanosis, no clubbing Pulses: 2+ symmetric, upper and lower extremities, normal cap refill Neurological: alert, oriented x 3, CN2-12 intact, strength normal upper extremities and lower extremities, sensation normal throughout, DTRs 2+ throughout, no cerebellar signs, gait normal Psychiatric: normal affect, tearful when talking about anxiety. *** Breast: defer Gyn: defer Rectal: defer   Medicare Attestation I have personally reviewed: The patient's medical and social history Their use of alcohol, tobacco or illicit drugs Their current medications and supplements The patient's functional ability including ADLs,fall risks, home safety risks, cognitive, and hearing and visual impairment Diet and physical activities Evidence for depression or mood disorders  The patient's weight, height, BMI, and visual acuity have been recorded in the chart.  I have made referrals, counseling, and  provided education to the patient based on review of the above and I have provided the patient with a written personalized care plan for preventive services.     Izora Ribas, NP   12/12/2019

## 2019-12-13 ENCOUNTER — Ambulatory Visit: Payer: PPO | Admitting: Adult Health

## 2020-01-09 DIAGNOSIS — M25561 Pain in right knee: Secondary | ICD-10-CM | POA: Diagnosis not present

## 2020-01-09 DIAGNOSIS — M25551 Pain in right hip: Secondary | ICD-10-CM | POA: Diagnosis not present

## 2020-03-24 ENCOUNTER — Encounter: Payer: Self-pay | Admitting: Gastroenterology

## 2020-03-24 ENCOUNTER — Other Ambulatory Visit: Payer: Self-pay

## 2020-03-24 ENCOUNTER — Ambulatory Visit: Payer: PPO | Admitting: Gastroenterology

## 2020-03-24 ENCOUNTER — Other Ambulatory Visit: Payer: Self-pay | Admitting: General Surgery

## 2020-03-24 ENCOUNTER — Other Ambulatory Visit (INDEPENDENT_AMBULATORY_CARE_PROVIDER_SITE_OTHER): Payer: PPO

## 2020-03-24 VITALS — BP 132/80 | HR 76 | Ht 62.0 in | Wt 110.4 lb

## 2020-03-24 DIAGNOSIS — K6389 Other specified diseases of intestine: Secondary | ICD-10-CM | POA: Diagnosis not present

## 2020-03-24 DIAGNOSIS — R1013 Epigastric pain: Secondary | ICD-10-CM | POA: Diagnosis not present

## 2020-03-24 LAB — COMPREHENSIVE METABOLIC PANEL
ALT: 35 U/L (ref 0–35)
AST: 24 U/L (ref 0–37)
Albumin: 4.8 g/dL (ref 3.5–5.2)
Alkaline Phosphatase: 127 U/L — ABNORMAL HIGH (ref 39–117)
BUN: 19 mg/dL (ref 6–23)
CO2: 28 mEq/L (ref 19–32)
Calcium: 9.9 mg/dL (ref 8.4–10.5)
Chloride: 102 mEq/L (ref 96–112)
Creatinine, Ser: 0.75 mg/dL (ref 0.40–1.20)
GFR: 79.17 mL/min (ref >60.00)
Glucose, Bld: 88 mg/dL (ref 70–99)
Potassium: 4.7 mEq/L (ref 3.5–5.1)
Sodium: 138 mEq/L (ref 135–145)
Total Bilirubin: 0.3 mg/dL (ref 0.2–1.2)
Total Protein: 7.1 g/dL (ref 6.0–8.3)

## 2020-03-24 LAB — CBC
HCT: 40.2 % (ref 36.0–46.0)
Hemoglobin: 13.6 g/dL (ref 12.0–15.0)
MCHC: 33.8 g/dL (ref 30.0–36.0)
MCV: 87.9 fl (ref 78.0–100.0)
Platelets: 359 10*3/uL (ref 150.0–400.0)
RBC: 4.57 Mil/uL (ref 3.87–5.11)
RDW: 14.2 % (ref 11.5–15.5)
WBC: 7.7 10*3/uL (ref 4.0–10.5)

## 2020-03-24 LAB — C-REACTIVE PROTEIN: CRP: <1 mg/dL (ref 0.5–20.0)

## 2020-03-24 LAB — LIPASE: Lipase: 41 U/L (ref 11.0–59.0)

## 2020-03-24 MED ORDER — PREDNISONE 50 MG PO TABS: ORAL_TABLET | ORAL | 0 refills | Status: DC

## 2020-03-24 NOTE — Progress Notes (Signed)
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 Dr Chales Abrahams would like to have the patient premeded. Premed sent to pharmacy( 3) 50 mg  Prednisone take as directed  and take 1 25 mg of Benadryl

## 2020-03-24 NOTE — Progress Notes (Signed)
IMPRESSION and PLAN:    #1. Epi pain. Neg Mesenteric doppler 09/2019 #2. SIBO 09/2018. Treated with rifaxamin #3. IBS-C with bloating/LUQ pain. 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 Neg CT 07/2015 except for redundant stool filled colon, neg EGD with SB Bx 08/2018 and colon 07/2018 except for TAs. Neg celiac screen and Nl TSH. #4. H/O tubular adenomas 07/2018. Next due 07/2023. #5. GERD with small HH.   Plan: - CTA after contrast desensitization (she is allergic to shellfish and iodine-does not seem to have true allergy, still will desensitize) - CBC, CMP, lipase, CRP. - Stool testing for fecal elastase to R/O EPI. - Continue Gas-X qhs. - Hold off on rifaxamin. Gave her some samples.      HPI:    Chief Complaint:   Andrea Bates is a 75 y.o. female  For follow-up visit.  Bloating. Didn't tolerate flagyl. Then took another course of rifaxamin x 7 days. Had covid Feb 2021 with pneumonia - treated with antibiotics  C/O postprandial epi pain with "some times like a fist". Occ LUQ pain with gargling.  Very much concerned about circulation. Neg [place] mesenteric 09/2019   Denies having any significant heartburn, odynophagia or dysphagia.  She does have chronic left upper quadrant abdominal pain with some bloating.  She also has longstanding history of constipation.  This is somewhat better.      From previous note: underwent EGD and colonoscopy on 08/18/2018  EGD showed small HH, gastric fundic gland polyp.  Neg SB Bx for celiac, neg gastric Bx for HP.  Colonoscopy revealed 2 small sessile TAs, mod sig div.  The colon was highly redundant.  Underwent H2-breath test showing SIBO.  Treated with rifaximin 550 mg TID for 2 weeks with excellent results.  Then again started having more abdominal bloating requiring another course of Flagyl (rifaximin was too expensive)      Past Medical History:  Diagnosis Date  . Chest pain    a. negative myoview 2011 [name]);  b. 04/2010 Cath:  normal cors  . Colon polyps   . Diverticulosis   . GERD (gastroesophageal reflux disease)    a. mild esophagitis on egd 2012  . Hemorrhoids   . Hiatal hernia   . Hyperlipidemia   . Hyperlipidemia    Is now taking Fish oil. Had myalgias with Vytorin and Lipitor  . Hypothyroidism   . Irritable bowel syndrome   . Palpitations    a. 04/2008 echo: EF 50-60%;  b. 21 day event monitor 2012 - occasional sinus tachycardia with rates 110-120's. Rare PAC's/PVC's. Serveral short 4-5 beat runs of a-tach. c. 06/2016: Event monitor showing PAC's, PVC's, and brief episodes of PAT.   Marland Kitchen Personal history of colonic polyps    a. colonoscopy 2009  . Personal history of malignant neoplasm of breast   . Pre-diabetes   . Syncope (VasoVagal Faint) 04/27/2010    Current Outpatient Medications  Medication Sig Dispense Refill  . Cholecalciferol (VITAMIN D) 125 MCG (5000 UT) CAPS Take 1 capsule by mouth daily.    . Coenzyme Q10 (COQ10) 100 MG CAPS Take 100 mg by mouth daily. Reported on 07/18/2015    . Digestive Enzymes (DIGESTIVE ENZYME ULTRA PO) Take 1 tablet by mouth daily.    . fish oil-omega-3 fatty acids 1000 MG capsule Take 1 g by mouth daily. Reported on 07/18/2015    . Magnesium 300 MG CAPS Take 1 capsule by mouth 2 (two) times daily.    . Menaquinone-7 (VITAMIN K2  PO) Take 1 tablet by mouth daily.    . mometasone (NASONEX) 50 MCG/ACT nasal spray Place 2 sprays into the nose daily.    Marland Kitchen POTASSIUM CHLORIDE PO Take 90 mcg by mouth every other day.    . Simethicone 250 MG CAPS Take 1 capsule by mouth daily.    Marland Kitchen SYNTHROID 50 MCG tablet TAKE 1 TABLET DAILY ON AN EMPTY STOMACH WITH ONLY WATER FOR 30 MINUTES & NO ANTACID MEDS, CALCIUM OR MAGNESIUM FOR 4 HOURS & AVOID BIOTIN 90 tablet 1  . ALPHA LIPOIC ACID PO Take 1 tablet by mouth 2 (two) times daily. (Patient not taking: Reported on 03/24/2020)    . esomeprazole (NEXIUM) 20 MG capsule Take 1 capsule (20 mg total) by mouth daily at 12 noon. Take for two weeks.  (Patient not taking: Reported on 03/24/2020) 30 capsule 0   No current facility-administered medications for this visit.    Past Surgical History:  Procedure Laterality Date  . BUNIONECTOMY    . CARPAL TUNNEL RELEASE     bilateral   . COLONOSCOPY  07/04/2013   Polyp, Polypectomy. Moderate diverticulosis in L colon. BX: Tubular Adenoma no dyspasia or malignancy. 5 yr rpt colon  . NASAL SINUS SURGERY    . TONSILLECTOMY    . UPPER GASTROINTESTINAL ENDOSCOPY  08/09/2016   normal EGD  . UPPER GASTROINTESTINAL ENDOSCOPY  07/04/2013   normal EGD    Family History  Problem Relation Age of Onset  . Colon cancer Other        Paternal Great Unlce   . Diabetes Paternal Grandmother   . Heart defect Father   . Breast cancer Paternal Aunt     Social History   Tobacco Use  . Smoking status: Never Smoker  . Smokeless tobacco: Never Used  Vaping Use  . Vaping Use: Never used  Substance Use Topics  . Alcohol use: No  . Drug use: No    Allergies  Allergen Reactions  . Iodine Palpitations  . Armour Thyroid [Thyroid]     Palpitations   . Atenolol     presyncope  . Celexa [Citalopram Hydrobromide]     Severe chest tightness   . Diltiazem Hcl     Dropped bp  . Levaquin [Levofloxacin In D5w] Nausea And Vomiting  . Lipitor [Atorvastatin]     Myalgias   . Propranolol Hcl     Felt bad  . Vytorin [Ezetimibe-Simvastatin]     Myalgias      Review of Systems: All systems reviewed and negative except where noted in HPI.    Physical Exam:     BP 132/80   Pulse 76   Ht 5\' 2"  (1.575 m)   Wt 110 lb 6 oz (50.1 kg)   BMI 20.19 kg/m  HEENT: No jaundice, no pallor Respiratory: Bilateral clear. CVS: S1-S2 normal no S3 or S4. Abdo: Soft nontender bowel sounds are present no definite hepatosplenomegaly.   CBC Latest Ref Rng & Units 11/29/2018 05/09/2018 07/15/2016  WBC 3.8 - 10.8 Thousand/uL 7.0 7.2 6.0  Hemoglobin 11.7 - 15.5 g/dL 07/17/2016 08.6 57.8  Hematocrit 35.0 - 45.0 % 39.2  38.3 39.1  Platelets 140 - 400 Thousand/uL 350 356 324   CMP Latest Ref Rng & Units 11/29/2018 05/09/2018 07/15/2016  Glucose 65 - 99 mg/dL 89 86 73  BUN 7 - 25 mg/dL 16 17 11   Creatinine 0.60 - 0.93 mg/dL 07/17/2016 6.29  Sodium 135 - 146 mmol/L 142 142 140  Potassium 3.5 -  5.3 mmol/L 4.2 4.5 4.3  Chloride 98 - 110 mmol/L 106 104 102  CO2 20 - 32 mmol/L 27 28 25   Calcium 8.6 - 10.4 mg/dL 9.8 10.1 9.9  Total Protein 6.1 - 8.1 g/dL 7.1 7.1 6.9  Total Bilirubin 0.2 - 1.2 mg/dL 0.4 0.3 0.4  Alkaline Phos 33 - 130 U/L - - 102  AST 10 - 35 U/L 20 27 21   ALT 6 - 29 U/L 21 46(H) 16        Shunna Mikaelian,MD 03/24/2020, 10:32 AM   CC Unk Pinto, MD

## 2020-03-24 NOTE — Patient Instructions (Addendum)
If you are age 74 or older, your body mass index should be between 23-30. Your Body mass index is 20.19 kg/m. If this is out of the aforementioned range listed, please consider follow up with your Primary Care Provider.  If you are age 74 or younger, your body mass index should be between 19-25. Your Body mass index is 20.19 kg/m. If this is out of the aformentioned range listed, please consider follow up with your Primary Care Provider.   You have been scheduled for a CT scan of the abdomen and pelvis at  [facility]. You are scheduled on 03/26/2020 at 9:00am. You should arrive 15 minutes prior to your appointment time for registration. Please follow the written instructions below on the day of your exam:  WARNING: IF YOU ARE ALLERGIC TO IODINE/X-RAY DYE, PLEASE NOTIFY RADIOLOGY IMMEDIATELY AT [phone]! YOU WILL BE GIVEN A 13 HOUR PREMEDICATION PREP.  1) Do not eat or drink anything after 5:00am (4 hours prior to your test) 2) You have been given 2 bottles of oral contrast to drink. The solution may taste better if refrigerated, but do NOT add ice or any other liquid to this solution. Shake well before drinking.    Drink 1 bottle of contrast @ 7:00am (2 hours prior to your exam)  Drink 1 bottle of contrast @ 8:00am (1 hour prior to your exam)  You may take any medications as prescribed with a small amount of water, if necessary. If you take any of the following medications: METFORMIN, GLUCOPHAGE, GLUCOVANCE, AVANDAMET, RIOMET, FORTAMET, [place] MET, JANUMET, GLUMETZA or METAGLIP, you MAY be asked to HOLD this medication 48 hours AFTER the exam.  The purpose of you drinking the oral contrast is to aid in the visualization of your intestinal tract. The contrast solution may cause some diarrhea. Depending on your individual set of symptoms, you may also receive an intravenous injection of x-ray contrast/dye. Plan on being at [facility] for 30 minutes or longer, depending on the  type of exam you are having performed.  This test typically takes 30-45 minutes to complete.  If you have any questions regarding your exam or if you need to reschedule, you may call the CT department at [phone] between the hours of 8:00 am and 5:00 pm, Monday-Friday.  ________________________________________________________________________  Dennis Bast have been given samples of Xifaxin please hold off taking them until we advise you.  Continue taking Gas-X at bedtime  Please go to the 2nd floor [address], [facility] to have bloodwork.   Due to recent changes in healthcare laws, you may see the results of your imaging and laboratory studies on MyChart before your provider has had a chance to review them.  We understand that in some cases there may be results that are confusing or concerning to you. Not all laboratory results come back in the same time frame and the provider may be waiting for multiple results in order to interpret others.  Please give [place] 48 hours in order for your provider to thoroughly review all the results before contacting the office for clarification of your results.   It was a pleasure to see you today!  Jackquline Denmark, M.D.

## 2020-03-25 ENCOUNTER — Other Ambulatory Visit (INDEPENDENT_AMBULATORY_CARE_PROVIDER_SITE_OTHER): Payer: PPO

## 2020-03-25 ENCOUNTER — Other Ambulatory Visit: Payer: Self-pay

## 2020-03-25 DIAGNOSIS — K6389 Other specified diseases of intestine: Secondary | ICD-10-CM | POA: Diagnosis not present

## 2020-03-25 DIAGNOSIS — R1013 Epigastric pain: Secondary | ICD-10-CM | POA: Diagnosis not present

## 2020-03-26 ENCOUNTER — Ambulatory Visit (HOSPITAL_BASED_OUTPATIENT_CLINIC_OR_DEPARTMENT_OTHER)
Admission: RE | Admit: 2020-03-26 | Discharge: 2020-03-26 | Disposition: A | Discharge status: Home / Self Care | Payer: PPO | Source: Ambulatory Visit | Attending: Gastroenterology | Admitting: Gastroenterology

## 2020-03-26 ENCOUNTER — Encounter (HOSPITAL_BASED_OUTPATIENT_CLINIC_OR_DEPARTMENT_OTHER): Payer: Self-pay

## 2020-03-26 DIAGNOSIS — K6389 Other specified diseases of intestine: Secondary | ICD-10-CM | POA: Diagnosis not present

## 2020-03-26 DIAGNOSIS — R109 Unspecified abdominal pain: Secondary | ICD-10-CM | POA: Diagnosis not present

## 2020-03-26 DIAGNOSIS — R1013 Epigastric pain: Secondary | ICD-10-CM | POA: Diagnosis not present

## 2020-03-26 MED ORDER — IOHEXOL 300 MG/ML  SOLN
100.0000 mL | Freq: Once | INTRAMUSCULAR | Status: AC | PRN
  Administered 2020-03-26: 100 mL via INTRAVENOUS

## 2020-03-31 LAB — PANCREATIC ELASTASE, FECAL: Pancreatic Elastase-1, Stool: >500 mcg/g

## 2020-04-02 ENCOUNTER — Encounter: Payer: Self-pay | Admitting: Gastroenterology

## 2020-04-02 ENCOUNTER — Telehealth: Payer: Self-pay | Admitting: Gastroenterology

## 2020-04-04 NOTE — Telephone Encounter (Signed)
Patient calling back, states she is in need of rx refill please.

## 2020-04-05 ENCOUNTER — Other Ambulatory Visit: Payer: Self-pay | Admitting: Gastroenterology

## 2020-04-10 NOTE — Telephone Encounter (Signed)
A message was left with patient that our drug rep will be coming by the office to drop off samples. We will contact her once we receive them.

## 2020-04-10 NOTE — Telephone Encounter (Signed)
This is the one that Dr. Lyndel Safe said to try to get samples for.  I texted Alyse Low and told her he wanted her to stop by

## 2020-04-28 NOTE — Telephone Encounter (Signed)
Patient calling to check on status of Xifaxan medication samples.

## 2020-04-28 NOTE — Telephone Encounter (Signed)
Spoke to patient's husband who will inform patient that our office is out of Xifaxin samples.

## 2020-05-05 ENCOUNTER — Telehealth: Payer: Self-pay | Admitting: Gastroenterology

## 2020-05-05 NOTE — Telephone Encounter (Signed)
Pt called to request prescription for X[facility]an sent to [facility] since we do not have samples available. Pt states that Dr. Lyndel Safe wanted pt to try Xifaxan for 14 days but only gave her samples for 7. Pt still has 3 pills left.

## 2020-05-05 NOTE — Telephone Encounter (Signed)
Would you like this sent to specialty pharmacy for the additional 7 pills?

## 2020-05-07 ENCOUNTER — Ambulatory Visit: Payer: PPO | Admitting: Gastroenterology

## 2020-05-08 NOTE — Telephone Encounter (Signed)
Lets do rifaximin 550 mg p.o. 3 times daily x 2 weeks, then stop RG

## 2020-05-12 MED ORDER — RIFAXIMIN 550 MG PO TABS: 550.0000 mg | ORAL_TABLET | Freq: Three times a day (TID) | ORAL | 0 refills | Status: AC

## 2020-05-12 NOTE — Addendum Note (Signed)
Addended by: Karena Addison on: 05/12/2020 09:03 AM   Modules accepted: Orders

## 2020-05-12 NOTE — Telephone Encounter (Signed)
I have sent in prescription.

## 2020-05-19 ENCOUNTER — Other Ambulatory Visit: Payer: Self-pay | Admitting: Internal Medicine

## 2020-05-19 DIAGNOSIS — E039 Hypothyroidism, unspecified: Secondary | ICD-10-CM

## 2020-06-10 ENCOUNTER — Encounter: Payer: PPO | Admitting: Internal Medicine

## 2020-06-19 DIAGNOSIS — H35111 Retinopathy of prematurity, stage 0, right eye: Secondary | ICD-10-CM | POA: Diagnosis not present

## 2020-06-24 ENCOUNTER — Telehealth: Payer: Self-pay | Admitting: Cardiology

## 2020-06-24 NOTE — Telephone Encounter (Signed)
Patient c/o Palpitations:  High priority if patient c/o lightheadedness, shortness of breath, or chest pain  1) How long have you had palpitations/irregular HR/ Afib? Are you having the symptoms now? Irregular heart rhythm 2) - been going on for the last 2 or 3 months, getting worse  3) Are you currently experiencing lightheadedness, SOB or CP? lightheaded every day  4) Do you have a history of afib (atrial fibrillation) or irregular heart rhythm?   5) Have you checked your BP or HR? (document readings if available): when this happen, blood pressure goes up  6) Are you experiencing any other symptoms? Real tired, wake up tired- pt wants to be seen asap

## 2020-06-24 NOTE — Telephone Encounter (Signed)
Spoke with pt who report for the past 2-3 months she's been experiencing episodes where she feels like her heart rhythm is irregular. She state it feels like it skipping a beat. Pt describes occasional light headedness and feeling  really bad during episodes. Pt also report sometimes waking up feeling like her heart is rapid.  Appointment scheduled for 4/8 with Sheela Stack, NP for further evaluations.

## 2020-06-25 DIAGNOSIS — E119 Type 2 diabetes mellitus without complications: Secondary | ICD-10-CM | POA: Diagnosis not present

## 2020-06-25 DIAGNOSIS — E559 Vitamin D deficiency, unspecified: Secondary | ICD-10-CM | POA: Diagnosis not present

## 2020-06-25 DIAGNOSIS — D518 Other vitamin B12 deficiency anemias: Secondary | ICD-10-CM | POA: Diagnosis not present

## 2020-06-25 DIAGNOSIS — I10 Essential (primary) hypertension: Secondary | ICD-10-CM | POA: Diagnosis not present

## 2020-06-25 DIAGNOSIS — E782 Mixed hyperlipidemia: Secondary | ICD-10-CM | POA: Diagnosis not present

## 2020-06-25 DIAGNOSIS — E038 Other specified hypothyroidism: Secondary | ICD-10-CM | POA: Diagnosis not present

## 2020-06-26 ENCOUNTER — Ambulatory Visit (INDEPENDENT_AMBULATORY_CARE_PROVIDER_SITE_OTHER): Payer: PPO | Admitting: Gastroenterology

## 2020-06-26 ENCOUNTER — Encounter: Payer: Self-pay | Admitting: Gastroenterology

## 2020-06-26 ENCOUNTER — Other Ambulatory Visit: Payer: Self-pay

## 2020-06-26 VITALS — BP 120/72 | HR 78 | Ht 62.0 in | Wt 107.0 lb

## 2020-06-26 DIAGNOSIS — K449 Diaphragmatic hernia without obstruction or gangrene: Secondary | ICD-10-CM | POA: Diagnosis not present

## 2020-06-26 DIAGNOSIS — K581 Irritable bowel syndrome with constipation: Secondary | ICD-10-CM | POA: Diagnosis not present

## 2020-06-26 DIAGNOSIS — K219 Gastro-esophageal reflux disease without esophagitis: Secondary | ICD-10-CM | POA: Diagnosis not present

## 2020-06-26 DIAGNOSIS — R1012 Left upper quadrant pain: Secondary | ICD-10-CM | POA: Diagnosis not present

## 2020-06-26 DIAGNOSIS — R140 Abdominal distension (gaseous): Secondary | ICD-10-CM

## 2020-06-26 DIAGNOSIS — Z86010 Personal history of colonic polyps: Secondary | ICD-10-CM

## 2020-06-26 DIAGNOSIS — R1013 Epigastric pain: Secondary | ICD-10-CM | POA: Diagnosis not present

## 2020-06-26 NOTE — Patient Instructions (Signed)
If you are age 74 or older, your body mass index should be between 23-30. Your Body mass index is 19.57 kg/m. If this is out of the aforementioned range listed, please consider follow up with your Primary Care Provider.  If you are age 50 or younger, your body mass index should be between 19-25. Your Body mass index is 19.57 kg/m. If this is out of the aformentioned range listed, please consider follow up with your Primary Care Provider.   Please purchase the following medications over the counter and take as directed: Miralax 17g daily  Keep appt with cardiology.  Please call with any questions or concerns.  Thank you,  Dr. Jackquline Denmark

## 2020-06-26 NOTE — Progress Notes (Signed)
IMPRESSION and PLAN:    #1. Epi pain. Neg Mesenteric doppler 09/2019, Neg EGD, GES, CT AP as below  #2. 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 IBS-C with bloating/LUQ pain. 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  Neg CT 07/2015, 03/2020 except for redundant stool filled colon, neg EGD with SB Bx 08/2018 and colon 07/2018 except for TAs. Neg celiac screen and Nl TSH.  #4. H/O tubular adenomas 07/2018. Next due 07/2023.  #5. GERD with small HH.   Plan:  -Miralax 17g po qd. -Gas X prn TID -CT reviewed with the patient. -If still with problems, get sitz marker study followed by trial of montegrity. -Has appt with Cardio. -FU prn      HPI:    Chief Complaint:   Andrea Bates is a 75 y.o. female  For follow-up visit.  Complains of LUQ/epi abdo pain with bloating and palpitations.  She has appt with cardiology in coming days.  Underwent CT scan Abdo/pelvis with p.o. and IV contrast 03/26/2020 which was neg.  On review she did have significant stool in the transverse colon and in splenic flexure.  Has constipation with pellet-like stools.  The abdominal pain is not there in the morning when she wakes up.  It is more prominent in the evening hours.  She has been tested positive for SIBO breath test in past.  She was initially treated with rifaximin with good results.  Subsequently did not have much benefit with Flagyl.  She was given another course of rifaximin which did not help much.  No weight loss.  Wt Readings from Last 3 Encounters:  06/26/20 107 lb (48.5 kg)  03/24/20 110 lb 6 oz (50.1 kg)  09/18/19 107 lb 6 oz (48.7 kg)       Prev GI WU:  Colon 07/2018 -Two 4 to 6 mm polyps in the ascending colon, removed with a cold snare. Resected and retrieved. -Moderate sigmoid diverticulosis. -Otherwise normal colonoscopy to TI. The colon was highly redundant.  EGD 07/2018 Small HH, gastric fundic gland polyp.  Neg SB Bx for celiac, neg gastric Bx for HP.    CT AP  03/26/2020 No CT findings of the abdomen or pelvis to explain abdominal pain. Aortic Atherosclerosis (ICD10-I70.0).  Solid-phase gastric emptying scan 07/2016: neg  UGI series 07/2015: neg   Past Medical History:  Diagnosis Date  . Chest pain    a. negative myoview 2011 [name]);  b. 04/2010 Cath: normal cors  . Colon polyps   . Diverticulosis   . GERD (gastroesophageal reflux disease)    a. mild esophagitis on egd 2012  . Hemorrhoids   . Hiatal hernia   . Hyperlipidemia   . Hyperlipidemia    Is now taking Fish oil. Had myalgias with Vytorin and Lipitor  . Hypothyroidism   . Irritable bowel syndrome   . Palpitations    a. 04/2008 echo: EF 50-60%;  b. 21 day event monitor 2012 - occasional sinus tachycardia with rates 110-120's. Rare PAC's/PVC's. Serveral short 4-5 beat runs of a-tach. c. 06/2016: Event monitor showing PAC's, PVC's, and brief episodes of PAT.   Marland Kitchen Personal history of colonic polyps    a. colonoscopy 2009  . Personal history of malignant neoplasm of breast   . Pre-diabetes   . Syncope (VasoVagal Faint) 04/27/2010    Current Outpatient Medications  Medication Sig Dispense Refill  . Cholecalciferol (VITAMIN D) 125 MCG (5000 UT) CAPS Take 1 capsule by mouth daily.    . Coenzyme  Q10 (COQ10) 100 MG CAPS Take 100 mg by mouth daily. Reported on 07/18/2015    . Digestive Enzymes (DIGESTIVE ENZYME ULTRA PO) Take 1 tablet by mouth daily.    . fish oil-omega-3 fatty acids 1000 MG capsule Take 1 g by mouth daily. Reported on 07/18/2015    . Magnesium 300 MG CAPS Take 1 capsule by mouth 2 (two) times daily.    . Menaquinone-7 (VITAMIN K2 PO) Take 1 tablet by mouth daily.    . mometasone (NASONEX) 50 MCG/ACT nasal spray Place 2 sprays into the nose daily.    Marland Kitchen POTASSIUM CHLORIDE PO Take 90 mcg by mouth every other day.    . Simethicone 250 MG CAPS Take 1 capsule by mouth daily.    Marland Kitchen SYNTHROID 50 MCG tablet TAKE 1 TABLET DAILY ON AN EMPTY STOMACH WITH ONLY WATER FOR 30 MINUTES & NO  ANTACID MEDS, CALCIUM OR MAGNESIUM FOR 4 HOURS & AVOID BIOTIN 90 tablet 1  . ALPHA LIPOIC ACID PO Take 1 tablet by mouth 2 (two) times daily. (Patient not taking: No sig reported)     No current facility-administered medications for this visit.    Past Surgical History:  Procedure Laterality Date  . BUNIONECTOMY    . CARPAL TUNNEL RELEASE     bilateral   . COLONOSCOPY  07/04/2013   Polyp, Polypectomy. Moderate diverticulosis in L colon. BX: Tubular Adenoma no dyspasia or malignancy. 5 yr rpt colon  . NASAL SINUS SURGERY    . TONSILLECTOMY    . UPPER GASTROINTESTINAL ENDOSCOPY  08/09/2016   normal EGD  . UPPER GASTROINTESTINAL ENDOSCOPY  07/04/2013   normal EGD    Family History  Problem Relation Age of Onset  . Colon cancer Other        Paternal Great Unlce   . Diabetes Paternal Grandmother   . Heart defect Father   . Breast cancer Paternal Aunt     Social History   Tobacco Use  . Smoking status: Never Smoker  . Smokeless tobacco: Never Used  Vaping Use  . Vaping Use: Never used  Substance Use Topics  . Alcohol use: No  . Drug use: No    Allergies  Allergen Reactions  . Iodine Palpitations  . Armour Thyroid [Thyroid]     Palpitations   . Atenolol     presyncope  . Celexa [Citalopram Hydrobromide]     Severe chest tightness   . Diltiazem Hcl     Dropped bp  . Levaquin [Levofloxacin In D5w] Nausea And Vomiting  . Lipitor [Atorvastatin]     Myalgias   . Propranolol Hcl     Felt bad  . Shellfish Allergy   . Vytorin [Ezetimibe-Simvastatin]     Myalgias      Review of Systems: All systems reviewed and negative except where noted in HPI.    Physical Exam:     BP 120/72 (BP Location: Left Arm, Patient Position: Sitting, Cuff Size: Normal)   Pulse 78   Ht 5\' 2"  (1.575 m)   Wt 107 lb (48.5 kg)   BMI 19.57 kg/m  HEENT: No jaundice, no pallor Respiratory: Bilateral clear. CVS: S1-S2 normal no S3 or S4. Abdo: Soft nontender bowel sounds are  present no definite hepatosplenomegaly.   CBC Latest Ref Rng & Units 03/24/2020 11/29/2018 05/09/2018  WBC 4.0 - 10.5 K/uL 7.7 7.0 7.2  Hemoglobin 12.0 - 15.0 g/dL 13.6 13.4 12.9  Hematocrit 36.0 - 46.0 % 40.2 39.2 38.3  Platelets 150.0 -  400.0 K/uL 359.0 350 356   CMP Latest Ref Rng & Units 03/24/2020 11/29/2018 05/09/2018  Glucose 70 - 99 mg/dL 88 89 86  BUN 6 - 23 mg/dL 19 16 17   Creatinine 0.40 - 1.20 mg/dL 0.75 0.74 0.74  Sodium 135 - 145 mEq/L 138 142 142  Potassium 3.5 - 5.1 mEq/L 4.7 4.2 4.5  Chloride 96 - 112 mEq/L 102 106 104  CO2 19 - 32 mEq/L 28 27 28   Calcium 8.4 - 10.5 mg/dL 9.9 9.8 10.1  Total Protein 6.0 - 8.3 g/dL 7.1 7.1 7.1  Total Bilirubin 0.2 - 1.2 mg/dL 0.3 0.4 0.3  Alkaline Phos 39 - 117 U/L 127(H) - -  AST 0 - 37 U/L 24 20 27   ALT 0 - 35 U/L 35 21 46(H)        Glennon Kopko,MD 06/26/2020, 10:59 AM   CC Unk Pinto, MD

## 2020-06-26 NOTE — Progress Notes (Signed)
Cardiology Clinic Note   Patient Name: Andrea Bates Date of Encounter: 06/27/2020  Primary Care Provider:  Unk Pinto, Bates Primary Cardiologist:  Andrea Ruths, Bates  Patient Profile    Andrea Bates 75 year old female presents the clinic today for evaluation of her palpitations.  Past Medical History    Past Medical History:  Diagnosis Date  . Chest pain    a. negative myoview 2011 Andrea Bates);  b. 04/2010 Cath: normal cors  . Colon polyps   . Diverticulosis   . GERD (gastroesophageal reflux disease)    a. mild esophagitis on egd 2012  . Hemorrhoids   . Hiatal hernia   . Hyperlipidemia   . Hyperlipidemia    Is now taking Fish oil. Had myalgias with Vytorin and Lipitor  . Hypothyroidism   . Irritable bowel syndrome   . Palpitations    a. 04/2008 echo: EF 50-60%;  b. 21 day event monitor 2012 - occasional sinus tachycardia with rates 110-120's. Rare PAC's/PVC's. Serveral short 4-5 beat runs of a-tach. c. 06/2016: Event monitor showing PAC's, PVC's, and brief episodes of PAT.   Marland Kitchen Personal history of colonic polyps    a. colonoscopy 2009  . Personal history of malignant neoplasm of breast   . Pre-diabetes   . Syncope (VasoVagal Faint) 04/27/2010   Past Surgical History:  Procedure Laterality Date  . BUNIONECTOMY    . CARPAL TUNNEL RELEASE     bilateral   . COLONOSCOPY  07/04/2013   Polyp, Polypectomy. Moderate diverticulosis in L colon. BX: Tubular Adenoma no dyspasia or malignancy. 5 yr rpt colon  . NASAL SINUS SURGERY    . TONSILLECTOMY    . UPPER GASTROINTESTINAL ENDOSCOPY  08/09/2016   normal EGD  . UPPER GASTROINTESTINAL ENDOSCOPY  07/04/2013   normal EGD    Allergies  Allergies  Allergen Reactions  . Iodine Palpitations  . Armour Thyroid [Thyroid]     Palpitations   . Atenolol     presyncope  . Celexa [Citalopram Hydrobromide]     Severe chest tightness   . Diltiazem Hcl     Dropped bp  . Levaquin [Levofloxacin In D5w] Nausea And  Vomiting  . Lipitor [Atorvastatin]     Myalgias   . Propranolol Hcl     Felt bad  . Shellfish Allergy   . Vytorin [Ezetimibe-Simvastatin]     Myalgias     History of Present Illness    Andrea Bates has a PMH of aortic atherosclerosis, dysphagia, GERD, hypothyroidism, hyperlipidemia, constipation, vitamin D deficiency, and vertigo.  Her PMH also includes COVID-19 infection and palpitations.  She had a cardiac catheterization in February 2012 that showed normal coronary arteries, and a normal LV function.  Stress echocardiogram in April 2015 was normal. Last echocardiogram obtained in May 2018 showed normal LV function, grade 1 DD, mildly dilated aortic root.  Heart monitor May 2018 showed sinus rhythm, PACs, PVCs and brief PAT. She is intolerant of beta-blocker and calcium channel blocker and also intolerant of statins.   She was  seen by Andrea Bates April 2019, at which time she continued to describe occasional palpitation.  She declined a trial on the beta-blocker at the time.  Her hyperlipidemia is managed by diet and exercise and did not wish to try Zetia at the time.   08/25/2018 she stated she had noticed increased palpitations over the last 1 to 2 weeks.  She was noticing some increased shortness of breath but, stated that it was intermittent and she  was still exercising 3-4 times a week.  She again noted that she did not tolerate beta blocking agents well.  She endorsed mild to moderate caffeine use, and consuming chocolate covered almonds.  She denies chest pain, lower extremity edema, orthopnea and PND.  Her echocardiogram at that time showed ejection fraction of 60 to 65%, impaired relaxation, mild thickening of mitral valve leaflet, and dilation of the ascending aorta measuring 40 mm.  EKG 08/28/2018 showed sinus rhythm with PVCs 69 bpm.  She presents the clinic today for follow-up evaluation states she has noticed more palpitations over the last several months.  She is intolerant of  beta blocking agents.  She has done her best to eliminate triggers from her diet as well as foods that may cause gas.  She is following with GI who would like to do a camera study to further evaluate her colon.  She reports that she feels the pressure and gas may be contributing to her awareness of her palpitations.  She continues to be physically active walking on her treadmill 20-30 minutes and doing housework.  We will give her the salty 6 diet sheet, have her maintain her physical activity, and follow-up in 12 months.  Today she denies chest pain, shortness of breath, lower extremity edema, fatigue, melena, hematuria, hemoptysis, diaphoresis, weakness, presyncope, syncope, orthopnea, and PND.  Home Medications    Prior to Admission medications   Medication Sig Start Date End Date Taking? Authorizing Provider  ALPHA LIPOIC ACID PO Take 1 tablet by mouth 2 (two) times daily. Patient not taking: Reported on 03/24/2020    Andrea Bates  Cholecalciferol (VITAMIN D) 125 MCG (5000 UT) CAPS Take 1 capsule by mouth daily.    Andrea Bates  Coenzyme Q10 (COQ10) 100 MG CAPS Take 100 mg by mouth daily. Reported on 07/18/2015    Andrea Bates  Digestive Enzymes (DIGESTIVE ENZYME ULTRA PO) Take 1 tablet by mouth daily.    Andrea Bates  esomeprazole (NEXIUM) 20 MG capsule TAKE 1 CAPSULE (20 MG TOTAL) BY MOUTH DAILY AT 12 NOON. TAKE FOR TWO WEEKS. 04/07/20   Andrea Denmark, Bates  fish oil-omega-3 fatty acids 1000 MG capsule Take 1 g by mouth daily. Reported on 07/18/2015    Andrea Bates  Magnesium 300 MG CAPS Take 1 capsule by mouth 2 (two) times daily.    Andrea Bates  Menaquinone-7 (VITAMIN K2 PO) Take 1 tablet by mouth daily.    Andrea Bates  mometasone (NASONEX) 50 MCG/ACT nasal spray Place 2 sprays into the nose daily.    Andrea Bates  POTASSIUM CHLORIDE PO Take 90 mcg by mouth every other day.    Andrea Bates   predniSONE (DELTASONE) 50 MG tablet Take 1  tablet  13 hours before your cat scan Take 1 tablet 7 hours before your CT scan Take 1 tablet 1 hour before CT scan. 03/24/20   Andrea Denmark, Bates  Simethicone 250 MG CAPS Take 1 capsule by mouth daily.    Andrea Bates  SYNTHROID 50 MCG tablet TAKE 1 TABLET DAILY ON AN EMPTY STOMACH WITH ONLY WATER FOR 30 MINUTES & NO ANTACID MEDS, CALCIUM OR MAGNESIUM FOR 4 HOURS & AVOID BIOTIN 05/19/20   Liane Comber, NP    Family History    Family History  Problem Relation Age of Onset  . Colon cancer Other        Paternal Great Unlce   . Diabetes Paternal Grandmother   .  Heart defect Father   . Breast cancer Paternal Aunt    She indicated that her mother is deceased. She indicated that her father is deceased. She indicated that her maternal grandmother is deceased. She indicated that her maternal grandfather is deceased. She indicated that her paternal grandmother is deceased. She indicated that her paternal grandfather is deceased. She indicated that the status of her paternal aunt is unknown. She indicated that the status of her other is unknown.  Social History    Social History   Socioeconomic History  . Marital status: Married    Spouse name: [name]  . Number of children: 2  . Years of education: Not on file  . Highest education level: Not on file  Occupational History  . Occupation: Retired    Comment: Freight forwarder: [employer]  Tobacco Use  . Smoking status: Never Smoker  . Smokeless tobacco: Never Used  Vaping Use  . Vaping Use: Never used  Substance and Sexual Activity  . Alcohol use: No  . Drug use: No  . Sexual activity: Not on file  Other Topics Concern  . Not on file  Social History Narrative  . Not on file   Social Determinants of Health   Financial Resource Strain: Not on file  Food Insecurity: Not on file  Transportation Needs: Not on file  Physical Activity: Not on file  Stress: Not on  file  Social Connections: Not on file  Intimate Partner Violence: Not on file     Review of Systems    General:  No chills, fever, night sweats or weight changes.  Cardiovascular:  No chest pain, dyspnea on exertion, edema, orthopnea, palpitations, paroxysmal nocturnal dyspnea. Dermatological: No rash, lesions/masses Respiratory: No cough, dyspnea Urologic: No hematuria, dysuria Abdominal:   No nausea, vomiting, diarrhea, bright red blood per rectum, melena, or hematemesis Neurologic:  No visual changes, wkns, changes in mental status. All other systems reviewed and are otherwise negative except as noted above.  Physical Exam    VS:  BP (!) 142/76   Pulse 78   Ht 5\' 2"  (1.575 m)   Wt 107 lb (48.5 kg)   SpO2 98%   BMI 19.57 kg/m  , BMI Body mass index is 19.57 kg/m. GEN: Well nourished, well developed, in no acute distress. HEENT: normal. Neck: Supple, no JVD, carotid bruits, or masses. Cardiac: RRR, no murmurs, rubs, or gallops. No clubbing, cyanosis, edema.  Radials/DP/PT 2+ and equal bilaterally.  Respiratory:  Respirations regular and unlabored, clear to auscultation bilaterally. GI: Soft, nontender, nondistended, BS + x 4. MS: no deformity or atrophy. Skin: warm and dry, no rash. Neuro:  Strength and sensation are intact. Psych: Normal affect.  Accessory Clinical Findings    Recent Labs: 03/24/2020: ALT 35; BUN 19; Creatinine, Ser 0.75; Hemoglobin 13.6; Platelets 359.0; Potassium 4.7; Sodium 138   Recent Lipid Panel    Component Value Date/Time   CHOL 285 (H) 11/29/2018 1238   CHOL 276 (H) 06/10/2014 1242   TRIG 78 11/29/2018 1238   TRIG 61 06/10/2014 1242   HDL 84 11/29/2018 1238   HDL 80 06/10/2014 1242   CHOLHDL 3.4 11/29/2018 1238   VLDL 11 07/16/2015 0914   LDLCALC 183 (H) 11/29/2018 1238   LDLCALC 184 (H) 06/10/2014 1242   LDLDIRECT 180.1 02/01/2013 1608    ECG personally reviewed by me today-sinus rhythm with possible premature atrial complexes and  premature ventricular complexes 78 bpm- No acute changes  Echocardiogram 09/15/2018 IMPRESSIONS    1. The left ventricle has normal systolic function with an ejection  fraction of 60-65%. The cavity size was normal. Left ventricular diastolic  Doppler parameters are consistent with impaired relaxation. Indeterminate  filling pressures The E/e' is 8-15.  No evidence of left ventricular regional wall motion abnormalities.  2. The right ventricle has normal systolic function. The cavity was  normal. There is no increase in right ventricular wall thickness.  3. The mitral valve is abnormal. Mild thickening of the mitral valve  leaflet.  4. The tricuspid valve is grossly normal.  5. The aortic valve is tricuspid. No stenosis of the aortic valve.  6. There is mild dilatation of the ascending aorta measuring 40 mm.  7. The average left ventricular global longitudinal strain is -21.7 %.  Assessment & Plan   1.  Palpitations-EKG today shows sinus rhythm with premature atrial complexes and premature ventricular complexes and fusion complexes 78 bpm.  She is planning to do a camera study with GI.  Believes that increased gas is contributing to more awareness of her symptoms of palpitations.  Previous EKG showed normal sinus rhythm with PVCs rate of 69.  Echocardiogram showed normal LVEF with impaired relaxation.  Details above.  She was previously educated about limiting caffeine and chocolate.  EKG today shows sinus rhythm with possible PACs and PVCs or fusion complexes 78 bpm. Heart healthy low-sodium diet-salty 6 given Increase physical activity as tolerated Avoid triggers caffeine, chocolate, EtOH, dehydration etc.  Shortness of breath-stable.  Reports she continues to walk regularly.  Notes normal brain normal daily activities increased shortness of breath with increased physical activity. Increase physical activity as tolerated  Fatigue-somewhat improved.  Continue walking and other  physical activities.  CBC 03/24/2020 unremarkable. Heart healthy low-sodium diet-salty 6 given Increase physical activity as tolerated  Hypothyroidism-TSH 1.39 on 11/29/2018 Continue Synthroid Follows with PCP    Disposition: Follow-up with Andrea Bates in 12 months.  Jossie Ng. Dawsyn Ramsaran NP-C    06/27/2020, 10:11 AM [place] [address] Office [phone] Fax [phone]  Notice: This dictation was prepared with Dragon dictation along with smaller phrase technology. Any transcriptional errors that result from this process are unintentional and may not be corrected upon review.  I spent 71minutes examining this patient, reviewing medications, and using patient centered shared decision making involving her cardiac care.  Prior to her visit I spent greater than 20 minutes reviewing her past medical history,  medications, and prior cardiac tests.

## 2020-06-27 ENCOUNTER — Other Ambulatory Visit: Payer: Self-pay

## 2020-06-27 ENCOUNTER — Ambulatory Visit: Payer: PPO | Admitting: General Practice

## 2020-06-27 ENCOUNTER — Encounter: Payer: Self-pay | Admitting: General Practice

## 2020-06-27 VITALS — BP 142/76 | HR 78 | Ht 62.0 in | Wt 107.0 lb

## 2020-06-27 DIAGNOSIS — R5383 Other fatigue: Secondary | ICD-10-CM

## 2020-06-27 DIAGNOSIS — R0602 Shortness of breath: Secondary | ICD-10-CM | POA: Diagnosis not present

## 2020-06-27 DIAGNOSIS — E038 Other specified hypothyroidism: Secondary | ICD-10-CM | POA: Diagnosis not present

## 2020-06-27 DIAGNOSIS — R002 Palpitations: Secondary | ICD-10-CM | POA: Diagnosis not present

## 2020-06-27 NOTE — Patient Instructions (Signed)
Medication Instructions:  The current medical regimen is effective;  continue present plan and medications as directed. Please refer to the Current Medication list given to you today.  *If you need a refill on your cardiac medications before your next appointment, please call your pharmacy*  Special Instructions PLEASE READ AND FOLLOW SALTY 6-ATTACHED-1,800mg  daily  PLEASE INCREASE PHYSICAL ACTIVITY AS TOLERATED  PLEASE READ AND FOLLOW HEART HEALTHY DIET-ATTACHED  Follow-Up: Your next appointment:  12 month(s) In Person with Kirk Ruths, MD OR IF UNAVAILABLE [place], FNP-C  At [facility], you and your health needs are our priority.  As part of our continuing mission to provide you with exceptional heart care, we have created designated Provider Care Teams.  These Care Teams include your primary Cardiologist (physician) and Advanced Practice Providers (APPs -  Physician Assistants and Nurse Practitioners) who all work together to provide you with the care you need, when you need it.            6 SALTY THINGS TO AVOID     1,800MG  DAILY     Heart-Healthy Eating Plan Heart-healthy meal planning includes:  Eating less unhealthy fats.  Eating more healthy fats.  Making other changes in your diet. Talk with your doctor or a diet specialist (dietitian) to create an eating plan that is right for you. What are tips for following this plan? Cooking Avoid frying your food. Try to bake, boil, grill, or broil it instead. You can also reduce fat by:  Removing the skin from poultry.  Removing all visible fats from meats.  Steaming vegetables in water or broth. Meal planning  At meals, divide your plate into four equal parts: ? Fill one-half of your plate with vegetables and green salads. ? Fill one-fourth of your plate with whole grains. ? Fill one-fourth of your plate with lean protein foods.  Eat 4-5 servings of vegetables per day. A serving of vegetables is: ? 1 cup  of raw or cooked vegetables. ? 2 cups of raw leafy greens.  Eat 4-5 servings of fruit per day. A serving of fruit is: ? 1 medium whole fruit. ?  cup of dried fruit. ?  cup of fresh, frozen, or canned fruit. ?  cup of 100% fruit juice.  Eat more foods that have soluble fiber. These are apples, broccoli, carrots, beans, peas, and barley. Try to get 20-30 g of fiber per day.  Eat 4-5 servings of nuts, legumes, and seeds per week: ? 1 serving of dried beans or legumes equals  cup after being cooked. ? 1 serving of nuts is  cup. ? 1 serving of seeds equals 1 tablespoon.   General information  Eat more home-cooked food. Eat less restaurant, buffet, and fast food.  Limit or avoid alcohol.  Limit foods that are high in starch and sugar.  Avoid fried foods.  Lose weight if you are overweight.  Keep track of how much salt (sodium) you eat. This is important if you have high blood pressure. Ask your doctor to tell you more about this.  Try to add vegetarian meals each week. Fats  Choose healthy fats. These include olive oil and canola oil, flaxseeds, walnuts, almonds, and seeds.  Eat more omega-3 fats. These include salmon, mackerel, sardines, tuna, flaxseed oil, and ground flaxseeds. Try to eat fish at least 2 times each week.  Check food labels. Avoid foods with trans fats or high amounts of saturated fat.  Limit saturated fats. ? These are often found  in animal products, such as meats, butter, and cream. ? These are also found in plant foods, such as palm oil, palm kernel oil, and coconut oil.  Avoid foods with partially hydrogenated oils in them. These have trans fats. Examples are stick margarine, some tub margarines, cookies, crackers, and other baked goods. What foods can I eat? Fruits All fresh, canned (in natural juice), or frozen fruits. Vegetables Fresh or frozen vegetables (raw, steamed, roasted, or grilled). Green salads. Grains Most grains. Choose whole wheat  and whole grains most of the time. Rice and pasta, including brown rice and pastas made with whole wheat. Meats and other proteins Lean, well-trimmed beef, veal, pork, and lamb. Chicken and [place] without skin. All fish and shellfish. Wild duck, rabbit, pheasant, and venison. Egg whites or low-cholesterol egg substitutes. Dried beans, peas, lentils, and tofu. Seeds and most nuts. Dairy Low-fat or nonfat cheeses, including ricotta and mozzarella. Skim or 1% milk that is liquid, powdered, or evaporated. Buttermilk that is made with low-fat milk. Nonfat or low-fat yogurt. Fats and oils Non-hydrogenated (trans-free) margarines. Vegetable oils, including soybean, sesame, sunflower, olive, peanut, safflower, corn, canola, and cottonseed. Salad dressings or mayonnaise made with a vegetable oil. Beverages Mineral water. Coffee and tea. Diet carbonated beverages. Sweets and desserts Sherbet, gelatin, and fruit ice. Small amounts of dark chocolate. Limit all sweets and desserts. Seasonings and condiments All seasonings and condiments. The items listed above may not be a complete list of foods and drinks you can eat. Contact a dietitian for more options. What foods should I avoid? Fruits Canned fruit in heavy syrup. Fruit in cream or butter sauce. Fried fruit. Limit coconut. Vegetables Vegetables cooked in cheese, cream, or butter sauce. Fried vegetables. Grains Breads that are made with saturated or trans fats, oils, or whole milk. Croissants. Sweet rolls. Donuts. High-fat crackers, such as cheese crackers. Meats and other proteins Fatty meats, such as hot dogs, ribs, sausage, bacon, rib-eye roast or steak. High-fat deli meats, such as salami and bologna. Caviar. Domestic duck and goose. Organ meats, such as liver. Dairy Cream, sour cream, cream cheese, and creamed cottage cheese. Whole-milk cheeses. Whole or 2% milk that is liquid, evaporated, or condensed. Whole buttermilk. Cream sauce or high-fat  cheese sauce. Yogurt that is made from whole milk. Fats and oils Meat fat, or shortening. Cocoa butter, hydrogenated oils, palm oil, coconut oil, palm kernel oil. Solid fats and shortenings, including bacon fat, salt pork, lard, and butter. Nondairy cream substitutes. Salad dressings with cheese or sour cream. Beverages Regular sodas and juice drinks with added sugar. Sweets and desserts Frosting. Pudding. Cookies. Cakes. Pies. Milk chocolate or white chocolate. Buttered syrups. Full-fat ice cream or ice cream drinks. The items listed above may not be a complete list of foods and drinks to avoid. Contact a dietitian for more information. Summary  Heart-healthy meal planning includes eating less unhealthy fats, eating more healthy fats, and making other changes in your diet.  Eat a balanced diet. This includes fruits and vegetables, low-fat or nonfat dairy, lean protein, nuts and legumes, whole grains, and heart-healthy oils and fats. This information is not intended to replace advice given to you by your health care provider. Make sure you discuss any questions you have with your health care provider. Document Revised: 05/12/2017 Document Reviewed: 04/15/2017 Elsevier Patient Education  2021 [employer].

## 2020-07-01 ENCOUNTER — Telehealth: Payer: Self-pay | Admitting: Cardiology

## 2020-07-01 DIAGNOSIS — R0602 Shortness of breath: Secondary | ICD-10-CM

## 2020-07-01 DIAGNOSIS — R002 Palpitations: Secondary | ICD-10-CM

## 2020-07-01 MED ORDER — METOPROLOL SUCCINATE ER 25 MG PO TB24: ORAL_TABLET | ORAL | 3 refills | Status: DC

## 2020-07-01 NOTE — Telephone Encounter (Signed)
Spoke to patient she stated she continues to have irregular heart beat worse at night.Stated she feels like it is different than before.Stated she saw Coletta Memos NP last week.She wanted to ask him if he would order a repeat echo and prescribe medication to help.Advised I will send message to him for advice.

## 2020-07-01 NOTE — Telephone Encounter (Signed)
Patient called and wanted to further pursue testing that was discussed at her appointment with Coletta Memos 4/8. Please call to discuss testing

## 2020-07-01 NOTE — Telephone Encounter (Signed)
Spoke to patient Andrea Bates's advice given.She will start Metoprolol Succ 25 mg every evening.Scheduler will call back with echo and follow up appointment.

## 2020-07-10 ENCOUNTER — Ambulatory Visit (HOSPITAL_COMMUNITY): Payer: PPO | Attending: Cardiology

## 2020-07-10 ENCOUNTER — Other Ambulatory Visit: Payer: Self-pay

## 2020-07-10 DIAGNOSIS — R0602 Shortness of breath: Secondary | ICD-10-CM | POA: Diagnosis not present

## 2020-07-10 DIAGNOSIS — R002 Palpitations: Secondary | ICD-10-CM | POA: Diagnosis not present

## 2020-07-10 LAB — ECHOCARDIOGRAM COMPLETE
Area-P 1/2: 3.88 cm2
S' Lateral: 2.4 cm

## 2020-07-18 DIAGNOSIS — M79606 Pain in leg, unspecified: Secondary | ICD-10-CM | POA: Diagnosis not present

## 2020-07-18 DIAGNOSIS — R0989 Other specified symptoms and signs involving the circulatory and respiratory systems: Secondary | ICD-10-CM | POA: Diagnosis not present

## 2020-07-21 DIAGNOSIS — R221 Localized swelling, mass and lump, neck: Secondary | ICD-10-CM | POA: Diagnosis not present

## 2020-07-21 DIAGNOSIS — E042 Nontoxic multinodular goiter: Secondary | ICD-10-CM | POA: Diagnosis not present

## 2020-07-22 DIAGNOSIS — I6523 Occlusion and stenosis of bilateral carotid arteries: Secondary | ICD-10-CM | POA: Diagnosis not present

## 2020-07-22 DIAGNOSIS — R0989 Other specified symptoms and signs involving the circulatory and respiratory systems: Secondary | ICD-10-CM | POA: Diagnosis not present

## 2020-07-24 DIAGNOSIS — R229 Localized swelling, mass and lump, unspecified: Secondary | ICD-10-CM | POA: Diagnosis not present

## 2020-07-24 DIAGNOSIS — D492 Neoplasm of unspecified behavior of bone, soft tissue, and skin: Secondary | ICD-10-CM | POA: Diagnosis not present

## 2020-07-27 NOTE — Progress Notes (Signed)
Cardiology Clinic Note   Patient Name: Andrea Bates Date of Encounter: 07/30/2020  Primary Care Provider:  Zoila Shutter, NP Primary Cardiologist:  Andrea Ruths, MD  Patient Profile    Andrea Bates 75 year old female presents the clinic today for follow-up evaluation of her palpitations and to review her echocardiogram.  Past Medical History    Past Medical History:  Diagnosis Date  . Chest pain    a. negative myoview 2011 Andrea Bates);  b. 04/2010 Cath: normal cors  . Colon polyps   . Diverticulosis   . GERD (gastroesophageal reflux disease)    a. mild esophagitis on egd 2012  . Hemorrhoids   . Hiatal hernia   . Hyperlipidemia   . Hyperlipidemia    Is now taking Fish oil. Had myalgias with Vytorin and Lipitor  . Hypothyroidism   . Irritable bowel syndrome   . Palpitations    a. 04/2008 echo: EF 50-60%;  b. 21 day event monitor 2012 - occasional sinus tachycardia with rates 110-120's. Rare PAC's/PVC's. Serveral short 4-5 beat runs of a-tach. c. 06/2016: Event monitor showing PAC's, PVC's, and brief episodes of PAT.   Marland Kitchen Personal history of colonic polyps    a. colonoscopy 2009  . Personal history of malignant neoplasm of breast   . Pre-diabetes   . Syncope (VasoVagal Faint) 04/27/2010   Past Surgical History:  Procedure Laterality Date  . BUNIONECTOMY    . CARPAL TUNNEL RELEASE     bilateral   . COLONOSCOPY  07/04/2013   Polyp, Polypectomy. Moderate diverticulosis in L colon. BX: Tubular Adenoma no dyspasia or malignancy. 5 yr rpt colon  . NASAL SINUS SURGERY    . TONSILLECTOMY    . UPPER GASTROINTESTINAL ENDOSCOPY  08/09/2016   normal EGD  . UPPER GASTROINTESTINAL ENDOSCOPY  07/04/2013   normal EGD    Allergies  Allergies  Allergen Reactions  . Iodine Palpitations  . Armour Thyroid [Thyroid]     Palpitations   . Atenolol     presyncope  . Celexa [Citalopram Hydrobromide]     Severe chest tightness   . Diltiazem Hcl     Dropped bp  .  Levaquin [Levofloxacin In D5w] Nausea And Vomiting  . Lipitor [Atorvastatin]     Myalgias   . Propranolol Hcl     Felt bad  . Shellfish Allergy   . Vytorin [Ezetimibe-Simvastatin]     Myalgias     History of Present Illness    [name] has a PMH of aortic atherosclerosis, dysphagia, GERD, hypothyroidism, hyperlipidemia, constipation, vitamin D deficiency, and vertigo.  Her PMH also includes COVID-19 infection and palpitations.  She had a cardiac catheterization in February 2012 that showed normal coronary arteries, and a normal LV function. Stress echocardiogram in April 2015 was normal. Last echocardiogram obtained in May 2018 showed normal LV function, grade 1 DD, mildly dilated aortic root. Heart monitor May 2018 showed sinus rhythm, PACs, PVCs and brief PAT. She is intolerant of beta-blocker and calcium channel blocker and also intolerant of statins.   She was  seen by Dr. Stanford Bates April 2019, at which time she continued to describe occasional palpitation. She declined a trial on the beta-blocker at the time. Her hyperlipidemia is managed by diet and exerciseand did not wish to try Zetia at the time.   08/25/2018 she stated she had noticed increased palpitations over the last 1 to 2 weeks. She was noticing some increased shortness of breath but, stated that it was intermittent and  she was still exercising 3-4 times a week. She again noted that she did not tolerate beta blocking agents well. She endorsed mild to moderate caffeine use, and consuming chocolate covered almonds. She denies chest pain, lower extremity edema, orthopnea and PND.  Her echocardiogram at that time showed ejection fraction of 60 to 65%, impaired relaxation, mild thickening of mitral valve leaflet, and dilation of the ascending aorta measuring 40 mm.  EKG 08/28/2018 showed sinus rhythm with PVCs 69 bpm.  She presented to the clinic for a 22 for follow-up evaluation stated she had noticed more palpitations  over the last several months.  She was intolerant of beta blocking agents.  She had done her best to eliminate triggers from her diet as well as foods that may cause gas.  She was following with GI who planned a camera study to further evaluate her colon.  She reported that she felt the pressure and gas may be contributing to her awareness of her palpitations.  She continued to be physically active walking on her treadmill 20-30 minutes and doing housework.  We  gave her the salty 6 diet sheet, had her maintain her physical activity, ordering an echocardiogram and planned follow-up in 12 months. Her echocardiogram showed her pumping function was normal. She has grade 1 diastolic dysfunction. No other significant valvular abnormalities were noted  She presents to the clinic today for follow-up evaluation states she has not started her metoprolol succinate.  She reports she is very sensitive to medications.  She has been to see GI for follow-up evaluation and has 2 tests planned.  A camera study and a small intestine bacterial overgrowth study.  We reviewed her echocardiogram.  I encouraged her to start metoprolol succinate 12.5 mg daily to help with her palpitations and reviewed the medication.  We will have her follow-up with Dr. Stanford Bates in 12 months.  Today she denies chest pain, shortness of breath, lower extremity edema, fatigue, melena, hematuria, hemoptysis, diaphoresis, weakness, presyncope, syncope, orthopnea, and PND.   Home Medications    Prior to Admission medications   Medication Sig Start Date End Date Taking? Authorizing Provider  ALPHA LIPOIC ACID PO Take 1 tablet by mouth 2 (two) times daily. Patient not taking: No sig reported    [provider]  Cholecalciferol (VITAMIN D) 125 MCG (5000 UT) CAPS Take 1 capsule by mouth daily.    [provider]  Coenzyme Q10 (COQ10) 100 MG CAPS Take 100 mg by mouth daily. Reported on 07/18/2015    [provider]   Digestive Enzymes (DIGESTIVE ENZYME ULTRA PO) Take 1 tablet by mouth daily.    [provider]  fish oil-omega-3 fatty acids 1000 MG capsule Take 1 g by mouth daily. Reported on 07/18/2015    [provider]  Magnesium 300 MG CAPS Take 1 capsule by mouth 2 (two) times daily.    [provider]  Menaquinone-7 (VITAMIN K2 PO) Take 1 tablet by mouth daily.    [provider]  metoprolol succinate (TOPROL XL) 25 MG 24 hr tablet Take 25 mg every evening 07/01/20   Deberah Pelton, NP  mometasone (NASONEX) 50 MCG/ACT nasal spray Place 2 sprays into the nose daily.    [provider]  POTASSIUM CHLORIDE PO Take 90 mcg by mouth every other day.    [provider]  Simethicone 250 MG CAPS Take 1 capsule by mouth daily.    [provider]  SYNTHROID 50 MCG tablet TAKE 1 TABLET  DAILY ON AN EMPTY STOMACH WITH ONLY WATER FOR 30 MINUTES & NO ANTACID MEDS, CALCIUM OR MAGNESIUM FOR 4 HOURS & AVOID BIOTIN 05/19/20   Liane Comber, NP    Family History    Family History  Problem Relation Age of Onset  . Colon cancer Other        Paternal Great Unlce   . Diabetes Paternal Grandmother   . Heart defect Father   . Breast cancer Paternal Aunt    She indicated that her mother is deceased. She indicated that her father is deceased. She indicated that her maternal grandmother is deceased. She indicated that her maternal grandfather is deceased. She indicated that her paternal grandmother is deceased. She indicated that her paternal grandfather is deceased. She indicated that the status of her paternal aunt is unknown. She indicated that the status of her other is unknown.  Social History    Social History   Socioeconomic History  . Marital status: Married    Spouse name: [name]  . Number of children: 2  . Years of education: Not on file  . Highest education level: Not on file  Occupational History  . Occupation: Retired    Comment: Proofreader: [employer]  Tobacco Use  . Smoking status: Never Smoker  . Smokeless tobacco: Never Used  Vaping Use  . Vaping Use: Never used  Substance and Sexual Activity  . Alcohol use: No  . Drug use: No  . Sexual activity: Not on file  Other Topics Concern  . Not on file  Social History Narrative  . Not on file   Social Determinants of Health   Financial Resource Strain: Not on file  Food Insecurity: Not on file  Transportation Needs: Not on file  Physical Activity: Not on file  Stress: Not on file  Social Connections: Not on file  Intimate Partner Violence: Not on file     Review of Systems    General:  No chills, fever, night sweats or weight changes.  Cardiovascular:  No chest pain, dyspnea on exertion, edema, orthopnea, palpitations, paroxysmal nocturnal dyspnea. Dermatological: No rash, lesions/masses Respiratory: No cough, dyspnea Urologic: No hematuria, dysuria Abdominal:   No nausea, vomiting, diarrhea, bright red blood per rectum, melena, or hematemesis Neurologic:  No visual changes, wkns, changes in mental status. All other systems reviewed and are otherwise negative except as noted above.  Physical Exam    VS:  BP 138/80 (BP Location: Left Arm, Patient Position: Sitting, Cuff Size: Normal)   Pulse 80   Ht 5\' 2"  (1.575 m)   Wt 104 lb 6.4 oz (47.4 kg)   SpO2 96%   BMI 19.10 kg/m  , BMI Body mass index is 19.1 kg/m. GEN: Well nourished, well developed, in no acute distress. HEENT: normal. Neck: Supple, no JVD, carotid bruits, or masses. Cardiac: RRR, no murmurs, rubs, or gallops. No clubbing, cyanosis, edema.  Radials/DP/PT 2+ and equal bilaterally.  Respiratory:  Respirations regular and unlabored, clear to auscultation bilaterally. GI: Soft, nontender, nondistended, BS + x 4. MS: no deformity or atrophy. Skin: warm and dry, no rash. Neuro:  Strength and sensation are intact. Psych: Normal affect.  Accessory Clinical Findings     Recent Labs: 03/24/2020: ALT 35; BUN 19; Creatinine, Ser 0.75; Hemoglobin 13.6; Platelets 359.0; Potassium 4.7; Sodium 138   Recent Lipid Panel    Component Value Date/Time   CHOL 285 (H) 11/29/2018 1238   CHOL 276 (H) 06/10/2014 1242  TRIG 78 11/29/2018 1238   TRIG 61 06/10/2014 1242   HDL 84 11/29/2018 1238   HDL 80 06/10/2014 1242   CHOLHDL 3.4 11/29/2018 1238   VLDL 11 07/16/2015 0914   LDLCALC 183 (H) 11/29/2018 1238   LDLCALC 184 (H) 06/10/2014 1242   LDLDIRECT 180.1 02/01/2013 1608    ECG personally reviewed by me today-none today.  EKG 06/27/2020 sinus rhythm with possible premature atrial complexes and premature ventricular complexes 78 bpm  Echocardiogram 09/15/2018 IMPRESSIONS    1. The left ventricle has normal systolic function with an ejection  fraction of 60-65%. The cavity size was normal. Left ventricular diastolic  Doppler parameters are consistent with impaired relaxation. Indeterminate  filling pressures The E/e' is 8-15.  No evidence of left ventricular regional wall motion abnormalities.  2. The right ventricle has normal systolic function. The cavity was  normal. There is no increase in right ventricular wall thickness.  3. The mitral valve is abnormal. Mild thickening of the mitral valve  leaflet.  4. The tricuspid valve is grossly normal.  5. The aortic valve is tricuspid. No stenosis of the aortic valve.  6. There is mild dilatation of the ascending aorta measuring 40 mm.  7. The average left ventricular global longitudinal strain is -21.7 %.  Echocardiogram 07/10/2020 IMPRESSIONS    1. Left ventricular ejection fraction, by estimation, is 65 to 70%. The  left ventricle has normal function. The left ventricle has no regional  wall motion abnormalities. Left ventricular diastolic parameters are  consistent with Grade I diastolic  dysfunction (impaired relaxation).  2. Right ventricular systolic function is normal. The right ventricular   size is normal. Tricuspid regurgitation signal is inadequate for assessing  PA pressure.  3. The mitral valve is normal in structure. Trivial mitral valve  regurgitation. No evidence of mitral stenosis.  4. The aortic valve is tricuspid. There is mild calcification of the  aortic valve. Aortic valve regurgitation is not visualized. No aortic  stenosis is present.  5. The inferior vena cava is normal in size with greater than 50%  respiratory variability, suggesting right atrial pressure of 3 mmHg.   Comparison(s): No significant change from prior study.  Assessment & Plan   1.  Palpitations-reviewed previous EKG. She is planning to do a camera study with GI and small intestine overgrowth bacterial study.    Previously felt that increased gas was contributing to more awareness of her symptoms of palpitations.  Follow-up echocardiogram showed hyperdynamic EF, G1 DD, and trivial mitral valve regurgitation. Start metoprolol succinate 12.5 mg daily Heart healthy low-sodium diet-salty 6 given Increase physical activity as tolerated Avoid triggers caffeine, chocolate, EtOH, dehydration etc.  Shortness of breath- remains stable.  Maintaining physical activity.  Increase physical activity as tolerated  Fatigue-resolved. Continue walking and other physical activities.  CBC 03/24/2020 unremarkable. Heart healthy low-sodium diet-salty 6 given Increase physical activity as tolerated  Hypothyroidism-TSH 1.39 on 11/29/2018 Continue Synthroid Follows with PCP    Disposition: Follow-up with Dr. Stanford Bates in 12 months.   Jossie Ng. Darl Kuss NP-C    07/30/2020, 3:43 PM [place] [address] Office [phone] Fax [phone]  Notice: This dictation was prepared with Dragon dictation along with smaller phrase technology. Any transcriptional errors that result from this process are unintentional and may not be corrected upon review.  I spent 15  minutes examining this patient, reviewing medications, and using patient centered shared decision making involving her cardiac care.  Prior to  her visit I spent greater than 20 minutes reviewing her past medical history,  medications, and prior cardiac tests.

## 2020-07-28 DIAGNOSIS — I77811 Abdominal aortic ectasia: Secondary | ICD-10-CM | POA: Diagnosis not present

## 2020-07-30 ENCOUNTER — Ambulatory Visit (INDEPENDENT_AMBULATORY_CARE_PROVIDER_SITE_OTHER): Payer: PPO | Admitting: Gastroenterology

## 2020-07-30 ENCOUNTER — Encounter: Payer: Self-pay | Admitting: General Practice

## 2020-07-30 ENCOUNTER — Ambulatory Visit: Payer: PPO | Admitting: General Practice

## 2020-07-30 ENCOUNTER — Encounter: Payer: Self-pay | Admitting: Gastroenterology

## 2020-07-30 ENCOUNTER — Other Ambulatory Visit: Payer: Self-pay

## 2020-07-30 VITALS — BP 132/80 | HR 62 | Ht 62.0 in | Wt 104.1 lb

## 2020-07-30 VITALS — BP 138/80 | HR 80 | Ht 62.0 in | Wt 104.4 lb

## 2020-07-30 DIAGNOSIS — R002 Palpitations: Secondary | ICD-10-CM

## 2020-07-30 DIAGNOSIS — K6389 Other specified diseases of intestine: Secondary | ICD-10-CM

## 2020-07-30 DIAGNOSIS — R0602 Shortness of breath: Secondary | ICD-10-CM | POA: Diagnosis not present

## 2020-07-30 DIAGNOSIS — K581 Irritable bowel syndrome with constipation: Secondary | ICD-10-CM

## 2020-07-30 DIAGNOSIS — R109 Unspecified abdominal pain: Secondary | ICD-10-CM | POA: Diagnosis not present

## 2020-07-30 DIAGNOSIS — K449 Diaphragmatic hernia without obstruction or gangrene: Secondary | ICD-10-CM | POA: Diagnosis not present

## 2020-07-30 DIAGNOSIS — K219 Gastro-esophageal reflux disease without esophagitis: Secondary | ICD-10-CM

## 2020-07-30 DIAGNOSIS — Z860100 Personal history of colon polyps, unspecified: Secondary | ICD-10-CM

## 2020-07-30 DIAGNOSIS — K638219 Small intestinal bacterial overgrowth, unspecified: Secondary | ICD-10-CM

## 2020-07-30 DIAGNOSIS — E038 Other specified hypothyroidism: Secondary | ICD-10-CM | POA: Diagnosis not present

## 2020-07-30 DIAGNOSIS — R5383 Other fatigue: Secondary | ICD-10-CM

## 2020-07-30 DIAGNOSIS — R590 Localized enlarged lymph nodes: Secondary | ICD-10-CM | POA: Diagnosis not present

## 2020-07-30 MED ORDER — METOPROLOL SUCCINATE ER 25 MG PO TB24: 12.5000 mg | ORAL_TABLET | Freq: Every day | ORAL | 3 refills | Status: DC

## 2020-07-30 NOTE — Progress Notes (Signed)
IMPRESSION and PLAN:    #1. Epi pain/LUQ pain. Neg Mesenteric doppler 09/2019, Neg EGD, GES 2018, CT AP as below  #2. 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 IBS-C with bloating/LUQ pain. 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  Neg CT 07/2015, 03/2020 except for redundant stool filled colon, neg EGD with SB Bx 08/2018 and colon 07/2018 except for TAs. Neg celiac screen and Nl TSH.  #4. H/O tubular adenomas 07/2018. Next due 07/2023.  #5. GERD with small HH.   Plan:  -Miralax 17g po QD -GES to r/o gastroparesis. -SIBO breath test -Gas X prn TID -If still with problems, will try to get VCE (video capsule endoscopy) -If still with problems, get sitz marker study followed by trial of montegrity. -Has appt with Cardio. -FU prn -If above work-up is neg, may benefit from antianxiety meds.      HPI:    Chief Complaint:   Andrea Bates is a 75 y.o. female  Very anxious For follow-up visit.  Complains of LUQ/epi abdo pain with bloating and "palpitations".  She has appt with cardiology today.  Has been complaining of burping up food which she has eaten 7 to 8 hours before.  She had negative gastric emptying scan in 2018.  Also had negative EGD for gastric outlet obstruction as below.  Very much concerned about "vagus nerve"  Underwent CT scan Abdo/pelvis with p.o. and IV contrast 03/26/2020 which was neg.  On review she did have significant stool in the transverse colon and in splenic flexure.  Has constipation with pellet-like stools.  The abdominal pain is not there in the morning when she wakes up.  It is more prominent in the evening hours.  She has been tested positive for SIBO breath test in past.  She was initially treated with rifaximin with good results.  Subsequently did not have much benefit with Flagyl.  She was given another course of rifaximin which did not help much.  Minimal weight loss.  Wt Readings from Last 3 Encounters:  07/30/20 104 lb 2 oz (47.2 kg)   06/27/20 107 lb (48.5 kg)  06/26/20 107 lb (48.5 kg)       Prev GI WU:  Colon 07/2018 -Two 4 to 6 mm polyps in the ascending colon, removed with a cold snare. Resected and retrieved. -Moderate sigmoid diverticulosis. -Otherwise normal colonoscopy to TI. The colon was highly redundant.  EGD 07/2018 Small HH, gastric fundic gland polyp.  Neg SB Bx for celiac, neg gastric Bx for HP.    CT AP 03/26/2020 No CT findings of the abdomen or pelvis to explain abdominal pain. Aortic Atherosclerosis (ICD10-I70.0).  Solid-phase gastric emptying scan 07/2016: neg  UGI series 07/2015: neg  CT 03/2020 03/26/20 No CT findings of the abdomen or pelvis to explain abdominal pain. Aortic Atherosclerosis (ICD10-I70.0).  Wt Readings from Last 3 Encounters:  07/30/20 104 lb 2 oz (47.2 kg)  06/27/20 107 lb (48.5 kg)  06/26/20 107 lb (48.5 kg)    Past Medical History:  Diagnosis Date  . Chest pain    a. negative myoview 2011 [name]);  b. 04/2010 Cath: normal cors  . Colon polyps   . Diverticulosis   . GERD (gastroesophageal reflux disease)    a. mild esophagitis on egd 2012  . Hemorrhoids   . Hiatal hernia   . Hyperlipidemia   . Hyperlipidemia    Is now taking Fish oil. Had myalgias with Vytorin and Lipitor  . Hypothyroidism   . Irritable  bowel syndrome   . Palpitations    a. 04/2008 echo: EF 50-60%;  b. 21 day event monitor 2012 - occasional sinus tachycardia with rates 110-120's. Rare PAC's/PVC's. Serveral short 4-5 beat runs of a-tach. c. 06/2016: Event monitor showing PAC's, PVC's, and brief episodes of PAT.   Marland Kitchen Personal history of colonic polyps    a. colonoscopy 2009  . Personal history of malignant neoplasm of breast   . Pre-diabetes   . Syncope (VasoVagal Faint) 04/27/2010    Current Outpatient Medications  Medication Sig Dispense Refill  . Cholecalciferol (VITAMIN D) 125 MCG (5000 UT) CAPS Take 1 capsule by mouth daily.    . Coenzyme Q10 (COQ10) 100 MG CAPS Take 100 mg by mouth  daily. Reported on 07/18/2015    . Digestive Enzymes (DIGESTIVE ENZYME ULTRA PO) Take 1 tablet by mouth daily.    . fish oil-omega-3 fatty acids 1000 MG capsule Take 1 g by mouth daily. Reported on 07/18/2015    . Magnesium 300 MG CAPS Take 1 capsule by mouth 2 (two) times daily.    . Menaquinone-7 (VITAMIN K2 PO) Take 1 tablet by mouth daily.    . mometasone (NASONEX) 50 MCG/ACT nasal spray Place 2 sprays into the nose daily.    Marland Kitchen POTASSIUM CHLORIDE PO Take 90 mcg by mouth every other day.    . Simethicone 250 MG CAPS Take 1 capsule by mouth daily.    Marland Kitchen SYNTHROID 50 MCG tablet TAKE 1 TABLET DAILY ON AN EMPTY STOMACH WITH ONLY WATER FOR 30 MINUTES & NO ANTACID MEDS, CALCIUM OR MAGNESIUM FOR 4 HOURS & AVOID BIOTIN 90 tablet 1  . metoprolol succinate (TOPROL XL) 25 MG 24 hr tablet Take 25 mg every evening (Patient not taking: Reported on 07/30/2020) 90 tablet 3   No current facility-administered medications for this visit.    Past Surgical History:  Procedure Laterality Date  . BUNIONECTOMY    . CARPAL TUNNEL RELEASE     bilateral   . COLONOSCOPY  07/04/2013   Polyp, Polypectomy. Moderate diverticulosis in L colon. BX: Tubular Adenoma no dyspasia or malignancy. 5 yr rpt colon  . NASAL SINUS SURGERY    . TONSILLECTOMY    . UPPER GASTROINTESTINAL ENDOSCOPY  08/09/2016   normal EGD  . UPPER GASTROINTESTINAL ENDOSCOPY  07/04/2013   normal EGD    Family History  Problem Relation Age of Onset  . Colon cancer Other        Paternal Great Unlce   . Diabetes Paternal Grandmother   . Heart defect Father   . Breast cancer Paternal Aunt     Social History   Tobacco Use  . Smoking status: Never Smoker  . Smokeless tobacco: Never Used  Vaping Use  . Vaping Use: Never used  Substance Use Topics  . Alcohol use: No  . Drug use: No    Allergies  Allergen Reactions  . Iodine Palpitations  . Armour Thyroid [Thyroid]     Palpitations   . Atenolol     presyncope  . Celexa [Citalopram  Hydrobromide]     Severe chest tightness   . Diltiazem Hcl     Dropped bp  . Levaquin [Levofloxacin In D5w] Nausea And Vomiting  . Lipitor [Atorvastatin]     Myalgias   . Propranolol Hcl     Felt bad  . Shellfish Allergy   . Vytorin [Ezetimibe-Simvastatin]     Myalgias      Review of Systems: All systems reviewed and negative  except where noted in HPI.    Physical Exam:     BP 132/80 (BP Location: Left Arm, Patient Position: Sitting, Cuff Size: Normal)   Pulse 62   Ht 5\' 2"  (1.575 m)   Wt 104 lb 2 oz (47.2 kg)   BMI 19.04 kg/m  HEENT: No jaundice, no pallor Respiratory: Bilateral clear. CVS: S1-S2 normal no S3 or S4. Abdo: Soft nontender bowel sounds are present no definite hepatosplenomegaly.   CBC Latest Ref Rng & Units 03/24/2020 11/29/2018 05/09/2018  WBC 4.0 - 10.5 K/uL 7.7 7.0 7.2  Hemoglobin 12.0 - 15.0 g/dL 13.6 13.4 12.9  Hematocrit 36.0 - 46.0 % 40.2 39.2 38.3  Platelets 150.0 - 400.0 K/uL 359.0 350 356   CMP Latest Ref Rng & Units 03/24/2020 11/29/2018 05/09/2018  Glucose 70 - 99 mg/dL 88 89 86  BUN 6 - 23 mg/dL 19 16 17   Creatinine 0.40 - 1.20 mg/dL 0.75 0.74 0.74  Sodium 135 - 145 mEq/L 138 142 142  Potassium 3.5 - 5.1 mEq/L 4.7 4.2 4.5  Chloride 96 - 112 mEq/L 102 106 104  CO2 19 - 32 mEq/L 28 27 28   Calcium 8.4 - 10.5 mg/dL 9.9 9.8 10.1  Total Protein 6.0 - 8.3 g/dL 7.1 7.1 7.1  Total Bilirubin 0.2 - 1.2 mg/dL 0.3 0.4 0.3  Alkaline Phos 39 - 117 U/L 127(H) - -  AST 0 - 37 U/L 24 20 27   ALT 0 - 35 U/L 35 21 46(H)        Andrea Gangwer,MD 07/30/2020, 10:50 AM   CC Unk Pinto, MD

## 2020-07-30 NOTE — Patient Instructions (Addendum)
If you are age 74 or older, your body mass index should be between 23-30. Your Body mass index is 19.04 kg/m. If this is out of the aforementioned range listed, please consider follow up with your Primary Care Provider.  If you are age 43 or younger, your body mass index should be between 19-25. Your Body mass index is 19.04 kg/m. If this is out of the aformentioned range listed, please consider follow up with your Primary Care Provider.   Please purchase the following medications over the counter and take as directed: Miralax 17g po daily Gas-X 3 times daily as needed.  You have been given a testing kit to check for small intestine bacterial overgrowth (SIBO) which is completed by a company named [facility]. Make sure to return your test in the mail using the return mailing label given to you along with the kit. Your demographic and insurance information have already been sent to the company and they should be in contact with you over the next week regarding this test. [facility] will collect an upfront charge of $99.74 for commercial insurance plans and $209.74 is you are paying cash. Make sure to discuss with [facility] PRIOR to having the test if they have gotten informatoin from your insurance company as to how much your testing will cost out of pocket, if any. Please keep in mind that you will be getting a call from phone number [phone] or a similar number. If you do not hear from them within this time frame, please call our office at [phone].   You have been scheduled for a gastric emptying scan at [facility] on  08-14-2020 at 11am . Please arrive at least 30 minutes prior to your appointment for registration. Please make certain not to have anything to eat or drink after 6 hours  before your test. Hold all stomach medications (ex: Zofran, phenergan, Reglan) 48 hours prior to your test. If you need to reschedule your appointment, please contact radiology  scheduling at [phone]. _____________________________________________________________________ A gastric-emptying study measures how long it takes for food to move through your stomach. There are several ways to measure stomach emptying. In the most common test, you eat food that contains a small amount of radioactive material. A scanner that detects the movement of the radioactive material is placed over your abdomen to monitor the rate at which food leaves your stomach. This test normally takes about 4 hours to complete. _____________________________________________________________________   Thank you,  Dr. Jackquline Denmark

## 2020-07-30 NOTE — Patient Instructions (Addendum)
Medication Instructions:  TAKE METOPROLOL SUCCINATE 12.5MG  DAILY *If you need a refill on your cardiac medications before your next appointment, please call your pharmacy*  Lab Work:   Testing/Procedures:  NONE    NONE  Follow-Up: Your next appointment:  12 month(s) In Person with You may see Kirk Ruths, MD OR IF UNAVAILABLE JESSE CLEAVER, FNP-C or Sande Rives, PA-C  Please call our office 2 months in advance to schedule this appointment   At [facility], you and your health needs are our priority.  As part of our continuing mission to provide you with exceptional heart care, we have created designated Provider Care Teams.  These Care Teams include your primary Cardiologist (physician) and Advanced Practice Providers (APPs -  Physician Assistants and Nurse Practitioners) who all work together to provide you with the care you need, when you need it.            6 SALTY THINGS TO AVOID     1,800MG  DAILY

## 2020-08-01 DIAGNOSIS — I70223 Atherosclerosis of native arteries of extremities with rest pain, bilateral legs: Secondary | ICD-10-CM | POA: Diagnosis not present

## 2020-08-01 DIAGNOSIS — I739 Peripheral vascular disease, unspecified: Secondary | ICD-10-CM | POA: Diagnosis not present

## 2020-08-04 DIAGNOSIS — K6389 Other specified diseases of intestine: Secondary | ICD-10-CM | POA: Diagnosis not present

## 2020-08-05 ENCOUNTER — Other Ambulatory Visit (HOSPITAL_COMMUNITY): Payer: PPO

## 2020-08-08 ENCOUNTER — Other Ambulatory Visit (HOSPITAL_COMMUNITY): Payer: PPO

## 2020-08-14 ENCOUNTER — Other Ambulatory Visit: Payer: Self-pay

## 2020-08-14 ENCOUNTER — Encounter (HOSPITAL_COMMUNITY)
Admission: RE | Admit: 2020-08-14 | Discharge: 2020-08-14 | Disposition: A | Discharge status: Home / Self Care | Payer: PPO | Source: Ambulatory Visit | Attending: Gastroenterology | Admitting: Gastroenterology

## 2020-08-14 DIAGNOSIS — K449 Diaphragmatic hernia without obstruction or gangrene: Secondary | ICD-10-CM | POA: Insufficient documentation

## 2020-08-14 DIAGNOSIS — Z86010 Personal history of colonic polyps: Secondary | ICD-10-CM | POA: Insufficient documentation

## 2020-08-14 DIAGNOSIS — K219 Gastro-esophageal reflux disease without esophagitis: Secondary | ICD-10-CM | POA: Insufficient documentation

## 2020-08-14 DIAGNOSIS — R109 Unspecified abdominal pain: Secondary | ICD-10-CM | POA: Diagnosis not present

## 2020-08-14 DIAGNOSIS — K6389 Other specified diseases of intestine: Secondary | ICD-10-CM | POA: Insufficient documentation

## 2020-08-14 DIAGNOSIS — K581 Irritable bowel syndrome with constipation: Secondary | ICD-10-CM | POA: Diagnosis not present

## 2020-08-14 DIAGNOSIS — R1012 Left upper quadrant pain: Secondary | ICD-10-CM | POA: Diagnosis not present

## 2020-08-14 MED ORDER — TECHNETIUM TC 99M SULFUR COLLOID
2.0000 | Freq: Once | INTRAVENOUS | Status: AC | PRN
  Administered 2020-08-14: 2 via INTRAVENOUS

## 2020-08-16 ENCOUNTER — Telehealth: Payer: Self-pay | Admitting: Gastroenterology

## 2020-08-16 DIAGNOSIS — K6389 Other specified diseases of intestine: Secondary | ICD-10-CM

## 2020-08-16 NOTE — Telephone Encounter (Signed)
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 -Report sent for scanning.   Plan: -Proceed with rifaximin 550 mg p.o. TID x 2 weeks. Can we get pt assistance or samples from the drug rep ( I believe it is [place]) -Await results of gastric emptying scan. -Please send copy to Irven Shelling NP   [place]

## 2020-08-19 MED ORDER — RIFAXIMIN 550 MG PO TABS: 550.0000 mg | ORAL_TABLET | Freq: Three times a day (TID) | ORAL | 0 refills | Status: DC

## 2020-08-19 NOTE — Telephone Encounter (Addendum)
Patient is concerned regarding her SIBO results. She said that this is the 4th time in 1.5 years that she has had this and wonder if the bacteria overgrowth can cause damage to her body and wants to know regarding the xifaxan as well if it affect her liver etc? Patient wants to know if there really is a way to get rid of this? She says that she spends over 650 dollars after insurance for the medication.  I have reached out to [place] and [place] regarding samples of xifaxan but no answer yet.

## 2020-08-19 NOTE — Addendum Note (Signed)
Addended by: Curlene Labrum E on: 08/19/2020 10:15 AM   Modules accepted: Orders

## 2020-08-20 NOTE — Telephone Encounter (Signed)
Patient will come to the [facility] office to pick up the medication by the end of the week

## 2020-08-20 NOTE — Telephone Encounter (Signed)
Thanks for reaching out and trying to arrange for rifaximin samples. Hopefully she will feel better. It will "not damage her intestines".

## 2020-08-28 DIAGNOSIS — E038 Other specified hypothyroidism: Secondary | ICD-10-CM | POA: Diagnosis not present

## 2020-08-28 DIAGNOSIS — E782 Mixed hyperlipidemia: Secondary | ICD-10-CM | POA: Diagnosis not present

## 2020-08-28 DIAGNOSIS — D518 Other vitamin B12 deficiency anemias: Secondary | ICD-10-CM | POA: Diagnosis not present

## 2020-08-28 DIAGNOSIS — I10 Essential (primary) hypertension: Secondary | ICD-10-CM | POA: Diagnosis not present

## 2020-08-28 DIAGNOSIS — E559 Vitamin D deficiency, unspecified: Secondary | ICD-10-CM | POA: Diagnosis not present

## 2020-08-28 DIAGNOSIS — E119 Type 2 diabetes mellitus without complications: Secondary | ICD-10-CM | POA: Diagnosis not present

## 2020-10-16 DIAGNOSIS — E559 Vitamin D deficiency, unspecified: Secondary | ICD-10-CM | POA: Diagnosis not present

## 2020-10-16 DIAGNOSIS — E782 Mixed hyperlipidemia: Secondary | ICD-10-CM | POA: Diagnosis not present

## 2020-10-16 DIAGNOSIS — E038 Other specified hypothyroidism: Secondary | ICD-10-CM | POA: Diagnosis not present

## 2020-10-16 DIAGNOSIS — D518 Other vitamin B12 deficiency anemias: Secondary | ICD-10-CM | POA: Diagnosis not present

## 2020-10-16 DIAGNOSIS — E119 Type 2 diabetes mellitus without complications: Secondary | ICD-10-CM | POA: Diagnosis not present

## 2020-10-16 DIAGNOSIS — I10 Essential (primary) hypertension: Secondary | ICD-10-CM | POA: Diagnosis not present

## 2020-11-04 DIAGNOSIS — E119 Type 2 diabetes mellitus without complications: Secondary | ICD-10-CM | POA: Diagnosis not present

## 2020-11-04 DIAGNOSIS — I10 Essential (primary) hypertension: Secondary | ICD-10-CM | POA: Diagnosis not present

## 2020-11-04 DIAGNOSIS — E038 Other specified hypothyroidism: Secondary | ICD-10-CM | POA: Diagnosis not present

## 2020-11-04 DIAGNOSIS — E782 Mixed hyperlipidemia: Secondary | ICD-10-CM | POA: Diagnosis not present

## 2020-11-04 DIAGNOSIS — E559 Vitamin D deficiency, unspecified: Secondary | ICD-10-CM | POA: Diagnosis not present

## 2020-11-04 DIAGNOSIS — D518 Other vitamin B12 deficiency anemias: Secondary | ICD-10-CM | POA: Diagnosis not present

## 2020-12-08 ENCOUNTER — Ambulatory Visit: Payer: PPO | Admitting: Adult Health

## 2020-12-15 DIAGNOSIS — E119 Type 2 diabetes mellitus without complications: Secondary | ICD-10-CM | POA: Diagnosis not present

## 2020-12-15 DIAGNOSIS — E559 Vitamin D deficiency, unspecified: Secondary | ICD-10-CM | POA: Diagnosis not present

## 2020-12-15 DIAGNOSIS — I10 Essential (primary) hypertension: Secondary | ICD-10-CM | POA: Diagnosis not present

## 2020-12-15 DIAGNOSIS — E038 Other specified hypothyroidism: Secondary | ICD-10-CM | POA: Diagnosis not present

## 2020-12-15 DIAGNOSIS — D518 Other vitamin B12 deficiency anemias: Secondary | ICD-10-CM | POA: Diagnosis not present

## 2020-12-15 DIAGNOSIS — E782 Mixed hyperlipidemia: Secondary | ICD-10-CM | POA: Diagnosis not present

## 2021-01-15 DIAGNOSIS — J3489 Other specified disorders of nose and nasal sinuses: Secondary | ICD-10-CM | POA: Diagnosis not present

## 2021-01-19 DIAGNOSIS — D518 Other vitamin B12 deficiency anemias: Secondary | ICD-10-CM | POA: Diagnosis not present

## 2021-01-19 DIAGNOSIS — E038 Other specified hypothyroidism: Secondary | ICD-10-CM | POA: Diagnosis not present

## 2021-01-19 DIAGNOSIS — I10 Essential (primary) hypertension: Secondary | ICD-10-CM | POA: Diagnosis not present

## 2021-01-19 DIAGNOSIS — E782 Mixed hyperlipidemia: Secondary | ICD-10-CM | POA: Diagnosis not present

## 2021-01-19 DIAGNOSIS — E119 Type 2 diabetes mellitus without complications: Secondary | ICD-10-CM | POA: Diagnosis not present

## 2021-01-19 DIAGNOSIS — E559 Vitamin D deficiency, unspecified: Secondary | ICD-10-CM | POA: Diagnosis not present

## 2021-02-04 DIAGNOSIS — I10 Essential (primary) hypertension: Secondary | ICD-10-CM | POA: Diagnosis not present

## 2021-02-04 DIAGNOSIS — E119 Type 2 diabetes mellitus without complications: Secondary | ICD-10-CM | POA: Diagnosis not present

## 2021-02-04 DIAGNOSIS — D518 Other vitamin B12 deficiency anemias: Secondary | ICD-10-CM | POA: Diagnosis not present

## 2021-02-04 DIAGNOSIS — E782 Mixed hyperlipidemia: Secondary | ICD-10-CM | POA: Diagnosis not present

## 2021-02-04 DIAGNOSIS — Z0000 Encounter for general adult medical examination without abnormal findings: Secondary | ICD-10-CM | POA: Diagnosis not present

## 2021-02-04 DIAGNOSIS — E038 Other specified hypothyroidism: Secondary | ICD-10-CM | POA: Diagnosis not present

## 2021-02-04 DIAGNOSIS — E559 Vitamin D deficiency, unspecified: Secondary | ICD-10-CM | POA: Diagnosis not present

## 2021-02-06 DIAGNOSIS — E038 Other specified hypothyroidism: Secondary | ICD-10-CM | POA: Diagnosis not present

## 2021-02-18 DIAGNOSIS — E782 Mixed hyperlipidemia: Secondary | ICD-10-CM | POA: Diagnosis not present

## 2021-02-18 DIAGNOSIS — E119 Type 2 diabetes mellitus without complications: Secondary | ICD-10-CM | POA: Diagnosis not present

## 2021-02-18 DIAGNOSIS — E038 Other specified hypothyroidism: Secondary | ICD-10-CM | POA: Diagnosis not present

## 2021-02-18 DIAGNOSIS — I10 Essential (primary) hypertension: Secondary | ICD-10-CM | POA: Diagnosis not present

## 2021-02-18 DIAGNOSIS — D518 Other vitamin B12 deficiency anemias: Secondary | ICD-10-CM | POA: Diagnosis not present

## 2021-02-18 DIAGNOSIS — E559 Vitamin D deficiency, unspecified: Secondary | ICD-10-CM | POA: Diagnosis not present

## 2021-03-10 DIAGNOSIS — E782 Mixed hyperlipidemia: Secondary | ICD-10-CM | POA: Diagnosis not present

## 2021-03-10 DIAGNOSIS — E559 Vitamin D deficiency, unspecified: Secondary | ICD-10-CM | POA: Diagnosis not present

## 2021-03-10 DIAGNOSIS — D518 Other vitamin B12 deficiency anemias: Secondary | ICD-10-CM | POA: Diagnosis not present

## 2021-03-10 DIAGNOSIS — E038 Other specified hypothyroidism: Secondary | ICD-10-CM | POA: Diagnosis not present

## 2021-03-10 DIAGNOSIS — E119 Type 2 diabetes mellitus without complications: Secondary | ICD-10-CM | POA: Diagnosis not present

## 2021-03-10 DIAGNOSIS — I10 Essential (primary) hypertension: Secondary | ICD-10-CM | POA: Diagnosis not present

## 2021-04-09 ENCOUNTER — Telehealth: Payer: Self-pay | Admitting: Gastroenterology

## 2021-04-09 NOTE — Telephone Encounter (Signed)
Patient requested refill on Xifaxan.

## 2021-04-09 NOTE — Telephone Encounter (Signed)
Pt stated that she is having cramping bloating and indigestion. Its the same issues that she has been having and would like xifaxan again. She has an appointment on 2-6 and was told to keep that appointment. Please advise

## 2021-04-15 NOTE — Telephone Encounter (Signed)
Please arrange for SIBO breath test Then lets wait for the appointment Please keep samples of rifaximin for her-was very expensive last time RG

## 2021-04-15 NOTE — Telephone Encounter (Signed)
Please advise Dr Lyndel Safe

## 2021-04-15 NOTE — Telephone Encounter (Signed)
Patient followed up on medication request. Best contact number [phone]

## 2021-04-16 NOTE — Telephone Encounter (Signed)
Patient said when she did her breath test last year she had to pay over 500 because her insurance isn't paying for it. Please advise. She doesn't want to do it.

## 2021-04-17 NOTE — Telephone Encounter (Signed)
LVM on home number. Cell wouldn't allow me to

## 2021-04-17 NOTE — Telephone Encounter (Signed)
Patient made aware to keep appointment since she opt not to do the sibo test again

## 2021-04-17 NOTE — Telephone Encounter (Signed)
She has appointment coming up We will discuss more-May need to treat empirically RG

## 2021-04-24 ENCOUNTER — Ambulatory Visit: Payer: PPO | Admitting: Gastroenterology

## 2021-04-27 ENCOUNTER — Ambulatory Visit (INDEPENDENT_AMBULATORY_CARE_PROVIDER_SITE_OTHER): Payer: PPO | Admitting: Gastroenterology

## 2021-04-27 ENCOUNTER — Other Ambulatory Visit: Payer: Self-pay

## 2021-04-27 VITALS — BP 130/88 | HR 85 | Ht 62.0 in | Wt 108.1 lb

## 2021-04-27 DIAGNOSIS — R1013 Epigastric pain: Secondary | ICD-10-CM

## 2021-04-27 DIAGNOSIS — K6389 Other specified diseases of intestine: Secondary | ICD-10-CM

## 2021-04-27 DIAGNOSIS — K581 Irritable bowel syndrome with constipation: Secondary | ICD-10-CM

## 2021-04-27 DIAGNOSIS — K449 Diaphragmatic hernia without obstruction or gangrene: Secondary | ICD-10-CM

## 2021-04-27 DIAGNOSIS — K219 Gastro-esophageal reflux disease without esophagitis: Secondary | ICD-10-CM

## 2021-04-27 MED ORDER — AMITRIPTYLINE HCL 25 MG PO TABS: 25.0000 mg | ORAL_TABLET | Freq: Every day | ORAL | 6 refills | Status: DC

## 2021-04-27 NOTE — Patient Instructions (Addendum)
If you are age 75 or older, your body mass index should be between 23-30. Your Body mass index is 19.78 kg/m. If this is out of the aforementioned range listed, please consider follow up with your Primary Care Provider.  If you are age 42 or younger, your body mass index should be between 19-25. Your Body mass index is 19.78 kg/m. If this is out of the aformentioned range listed, please consider follow up with your Primary Care Provider.   __________________________________________________________  The  GI providers would like to encourage you to use [facility] to communicate with providers for non-urgent requests or questions.  Due to long hold times on the telephone, sending your provider a message by [facility] may be a faster and more efficient way to get a response.  Please allow 48 business hours for a response.  Please remember that this is for non-urgent requests.    Please purchase the following medications over the counter and take as directed:  Take Miralax 1 capful mixed in 8 ounces of water at bed time for constipation as tolerated.   Gas X three times daily as needed  We have given you samples of Xifaxin 550 mg, 3 tablets daily for 8 days  If still having problems we will do a Ct of the abdomen and pelvis and a trial of motegrity.  Follow up as needed.   It was a pleasure to see you today!  Jackquline Denmark, M.D.

## 2021-04-27 NOTE — Progress Notes (Signed)
IMPRESSION and PLAN:    #1. Epi pain/LUQ pain. Neg Mesenteric doppler 09/2019, Neg EGD, GES 2018, CT AP as below. Nl GES 07/2020  #2. 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 IBS-C with bloating/LUQ pain. 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  Neg CT 07/2015, 03/2020 except for redundant stool filled colon, neg EGD with SB Bx 08/2018 and colon 07/2018 except for TAs. Neg celiac screen and Nl TSH.  #4. H/O tubular adenomas 07/2018. Next due 07/2023.  #5. GERD with small HH.   Plan:  -Amitryptline 25 mg QHS #30 6RF. Start 1/2hr -Rifaxamin 550mg  po TID x 7 days (Samples) -Gas X TID with each meal -Call in 6 weeks. -If still with problems, will proceed with CT AP with contrast. -If still with problems, trial of montegrity. -FU prn -If above work-up is neg, may benefit from antianxiety meds.      HPI:    Chief Complaint:   Andrea Bates is a 76 y.o. female  Very anxious For follow-up visit.  Complains of LUQ/epi abdo pain with bloating and "palpitations".  She has appt with cardiology today.  Has been complaining of burping up food which she has eaten 7 to 8 hours before.  She had negative gastric emptying scan in 2018.  Also had negative EGD for gastric outlet obstruction as below.  Very much concerned about "vagus nerve"  Underwent CT scan Abdo/pelvis with p.o. and IV contrast 03/26/2020 which was neg.  On review she did have significant stool in the transverse colon and in splenic flexure.  Has constipation with pellet-like stools.  The abdominal pain is not there in the morning when she wakes up.  It is more prominent in the evening hours.  She has been tested positive for SIBO breath test in past.  She was initially treated with rifaximin with good results.  Subsequently did not have much benefit with Flagyl.  She was given another course of rifaximin which did not help much.  Minimal weight loss.  Wt Readings from Last 3 Encounters:  04/27/21 108 lb 2 oz (49 kg)   07/30/20 104 lb 6.4 oz (47.4 kg)  07/30/20 104 lb 2 oz (47.2 kg)       Prev GI WU:  Colon 07/2018 -Two 4 to 6 mm polyps in the ascending colon, removed with a cold snare. Resected and retrieved. -Moderate sigmoid diverticulosis. -Otherwise normal colonoscopy to TI. The colon was highly redundant.  EGD 07/2018 Small HH, gastric fundic gland polyp.  Neg SB Bx for celiac, neg gastric Bx for HP.    CT AP 03/26/2020 No CT findings of the abdomen or pelvis to explain abdominal pain. Aortic Atherosclerosis (ICD10-I70.0).  Solid-phase gastric emptying scan 07/2016: neg  UGI series 07/2015: neg  CT 03/2020 03/26/20 No CT findings of the abdomen or pelvis to explain abdominal pain. Aortic Atherosclerosis (ICD10-I70.0).  Wt Readings from Last 3 Encounters:  04/27/21 108 lb 2 oz (49 kg)  07/30/20 104 lb 6.4 oz (47.4 kg)  07/30/20 104 lb 2 oz (47.2 kg)    Past Medical History:  Diagnosis Date   Chest pain    a. negative myoview 2011 [name]);  b. 04/2010 Cath: normal cors   Colon polyps    Diverticulosis    GERD (gastroesophageal reflux disease)    a. mild esophagitis on egd 2012   Hemorrhoids    Hiatal hernia    Hyperlipidemia    Hyperlipidemia    Is now taking Fish oil. Had myalgias  with Vytorin and Lipitor   Hypothyroidism    Irritable bowel syndrome    Palpitations    a. 04/2008 echo: EF 50-60%;  b. 21 day event monitor 2012 - occasional sinus tachycardia with rates 110-120's. Rare PAC's/PVC's. Serveral short 4-5 beat runs of a-tach. c. 06/2016: Event monitor showing PAC's, PVC's, and brief episodes of PAT.    Personal history of colonic polyps    a. colonoscopy 2009   Personal history of malignant neoplasm of breast    Pre-diabetes    Syncope (VasoVagal Faint) 04/27/2010    Current Outpatient Medications  Medication Sig Dispense Refill   Cholecalciferol (VITAMIN D) 125 MCG (5000 UT) CAPS Take 1 capsule by mouth daily.     Coenzyme Q10 (COQ10) 100 MG CAPS Take 100 mg by  mouth daily. Reported on 07/18/2015     Digestive Enzymes (DIGESTIVE ENZYME ULTRA PO) Take 1 tablet by mouth daily.     fish oil-omega-3 fatty acids 1000 MG capsule Take 1 g by mouth daily. Reported on 07/18/2015     Magnesium 300 MG CAPS Take 1 capsule by mouth 2 (two) times daily.     Menaquinone-7 (VITAMIN K2 PO) Take 1 tablet by mouth daily.     metoprolol succinate (TOPROL XL) 25 MG 24 hr tablet Take 0.5 tablets (12.5 mg total) by mouth daily. 90 tablet 3   mometasone (NASONEX) 50 MCG/ACT nasal spray Place 2 sprays into the nose daily.     POTASSIUM CHLORIDE PO Take 90 mcg by mouth every other day.     Simethicone 250 MG CAPS Take 1 capsule by mouth daily.     SYNTHROID 50 MCG tablet TAKE 1 TABLET DAILY ON AN EMPTY STOMACH WITH ONLY WATER FOR 30 MINUTES & NO ANTACID MEDS, CALCIUM OR MAGNESIUM FOR 4 HOURS & AVOID BIOTIN 90 tablet 1   No current facility-administered medications for this visit.    Past Surgical History:  Procedure Laterality Date   BUNIONECTOMY     CARPAL TUNNEL RELEASE     bilateral    COLONOSCOPY  07/04/2013   Polyp, Polypectomy. Moderate diverticulosis in L colon. BX: Tubular Adenoma no dyspasia or malignancy. 5 yr rpt colon   NASAL SINUS SURGERY     TONSILLECTOMY     UPPER GASTROINTESTINAL ENDOSCOPY  08/09/2016   normal EGD   UPPER GASTROINTESTINAL ENDOSCOPY  07/04/2013   normal EGD    Family History  Problem Relation Age of Onset   Colon cancer Other        Paternal Great Unlce    Diabetes Paternal Grandmother    Heart defect Father    Breast cancer Paternal Aunt     Social History   Tobacco Use   Smoking status: Never   Smokeless tobacco: Never  Vaping Use   Vaping Use: Never used  Substance Use Topics   Alcohol use: No   Drug use: No    Allergies  Allergen Reactions   Iodine Palpitations   [place] Thyroid [Thyroid]     Palpitations    Atenolol     presyncope   Celexa [Citalopram Hydrobromide]     Severe chest tightness    Diltiazem  Hcl     Dropped bp   Levaquin [Levofloxacin In D5w] Nausea And Vomiting   Lipitor [Atorvastatin]     Myalgias    Propranolol Hcl     Felt bad   Shellfish Allergy    Vytorin [Ezetimibe-Simvastatin]     Myalgias      Review  of Systems: All systems reviewed and negative except where noted in HPI.    Physical Exam:     BP 130/88    Pulse 85    Ht 5\' 2"  (1.575 m)    Wt 108 lb 2 oz (49 kg)    SpO2 95%    BMI 19.78 kg/m  HEENT: No jaundice, no pallor Respiratory: Bilateral clear. CVS: S1-S2 normal no S3 or S4. Abdo: Soft nontender bowel sounds are present no definite hepatosplenomegaly.   CBC Latest Ref Rng & Units 03/24/2020 11/29/2018 05/09/2018  WBC 4.0 - 10.5 K/uL 7.7 7.0 7.2  Hemoglobin 12.0 - 15.0 g/dL 13.6 13.4 12.9  Hematocrit 36.0 - 46.0 % 40.2 39.2 38.3  Platelets 150.0 - 400.0 K/uL 359.0 350 356   CMP Latest Ref Rng & Units 03/24/2020 11/29/2018 05/09/2018  Glucose 70 - 99 mg/dL 88 89 86  BUN 6 - 23 mg/dL 19 16 17   Creatinine 0.40 - 1.20 mg/dL 0.75 0.74 0.74  Sodium 135 - 145 mEq/L 138 142 142  Potassium 3.5 - 5.1 mEq/L 4.7 4.2 4.5  Chloride 96 - 112 mEq/L 102 106 104  CO2 19 - 32 mEq/L 28 27 28   Calcium 8.4 - 10.5 mg/dL 9.9 9.8 10.1  Total Protein 6.0 - 8.3 g/dL 7.1 7.1 7.1  Total Bilirubin 0.2 - 1.2 mg/dL 0.3 0.4 0.3  Alkaline Phos 39 - 117 U/L 127(H) - -  AST 0 - 37 U/L 24 20 27   ALT 0 - 35 U/L 35 21 46(H)        Kierre Hintz,MD 04/27/2021, 1:57 PM   CC Zoila Shutter, NP

## 2021-05-04 ENCOUNTER — Other Ambulatory Visit: Payer: Self-pay

## 2021-05-04 ENCOUNTER — Telehealth: Payer: Self-pay | Admitting: Gastroenterology

## 2021-05-04 DIAGNOSIS — R1013 Epigastric pain: Secondary | ICD-10-CM

## 2021-05-04 DIAGNOSIS — R1012 Left upper quadrant pain: Secondary | ICD-10-CM

## 2021-05-04 DIAGNOSIS — K581 Irritable bowel syndrome with constipation: Secondary | ICD-10-CM

## 2021-05-04 NOTE — Telephone Encounter (Signed)
Pt scheduled for Ct abd/Pelvis with Contrast at [facility] on 05/15/2021 at 8:30   Pt to arrive at 8:00: No thing to eat or Drink 4 hours Prior to CT scan. Pt to pick up oral contrast prior to date of CT scan at [facility] . Pt made aware  Pt needed BUN/ Creatine prior to Ct Scan. Order for CMET placed in Epic; pt made aware. Pt given directions to Lab. Pt verbalized understanding with all questions answered.

## 2021-05-04 NOTE — Telephone Encounter (Signed)
Patient called and said when she saw you last week she wasn't ready to proceed with the CT scan, but after thinking about it, she decided she is ready.  Please call patient and advise.  Thank you.

## 2021-05-06 ENCOUNTER — Other Ambulatory Visit (INDEPENDENT_AMBULATORY_CARE_PROVIDER_SITE_OTHER): Payer: PPO

## 2021-05-06 DIAGNOSIS — R1013 Epigastric pain: Secondary | ICD-10-CM

## 2021-05-06 DIAGNOSIS — R1012 Left upper quadrant pain: Secondary | ICD-10-CM | POA: Diagnosis not present

## 2021-05-07 LAB — COMPREHENSIVE METABOLIC PANEL
ALT: 55 U/L — ABNORMAL HIGH (ref 0–35)
AST: 28 U/L (ref 0–37)
Albumin: 4.3 g/dL (ref 3.5–5.2)
Alkaline Phosphatase: 123 U/L — ABNORMAL HIGH (ref 39–117)
BUN: 19 mg/dL (ref 6–23)
CO2: 31 mEq/L (ref 19–32)
Calcium: 9.8 mg/dL (ref 8.4–10.5)
Chloride: 103 mEq/L (ref 96–112)
Creatinine, Ser: 0.78 mg/dL (ref 0.40–1.20)
GFR: 74.94 mL/min (ref >60.00)
Glucose, Bld: 87 mg/dL (ref 70–99)
Potassium: 4.4 mEq/L (ref 3.5–5.1)
Sodium: 138 mEq/L (ref 135–145)
Total Bilirubin: 0.4 mg/dL (ref 0.2–1.2)
Total Protein: 7.1 g/dL (ref 6.0–8.3)

## 2021-05-11 ENCOUNTER — Telehealth: Payer: Self-pay

## 2021-05-11 ENCOUNTER — Other Ambulatory Visit: Payer: Self-pay

## 2021-05-11 DIAGNOSIS — R1012 Left upper quadrant pain: Secondary | ICD-10-CM

## 2021-05-11 DIAGNOSIS — Z91041 Radiographic dye allergy status: Secondary | ICD-10-CM

## 2021-05-11 DIAGNOSIS — R7401 Elevation of levels of liver transaminase levels: Secondary | ICD-10-CM

## 2021-05-11 DIAGNOSIS — R1013 Epigastric pain: Secondary | ICD-10-CM

## 2021-05-11 MED ORDER — DIPHENHYDRAMINE HCL 25 MG PO CAPS: 25.0000 mg | ORAL_CAPSULE | Freq: Once | ORAL | 0 refills | Status: DC

## 2021-05-11 MED ORDER — PREDNISONE 50 MG PO TABS: ORAL_TABLET | ORAL | 0 refills | Status: DC

## 2021-05-11 NOTE — Telephone Encounter (Signed)
Patient returning nurses call

## 2021-05-11 NOTE — Telephone Encounter (Signed)
Pt previously scheduled for CT on 05/12/2021 at 3:30: Pt does have an Allergy to contrast: Orders received from Dr Lyndel Safe : Julienne Kass prednisone 50 mg p.o. 13, 7 and 1 hour before the procedure and Benadryl 25 mg p.o. 1 hour before the procedure.  Prescription sent to pharmacy:  Pt made aware Pt verbalized understanding with all questions answered.

## 2021-05-11 NOTE — Telephone Encounter (Signed)
Pt previously scheduled for CT on 05/12/2021 at 3:30: Pt does have an Allergy to contrast: Orders received from Dr Lyndel Safe : Andrea Bates prednisone 50 mg p.o. 13, 7 and 1 hour before the procedure and Benadryl 25 mg p.o. 1 hour before the procedure.  Prescription sent to pharmacy:  Left message for pt to call back

## 2021-05-12 ENCOUNTER — Ambulatory Visit (HOSPITAL_COMMUNITY)
Admission: RE | Admit: 2021-05-12 | Discharge: 2021-05-12 | Disposition: A | Discharge status: Home / Self Care | Payer: PPO | Source: Ambulatory Visit | Attending: Gastroenterology | Admitting: Gastroenterology

## 2021-05-12 ENCOUNTER — Other Ambulatory Visit: Payer: Self-pay

## 2021-05-12 ENCOUNTER — Ambulatory Visit (HOSPITAL_COMMUNITY): Payer: PPO

## 2021-05-12 DIAGNOSIS — R1013 Epigastric pain: Secondary | ICD-10-CM | POA: Diagnosis not present

## 2021-05-12 DIAGNOSIS — R1012 Left upper quadrant pain: Secondary | ICD-10-CM

## 2021-05-12 DIAGNOSIS — K581 Irritable bowel syndrome with constipation: Secondary | ICD-10-CM

## 2021-05-12 MED ORDER — IOHEXOL 300 MG/ML  SOLN
80.0000 mL | Freq: Once | INTRAMUSCULAR | Status: AC | PRN
  Administered 2021-05-12: 80 mL via INTRAVENOUS

## 2021-05-12 MED ORDER — SODIUM CHLORIDE (PF) 0.9 % IJ SOLN
INTRAMUSCULAR | Status: AC
  Filled 2021-05-12: qty 50

## 2021-05-14 ENCOUNTER — Telehealth: Payer: Self-pay | Admitting: Gastroenterology

## 2021-05-14 NOTE — Telephone Encounter (Signed)
Pt was notified that she just had CT scan done 2 days ago and it takes the Dr's several days to review scan's and labs. Pt was notified as soon as Dr. Lyndel Safe reviews CT scan  and advises off of the results then we will reach out to her.  Pt verbalized understanding with all questions answered.

## 2021-05-14 NOTE — Telephone Encounter (Signed)
Inbound call from pt requesting a call back stating that she would like to know the results of her CT SCAN. Please advise. Thank you.

## 2021-05-15 ENCOUNTER — Ambulatory Visit (HOSPITAL_COMMUNITY): Payer: PPO

## 2021-05-20 ENCOUNTER — Encounter (HOSPITAL_BASED_OUTPATIENT_CLINIC_OR_DEPARTMENT_OTHER): Payer: Self-pay | Admitting: General Practice

## 2021-05-20 ENCOUNTER — Ambulatory Visit (INDEPENDENT_AMBULATORY_CARE_PROVIDER_SITE_OTHER): Payer: PPO | Admitting: General Practice

## 2021-05-20 ENCOUNTER — Other Ambulatory Visit: Payer: Self-pay

## 2021-05-20 ENCOUNTER — Other Ambulatory Visit: Payer: Self-pay | Admitting: Gastroenterology

## 2021-05-20 VITALS — BP 150/74 | HR 73 | Ht 62.0 in | Wt 105.3 lb

## 2021-05-20 DIAGNOSIS — E038 Other specified hypothyroidism: Secondary | ICD-10-CM | POA: Diagnosis not present

## 2021-05-20 DIAGNOSIS — R002 Palpitations: Secondary | ICD-10-CM | POA: Diagnosis not present

## 2021-05-20 DIAGNOSIS — R0602 Shortness of breath: Secondary | ICD-10-CM

## 2021-05-20 DIAGNOSIS — R5383 Other fatigue: Secondary | ICD-10-CM | POA: Diagnosis not present

## 2021-05-20 MED ORDER — METOPROLOL SUCCINATE ER 25 MG PO TB24: 12.5000 mg | ORAL_TABLET | Freq: Every day | ORAL | 3 refills | Status: DC

## 2021-05-20 NOTE — Patient Instructions (Addendum)
Medication Instructions:  ?Your physician recommends that you continue on your current medications as directed. Please refer to the Current Medication list given to you today.  ? ?1.We refilled the metoprolol succinate (Toprol XL) 25 mg, take 1/2 tablet (12.5 mg) by mouth daily  ?2.Continue Gas X  ? ?*If you need a refill on your cardiac medications before your next appointment, please call your pharmacy* ? ? ?Lab Work: ?None ?If you have labs (blood work) drawn today and your tests are completely normal, you will receive your results only by: ?MyChart Message (if you have MyChart) OR ?A paper copy in the mail ?If you have any lab test that is abnormal or we need to change your treatment, we will call you to review the results. ? ? ?Follow-Up: ?At [facility], you and your health needs are our priority.  As part of our continuing mission to provide you with exceptional heart care, we have created designated Provider Care Teams.  These Care Teams include your primary Cardiologist (physician) and Advanced Practice Providers (APPs -  Physician Assistants and Nurse Practitioners) who all work together to provide you with the care you need, when you need it. ? ? ?Your next appointment:   ?6-9 month(s) ? ?The format for your next appointment:   ?In Person ? ?Provider:   ?Kirk Ruths, MD {  ? ? ?Other Instructions ? ?High-Fiber Eating Plan ?Fiber, also called dietary fiber, is a type of carbohydrate. It is found foods such as fruits, vegetables, whole grains, and beans. A high-fiber diet can have many health benefits. Your health care provider may recommend a high-fiber diet to help: ?Prevent constipation. Fiber can make your bowel movements more regular. ?Lower your cholesterol. ?Relieve the following conditions: ?Inflammation of veins in the anus (hemorrhoids). ?Inflammation of specific areas of the digestive tract (uncomplicated diverticulosis). ?A problem of the large intestine, also called the colon, that  sometimes causes pain and diarrhea (irritable bowel syndrome, or IBS). ?Prevent overeating as part of a weight-loss plan. ?Prevent heart disease, type 2 diabetes, and certain cancers. ?What are tips for following this plan? ?Reading food labels ? ?Check the nutrition facts label on food products for the amount of dietary fiber. Choose foods that have 5 grams of fiber or more per serving. ?The goals for recommended daily fiber intake include: ?Men (age 26 or younger): 34-38 g. ?Men (over age 22): 28-34 g. ?Women (age 65 or younger): 25-28 g. ?Women (over age 34): 22-25 g. ?Your daily fiber goal is _____________ g. ?Shopping ?Choose whole fruits and vegetables instead of processed forms, such as apple juice or applesauce. ?Choose a wide variety of high-fiber foods such as avocados, lentils, oats, and kidney beans. ?Read the nutrition facts label of the foods you choose. Be aware of foods with added fiber. These foods often have high sugar and sodium amounts per serving. ?Cooking ?Use whole-grain flour for baking and cooking. ?Cook with brown rice instead of white rice. ?Meal planning ?Start the day with a breakfast that is high in fiber, such as a cereal that contains 5 g of fiber or more per serving. ?Eat breads and cereals that are made with whole-grain flour instead of refined flour or white flour. ?Eat brown rice, bulgur wheat, or millet instead of white rice. ?Use beans in place of meat in soups, salads, and pasta dishes. ?Be sure that half of the grains you eat each day are whole grains. ?General information ?You can get the recommended daily intake of dietary fiber  by: ?Eating a variety of fruits, vegetables, grains, nuts, and beans. ?Taking a fiber supplement if you are not able to take in enough fiber in your diet. It is better to get fiber through food than from a supplement. ?Gradually increase how much fiber you consume. If you increase your intake of dietary fiber too quickly, you may have bloating,  cramping, or gas. ?Drink plenty of water to help you digest fiber. ?Choose high-fiber snacks, such as berries, raw vegetables, nuts, and popcorn. ?What foods should I eat? ?Fruits ?Berries. Pears. Apples. Oranges. Avocado. Prunes and raisins. Dried figs. ?Vegetables ?Sweet potatoes. Spinach. Kale. Artichokes. Cabbage. Broccoli. Cauliflower. Green peas. Carrots. Squash. ?Grains ?Whole-grain breads. Multigrain cereal. Oats and oatmeal. Brown rice. Barley. Bulgur wheat. [place]. Quinoa. Bran muffins. Popcorn. Rye wafer crackers. ?Meats and other proteins ?Navy beans, kidney beans, and pinto beans. Soybeans. Split peas. Lentils. Nuts and seeds. ?Dairy ?Fiber-fortified yogurt. ?Beverages ?Fiber-fortified soy milk. Fiber-fortified orange juice. ?Other foods ?Fiber bars. ?The items listed above may not be a complete list of recommended foods and beverages. Contact a dietitian for more information. ?What foods should I avoid? ?Fruits ?Fruit juice. Cooked, strained fruit. ?Vegetables ?Fried potatoes. Canned vegetables. Well-cooked vegetables. ?Grains ?White bread. Pasta made with refined flour. White rice. ?Meats and other proteins ?Fatty cuts of meat. Fried chicken or fried fish. ?Dairy ?Milk. Yogurt. Cream cheese. Sour cream. ?Fats and oils ?Butters. ?Beverages ?Soft drinks. ?Other foods ?Cakes and pastries. ?The items listed above may not be a complete list of foods and beverages to avoid. Talk with your dietitian about what choices are best for you. ?Summary ?Fiber is a type of carbohydrate. It is found in foods such as fruits, vegetables, whole grains, and beans. ?A high-fiber diet has many benefits. It can help to prevent constipation, lower blood cholesterol, aid weight loss, and reduce your risk of heart disease, diabetes, and certain cancers. ?Increase your intake of fiber gradually. Increasing fiber too quickly may cause cramping, bloating, and gas. Drink plenty of water while you increase the amount of fiber you  consume. ?The best sources of fiber include whole fruits and vegetables, whole grains, nuts, seeds, and beans. ?This information is not intended to replace advice given to you by your health care provider. Make sure you discuss any questions you have with your health care provider. ?Document Revised: 07/12/2019 Document Reviewed: 07/12/2019 ?Elsevier Patient Education ? [place]. ?  ?

## 2021-05-20 NOTE — Progress Notes (Signed)
Cardiology Office Note:    Date:  05/20/2021   ID:  DEVOIRY CORRIHER, DOB 01-26-1946, MRN [id number]  PCP:  Zoila Shutter, NP   [facility] Providers Cardiologist:  Kirk Ruths, MD      Referring MD: Zoila Shutter, NP   Follow-up for palpitations  History of Present Illness:    Andrea Bates is a 76 y.o. female with a hx of  aortic atherosclerosis, dysphagia, GERD, hypothyroidism, hyperlipidemia, constipation, vitamin D deficiency, and vertigo.  Her PMH also includes COVID-19 infection and palpitations.   She had a cardiac catheterization in February 2012 that showed normal coronary arteries, and a normal LV function.  Stress echocardiogram in April 2015 was normal. Last echocardiogram obtained in May 2018 showed normal LV function, grade 1 DD, mildly dilated aortic root.  Heart monitor May 2018 showed sinus rhythm, PACs, PVCs and brief PAT. She is intolerant of beta-blocker and calcium channel blocker and also intolerant of statins.    She was  seen by Dr. Stanford Breed April 2019, at which time she continued to describe occasional palpitation.  She declined a trial on the beta-blocker at the time.  Her hyperlipidemia is managed by diet and exercise and did not wish to try Zetia at the time.     08/25/2018 she stated she had noticed increased palpitations over the last 1 to 2 weeks.  She was noticing some increased shortness of breath but, stated that it was intermittent and she was still exercising 3-4 times a week.  She again noted that she did not tolerate beta blocking agents well.  She endorsed mild to moderate caffeine use, and consuming chocolate covered almonds.  She denies chest pain, lower extremity edema, orthopnea and PND.  Her echocardiogram at that time showed ejection fraction of 60 to 65%, impaired relaxation, mild thickening of mitral valve leaflet, and dilation of the ascending aorta measuring 40 mm.  EKG 08/28/2018 showed sinus rhythm with PVCs 69 bpm.   She  presented to the clinic 06/27/20 for follow-up evaluation stated she had noticed more palpitations over the last several months.  She was intolerant of beta blocking agents.  She had done her best to eliminate triggers from her diet as well as foods that may cause gas.  She was following with GI who planned a camera study to further evaluate her colon.  She reported that she felt the pressure and gas may be contributing to her awareness of her palpitations.  She continued to be physically active walking on her treadmill 20-30 minutes and doing housework.  We  gave her the salty 6 diet sheet, had her maintain her physical activity, ordering an echocardiogram and planned follow-up in 12 months.  Her echocardiogram showed her pumping function was normal.  She has grade 1 diastolic dysfunction.  No other significant valvular abnormalities were noted   She presented to the clinic 07/30/20 for follow-up evaluation stated she had not started her metoprolol succinate.  She reported she was very sensitive to medications.  She had been to see GI for follow-up evaluation and had 2 tests planned.  A camera study and a small intestine bacterial overgrowth study.  We reviewed her echocardiogram.  I encouraged her to start metoprolol succinate 12.5 mg daily to help with her palpitations and reviewed the medication.  We planned follow-up with Dr. Stanford Breed in 12 months.  She presents to the clinic today for follow-up evaluation and states she continues to follow with GI.  They have  recommended that she increase the fiber in her diet.  She continues to struggle with gas type pain.  We reviewed her metoprolol and she is taking it 3-4 times since being seen last.  We reviewed her blood pressures which have been stable.  She reports continued palpitations.  Her EKG today shows sinus rhythm with occasional premature ventricular complexes and possible left atrial enlargement 73 bpm.  I encouraged her to take her metoprolol daily.  We  again reviewed her echocardiogram and she expressed understanding.  I encouraged her to continue Gas-X medication, resume taking metoprolol succinate and plan follow-up for 6-9 months.   Today she denies chest pain, shortness of breath, lower extremity edema, fatigue, melena, hematuria, hemoptysis, diaphoresis, weakness, presyncope, syncope, orthopnea, and PND.  Past Medical History:  Diagnosis Date   Chest pain    a. negative myoview 2011 [name]);  b. 04/2010 Cath: normal cors   Colon polyps    Diverticulosis    GERD (gastroesophageal reflux disease)    a. mild esophagitis on egd 2012   Hemorrhoids    Hiatal hernia    Hyperlipidemia    Hyperlipidemia    Is now taking Fish oil. Had myalgias with Vytorin and Lipitor   Hypothyroidism    Irritable bowel syndrome    Palpitations    a. 04/2008 echo: EF 50-60%;  b. 21 day event monitor 2012 - occasional sinus tachycardia with rates 110-120's. Rare PAC's/PVC's. Serveral short 4-5 beat runs of a-tach. c. 06/2016: Event monitor showing PAC's, PVC's, and brief episodes of PAT.    Personal history of colonic polyps    a. colonoscopy 2009   Personal history of malignant neoplasm of breast    Pre-diabetes    Syncope (VasoVagal Faint) 04/27/2010    Past Surgical History:  Procedure Laterality Date   BUNIONECTOMY     CARPAL TUNNEL RELEASE     bilateral    COLONOSCOPY  07/04/2013   Polyp, Polypectomy. Moderate diverticulosis in L colon. BX: Tubular Adenoma no dyspasia or malignancy. 5 yr rpt colon   NASAL SINUS SURGERY     TONSILLECTOMY     UPPER GASTROINTESTINAL ENDOSCOPY  08/09/2016   normal EGD   UPPER GASTROINTESTINAL ENDOSCOPY  07/04/2013   normal EGD    Current Medications: No outpatient medications have been marked as taking for the 05/20/21 encounter (Appointment) with Deberah Pelton, NP.     Allergies:   Iodine, Armour thyroid [thyroid], Atenolol, Celexa [citalopram hydrobromide], Diltiazem hcl, Levaquin [levofloxacin in d5w],  Lipitor [atorvastatin], Propranolol hcl, Shellfish allergy, and Vytorin [ezetimibe-simvastatin]   Social History   Socioeconomic History   Marital status: Married    Spouse name: [name]   Number of children: 2   Years of education: Not on file   Highest education level: Not on file  Occupational History   Occupation: Retired    Comment: Freight forwarder: [employer]  Tobacco Use   Smoking status: Never   Smokeless tobacco: Never  Vaping Use   Vaping Use: Never used  Substance and Sexual Activity   Alcohol use: No   Drug use: No   Sexual activity: Not on file  Other Topics Concern   Not on file  Social History Narrative   Not on file   Social Determinants of Health   Financial Resource Strain: Not on file  Food Insecurity: Not on file  Transportation Needs: Not on file  Physical Activity: Not on file  Stress: Not on file  Social Connections: Not on file     Family History: The patient's family history includes Breast cancer in her paternal aunt; Colon cancer in an other family member; Diabetes in her paternal grandmother; Heart defect in her father.  ROS:   Please see the history of present illness.     All other systems reviewed and are negative.   Risk Assessment/Calculations:           Physical Exam:    VS:  There were no vitals taken for this visit.    Wt Readings from Last 3 Encounters:  04/27/21 108 lb 2 oz (49 kg)  07/30/20 104 lb 6.4 oz (47.4 kg)  07/30/20 104 lb 2 oz (47.2 kg)     GEN:  Well nourished, well developed in no acute distress HEENT: Normal NECK: No JVD; No carotid bruits LYMPHATICS: No lymphadenopathy CARDIAC: RRR, no murmurs, rubs, gallops RESPIRATORY:  Clear to auscultation without rales, wheezing or rhonchi  ABDOMEN: Soft, non-tender, non-distended MUSCULOSKELETAL:  No edema; No deformity  SKIN: Warm and dry NEUROLOGIC:  Alert and oriented x 3 PSYCHIATRIC:  Normal affect    EKGs/Labs/Other Studies  Reviewed:    The following studies were reviewed today: EKG 28-Jun-2020 sinus rhythm with possible premature atrial complexes and premature ventricular complexes 78 bpm   Echocardiogram 09/15/2018 IMPRESSIONS     1. The left ventricle has normal systolic function with an ejection  fraction of 60-65%. The cavity size was normal. Left ventricular diastolic  Doppler parameters are consistent with impaired relaxation. Indeterminate  filling pressures The E/e' is 8-15.  No evidence of left ventricular regional wall motion abnormalities.   2. The right ventricle has normal systolic function. The cavity was  normal. There is no increase in right ventricular wall thickness.   3. The mitral valve is abnormal. Mild thickening of the mitral valve  leaflet.   4. The tricuspid valve is grossly normal.   5. The aortic valve is tricuspid. No stenosis of the aortic valve.   6. There is mild dilatation of the ascending aorta measuring 40 mm.   7. The average left ventricular global longitudinal strain is -21.7 %.   Echocardiogram 07/10/2020 IMPRESSIONS     1. Left ventricular ejection fraction, by estimation, is 65 to 70%. The  left ventricle has normal function. The left ventricle has no regional  wall motion abnormalities. Left ventricular diastolic parameters are  consistent with Grade I diastolic  dysfunction (impaired relaxation).   2. Right ventricular systolic function is normal. The right ventricular  size is normal. Tricuspid regurgitation signal is inadequate for assessing  PA pressure.   3. The mitral valve is normal in structure. Trivial mitral valve  regurgitation. No evidence of mitral stenosis.   4. The aortic valve is tricuspid. There is mild calcification of the  aortic valve. Aortic valve regurgitation is not visualized. No aortic  stenosis is present.   5. The inferior vena cava is normal in size with greater than 50%  respiratory variability, suggesting right atrial pressure  of 3 mmHg.   Comparison(s): No significant change from prior study.  EKG:  EKG is  ordered today.  The ekg ordered today demonstrates sinus rhythm with occasional premature ventricular complexes possible left atrial enlargement 73 bpm  Recent Labs: 05/06/2021: ALT 55; BUN 19; Creatinine, Ser 0.78; Potassium 4.4; Sodium 138  Recent Lipid Panel    Component Value Date/Time   CHOL 285 (H) 11/29/2018 1238   CHOL 276 (H) 06/10/2014 1242  TRIG 78 11/29/2018 1238   TRIG 61 06/10/2014 1242   HDL 84 11/29/2018 1238   HDL 80 06/10/2014 1242   CHOLHDL 3.4 11/29/2018 1238   VLDL 11 07/16/2015 0914   LDLCALC 183 (H) 11/29/2018 1238   LDLCALC 184 (H) 06/10/2014 1242   LDLDIRECT 180.1 02/01/2013 1608    ASSESSMENT & PLAN     Palpitations-EKG today shows sinus rhythm with occasional premature ventricular complexes possible left atrial enlargement 73 bpm.  Reports  consistent episode of palpitations.  We reviewed her previous echocardiogram and her EKG.  She expressed understanding.  Previously felt that increased gas was contributing to more awareness of her symptoms of palpitations, symptoms seem to be exaggerated with increased gas and not taking Gas-X. Continue metoprolol succinate 12.5 mg daily Continue Gas-X Heart healthy low-sodium high-fiber diet-instructions given Increase physical activity as tolerated Avoid triggers caffeine, chocolate, EtOH, dehydration etc.-reviewed   Shortness of breath-chronic/stable.   Heart healthy low-sodium diet-salty 6 given Increase physical activity as tolerated   Fatigue-stable.  Reviewed the importance of consistency with physical activity.     Hypothyroidism-last TSH 1.39.  Reports compliance with medical therapy. Continue Synthroid Follows with PCP      Disposition: Follow-up with Dr. Stanford Breed in 6-9 months.        Medication Adjustments/Labs and Tests Ordered: Current medicines are reviewed at length with the patient today.  Concerns  regarding medicines are outlined above.  No orders of the defined types were placed in this encounter.  No orders of the defined types were placed in this encounter.   There are no Patient Instructions on file for this visit.   Signed, Deberah Pelton, NP  05/20/2021 6:25 AM      Notice: This dictation was prepared with Dragon dictation along with smaller phrase technology. Any transcriptional errors that result from this process are unintentional and may not be corrected upon review.  I spent 14 minutes examining this patient, reviewing medications, and using patient centered shared decision making involving her cardiac care.  Prior to her visit I spent greater than 20 minutes reviewing her past medical history,  medications, and prior cardiac tests.

## 2021-05-22 ENCOUNTER — Other Ambulatory Visit: Payer: Self-pay

## 2021-05-22 MED ORDER — AMITRIPTYLINE HCL 25 MG PO TABS: 12.5000 mg | ORAL_TABLET | Freq: Every day | ORAL | 6 refills | Status: DC

## 2021-06-02 ENCOUNTER — Ambulatory Visit: Payer: PPO | Admitting: Cardiology

## 2021-10-28 ENCOUNTER — Encounter: Payer: Self-pay | Admitting: Nurse Practitioner

## 2021-10-28 ENCOUNTER — Ambulatory Visit: Payer: PPO | Admitting: Nurse Practitioner

## 2021-10-28 ENCOUNTER — Ambulatory Visit (INDEPENDENT_AMBULATORY_CARE_PROVIDER_SITE_OTHER): Payer: PPO

## 2021-10-28 VITALS — BP 118/68 | HR 73 | Ht 62.0 in | Wt 109.0 lb

## 2021-10-28 DIAGNOSIS — R5383 Other fatigue: Secondary | ICD-10-CM | POA: Diagnosis not present

## 2021-10-28 DIAGNOSIS — E038 Other specified hypothyroidism: Secondary | ICD-10-CM

## 2021-10-28 DIAGNOSIS — R0602 Shortness of breath: Secondary | ICD-10-CM

## 2021-10-28 DIAGNOSIS — R002 Palpitations: Secondary | ICD-10-CM

## 2021-10-28 MED ORDER — METOPROLOL SUCCINATE ER 25 MG PO TB24: 25.0000 mg | ORAL_TABLET | Freq: Every day | ORAL | 3 refills | Status: AC

## 2021-10-28 NOTE — Patient Instructions (Addendum)
Medication Instructions:  Increase Metoprolol 25 mg daily  *If you need a refill on your cardiac medications before your next appointment, please call your pharmacy*   Lab Work: NONE ordered at this time of appointment   If you have labs (blood work) drawn today and your tests are completely normal, you will receive your results only by: [place] (if you have MyChart) OR A paper copy in the mail If you have any lab test that is abnormal or we need to change your treatment, we will call you to review the results.   Testing/Procedures: Your physician has requested that you have an echocardiogram. Echocardiography is a painless test that uses sound waves to create images of your heart. It provides your doctor with information about the size and shape of your heart and how well your heart's chambers and valves are working. This procedure takes approximately one hour. There are no restrictions for this procedure.   ZIO XT- Long Term Monitor Instructions  Your physician has requested you wear a ZIO patch monitor for 14 days.  This is a single patch monitor. [employer] supplies one patch monitor per enrollment. Additional stickers are not available. Please do not apply patch if you will be having a Nuclear Stress Test,  Echocardiogram, Cardiac CT, MRI, or Chest Xray during the period you would be wearing the  monitor. The patch cannot be worn during these tests. You cannot remove and re-apply the  ZIO XT patch monitor.  Your ZIO patch monitor will be mailed 3 day USPS to your address on file. It may take 3-5 days  to receive your monitor after you have been enrolled.  Once you have received your monitor, please review the enclosed instructions. Your monitor  has already been registered assigning a specific monitor serial # to you.  Billing and Patient Assistance Program Information  We have supplied [employer] with any of your insurance information on file for billing purposes. [employer]  offers a sliding scale Patient Assistance Program for patients that do not have  insurance, or whose insurance does not completely cover the cost of the ZIO monitor.  You must apply for the Patient Assistance Program to qualify for this discounted rate.  To apply, please call [employer] at [phone], select option 4, select option 2, ask to apply for  Patient Assistance Program. Theodore Demark will ask your household income, and how many people  are in your household. They will quote your out-of-pocket cost based on that information.  [employer] will also be able to set up a 15-month interest-free payment plan if needed.  Applying the monitor   Shave hair from upper left chest.  Hold abrader disc by orange tab. Rub abrader in 40 strokes over the upper left chest as  indicated in your monitor instructions.  Clean area with 4 enclosed alcohol pads. Let dry.  Apply patch as indicated in monitor instructions. Patch will be placed under collarbone on left  side of chest with arrow pointing upward.  Rub patch adhesive wings for 2 minutes. Remove white label marked "1". Remove the white  label marked "2". Rub patch adhesive wings for 2 additional minutes.  While looking in a mirror, press and release button in center of patch. A small green light will  flash 3-4 times. This will be your only indicator that the monitor has been turned on.  Do not shower for the first 24 hours. You may shower after the first 24 hours.  Press the button if you feel  a symptom. You will hear a small click. Record Date, Time and  Symptom in the Patient Logbook.  When you are ready to remove the patch, follow instructions on the last 2 pages of Patient  Logbook. Stick patch monitor onto the last page of Patient Logbook.  Place Patient Logbook in the blue and white box. Use locking tab on box and tape box closed  securely. The blue and white box has prepaid postage on it. Please place it in the mailbox as  soon as possible. Your  physician should have your test results approximately 7 days after the  monitor has been mailed back to [facility].  Call [place] at [phone] if you have questions regarding  your ZIO XT patch monitor. Call them immediately if you see an orange light blinking on your  monitor.  If your monitor falls off in less than 4 days, contact our Monitor department at [phone].  If your monitor becomes loose or falls off after 4 days call [employer] at [phone] for  suggestions on securing your monitor   Follow-Up: At [facility] you and your health needs are our priority.  As part of our continuing mission to provide you with exceptional heart care, we have created designated Provider Care Teams.  These Care Teams include your primary Cardiologist (physician) and Advanced Practice Providers (APPs -  Physician Assistants and Nurse Practitioners) who all work together to provide you with the care you need, when you need it.  We recommend signing up for the patient portal called "MyChart".  Sign up information is provided on this After Visit Summary.  MyChart is used to connect with patients for Virtual Visits (Telemedicine).  Patients are able to view lab/test results, encounter notes, upcoming appointments, etc.  Non-urgent messages can be sent to your provider as well.   To learn more about what you can do with MyChart, go to [email or web address].    Your next appointment:   3 month(s)  The format for your next appointment:   In Person  Provider:   Kirk Ruths, MD  or Diona Browner, NP        Other Instructions Monitor Blood Pressure. Report systolic blood pressure lower than 100 (top number).  Referral sent to EP. We will request labs f07 your Primary Care Physician.  Important Information About Sugar

## 2021-10-28 NOTE — Progress Notes (Unsigned)
Enrolled for [employer] to mail a ZIO XT long term holter monitor to the patients address on file.   Dr. Crenshaw to read. 

## 2021-10-28 NOTE — Progress Notes (Signed)
Office Visit    Patient Name: Andrea Bates Date of Encounter: 10/28/2021  Primary Care Provider:  Zoila Shutter, NP Primary Cardiologist:  Kirk Ruths, MD  Chief Complaint    76 year old female with a history of palpitations, aortic atherosclerosis, hyperlipidemia, hypothyroidism, dysphagia, GERD, chronic constipation, vitamin D deficiency, and vertigo who presents for follow-up related to palpitations and chest pain.  Past Medical History    Past Medical History:  Diagnosis Date   Chest pain    a. negative myoview 2011 [name]);  b. 04/2010 Cath: normal cors   Colon polyps    Diverticulosis    GERD (gastroesophageal reflux disease)    a. mild esophagitis on egd 2012   Hemorrhoids    Hiatal hernia    Hyperlipidemia    Hyperlipidemia    Is now taking Fish oil. Had myalgias with Vytorin and Lipitor   Hypothyroidism    Irritable bowel syndrome    Palpitations    a. 04/2008 echo: EF 50-60%;  b. 21 day event monitor 2012 - occasional sinus tachycardia with rates 110-120's. Rare PAC's/PVC's. Serveral short 4-5 beat runs of a-tach. c. 06/2016: Event monitor showing PAC's, PVC's, and brief episodes of PAT.    Personal history of colonic polyps    a. colonoscopy 2009   Personal history of malignant neoplasm of breast    Pre-diabetes    Syncope (VasoVagal Faint) 04/27/2010   Past Surgical History:  Procedure Laterality Date   BUNIONECTOMY     CARPAL TUNNEL RELEASE     bilateral    COLONOSCOPY  07/04/2013   Polyp, Polypectomy. Moderate diverticulosis in L colon. BX: Tubular Adenoma no dyspasia or malignancy. 5 yr rpt colon   NASAL SINUS SURGERY     TONSILLECTOMY     UPPER GASTROINTESTINAL ENDOSCOPY  08/09/2016   normal EGD   UPPER GASTROINTESTINAL ENDOSCOPY  07/04/2013   normal EGD    Allergies  Allergies  Allergen Reactions   Iodine Palpitations    NOT CT CONTRAST.    Armour Thyroid [Thyroid]     Palpitations    Atenolol     presyncope   Celexa  [Citalopram Hydrobromide]     Severe chest tightness    Diltiazem Hcl     Dropped bp   Levaquin [Levofloxacin In D5w] Nausea And Vomiting   Lipitor [Atorvastatin]     Myalgias    Propranolol Hcl     Felt bad   Shellfish Allergy    Vytorin [Ezetimibe-Simvastatin]     Myalgias     History of Present Illness    76 year old female with a history of palpitations, aortic atherosclerosis, hyperlipidemia, hypothyroidism, dysphagia, GERD, chronic constipation, vitamin D deficiency, and vertigo.  Cardiac catheterization in February 2012 showed normal coronary arteries, normal LV function.  Stress echo in April 2015 was normal.  Echocardiogram in 2018 showed normal LV function, G1 DD, aortic root. She has a history of palpitations.  Cardiac monitor in May 2018 showed sinus rhythm, PACs, PVCs and brief PAT.  She is intolerant of beta-blocker and calcium channel blocker, also intolerant of statins.  Repeat echocardiogram in April 2022 showed EF 65 to 70%, no RWMA, G1 DD, no significant valvular abnormalities.  She was last seen in the office on 05/20/2021 and reported ongoing palpitations.  She was advised to take her metoprolol daily.  She felt that increased gas may have contributed to her increased palpitations, she was advised to take Gas-X, triggers, and increase physical activity as tolerated.  She presents  today for follow-up accompanied by her husband. Since her last visit she has been stable overall from a cardiac standpoint though she does note increased palpitations over the past several weeks. She was at her PCPs office and was noted to have frequent PVCs on her EKG. EKG today shows frequent PVCs. Additionally, she notes generalized fatigue, occasional dyspnea. She denies any other symptoms concerning for angina. She is frustrated that her symptoms have been persistent and have recently worsened. Additionally, she continues to report left upper quadrant pain and persistent gas.  She is following  with GI for this and has been told that she may be referred to [facility] for further evaluation. Other than her persistent palpitations, fatigue, and occasional dyspnea, she denies any additional concerns today.  Home Medications    Current Outpatient Medications  Medication Sig Dispense Refill   Cholecalciferol (VITAMIN D) 125 MCG (5000 UT) CAPS Take 1 capsule by mouth daily.     Coenzyme Q10 (COQ10) 100 MG CAPS Take 100 mg by mouth daily. Reported on 07/18/2015     Digestive Enzymes (DIGESTIVE ENZYME ULTRA PO) Take 1 tablet by mouth daily.     fish oil-omega-3 fatty acids 1000 MG capsule Take 1 g by mouth daily. Reported on 07/18/2015     Magnesium 300 MG CAPS Take 1 capsule by mouth 2 (two) times daily.     metoprolol succinate (TOPROL XL) 25 MG 24 hr tablet Take 1 tablet (25 mg total) by mouth daily. 90 tablet 3   mometasone (NASONEX) 50 MCG/ACT nasal spray Place 2 sprays into the nose daily.     POTASSIUM CHLORIDE PO Take 90 mcg by mouth every other day.     Simethicone 250 MG CAPS Take 1 capsule by mouth daily.     SYNTHROID 50 MCG tablet TAKE 1 TABLET DAILY ON AN EMPTY STOMACH WITH ONLY WATER FOR 30 MINUTES & NO ANTACID MEDS, CALCIUM OR MAGNESIUM FOR 4 HOURS & AVOID BIOTIN 90 tablet 1   No current facility-administered medications for this visit.     Review of Systems    She denies chest pain, palpitations, dyspnea, pnd, orthopnea, n, v, dizziness, syncope, edema, weight gain, or early satiety. All other systems reviewed and are otherwise negative except as noted above.   Physical Exam    VS:  BP 118/68   Pulse 73   Ht '5\' 2"'$  (1.575 m)   Wt 109 lb (49.4 kg)   SpO2 97%   BMI 19.94 kg/m   GEN: Well nourished, well developed, in no acute distress. HEENT: normal. Neck: Supple, no JVD, carotid bruits, or masses. Cardiac: RRR, no murmurs, rubs, or gallops. No clubbing, cyanosis, edema.  Radials/DP/PT 2+ and equal bilaterally.  Respiratory:  Respirations regular and unlabored, clear to  auscultation bilaterally. GI: Soft, nontender, nondistended, BS + x 4. MS: no deformity or atrophy. Skin: warm and dry, no rash. Neuro:  Strength and sensation are intact. Psych: Normal affect.  Accessory Clinical Findings    ECG personally reviewed by me today - sinus rhythm, 73 bpm, frequent PVC - no acute changes.  Lab Results  Component Value Date   WBC 7.7 03/24/2020   HGB 13.6 03/24/2020   HCT 40.2 03/24/2020   MCV 87.9 03/24/2020   PLT 359.0 03/24/2020   Lab Results  Component Value Date   CREATININE 0.78 05/06/2021   BUN 19 05/06/2021   NA 138 05/06/2021   K 4.4 05/06/2021   CL 103 05/06/2021   CO2 31 05/06/2021  Lab Results  Component Value Date   ALT 55 (H) 05/06/2021   AST 28 05/06/2021   ALKPHOS 123 (H) 05/06/2021   BILITOT 0.4 05/06/2021   Lab Results  Component Value Date   CHOL 285 (H) 11/29/2018   HDL 84 11/29/2018   LDLCALC 183 (H) 11/29/2018   LDLDIRECT 180.1 02/01/2013   TRIG 78 11/29/2018   CHOLHDL 3.4 11/29/2018    Lab Results  Component Value Date   HGBA1C 5.4 05/09/2018    Assessment & Plan   1. Palpitations: Cath in February 2012 showed normal coronary arteries, normal LV function.  Stress echo in April 2015 was normal.  Echocardiogram in 2018 showed normal LV function, G1 DD, aortic root. She has a history of palpitations. Cardiac monitor in May 2018 showed sinus rhythm, PACs, PVCs and brief PAT. She reports persistent palpitations that have worsened frequently.  EKG shows frequent PVCs. She states she just had labs drawn with her PCP including TSH-will request labs. Historically, she has been intolerant to beta-blockers and calcium channel blockers.  She is tolerating 12.5 mg metoprolol.  Will try increasing metoprolol to 25 mg. Will repeat 7-day ZIO monitor to assess PVC burden. Will repeat echo and refer to EP for further management.  Continue metoprolol.  2. Chronic dyspnea/fatigue: She reports generalized fatigue, chronic dyspnea.   Prior ischemic evaluations reassuring. Will repeat echo. If any abnormalities or symptoms persist, could consider ischemic evaluation (?cardiac PET stress test).   3. Hypothyroidism: TSH was drawn per PCP. Will request labs.   4. Disposition: Follow-up in 3 months with general cardiology, follow-up with EP.   Lenna Sciara, NP 10/28/2021, 5:14 PM

## 2021-11-02 NOTE — Progress Notes (Deleted)
HPI: Follow-up palpitations, hyperlipidemia and family history of early coronary disease. Cardiac catheterization February 2012 showed normal coronary arteries and normal LV function. Stress echocardiogram April 2015 normal. She has been intolerant to beta blockers and calcium blockers. Also intolerant to statins. Monitor May 2018 showed sinus rhythm with PACs, PVCs and brief PAT. echocardiogram April 2022 showed normal LV function, grade 1 diastolic dysfunction.  Monitor August 2023 showed.  Since last seen,   Current Outpatient Medications  Medication Sig Dispense Refill   Cholecalciferol (VITAMIN D) 125 MCG (5000 UT) CAPS Take 1 capsule by mouth daily.     Coenzyme Q10 (COQ10) 100 MG CAPS Take 100 mg by mouth daily. Reported on 07/18/2015     Digestive Enzymes (DIGESTIVE ENZYME ULTRA PO) Take 1 tablet by mouth daily.     fish oil-omega-3 fatty acids 1000 MG capsule Take 1 g by mouth daily. Reported on 07/18/2015     Magnesium 300 MG CAPS Take 1 capsule by mouth 2 (two) times daily.     metoprolol succinate (TOPROL XL) 25 MG 24 hr tablet Take 1 tablet (25 mg total) by mouth daily. 90 tablet 3   mometasone (NASONEX) 50 MCG/ACT nasal spray Place 2 sprays into the nose daily.     POTASSIUM CHLORIDE PO Take 90 mcg by mouth every other day.     Simethicone 250 MG CAPS Take 1 capsule by mouth daily.     SYNTHROID 50 MCG tablet TAKE 1 TABLET DAILY ON AN EMPTY STOMACH WITH ONLY WATER FOR 30 MINUTES & NO ANTACID MEDS, CALCIUM OR MAGNESIUM FOR 4 HOURS & AVOID BIOTIN 90 tablet 1   No current facility-administered medications for this visit.     Past Medical History:  Diagnosis Date   Chest pain    a. negative myoview 2011 [name]);  b. 04/2010 Cath: normal cors   Colon polyps    Diverticulosis    GERD (gastroesophageal reflux disease)    a. mild esophagitis on egd 2012   Hemorrhoids    Hiatal hernia    Hyperlipidemia    Hyperlipidemia    Is now taking Fish oil. Had myalgias with  Vytorin and Lipitor   Hypothyroidism    Irritable bowel syndrome    Palpitations    a. 04/2008 echo: EF 50-60%;  b. 21 day event monitor 2012 - occasional sinus tachycardia with rates 110-120's. Rare PAC's/PVC's. Serveral short 4-5 beat runs of a-tach. c. 06/2016: Event monitor showing PAC's, PVC's, and brief episodes of PAT.    Personal history of colonic polyps    a. colonoscopy 2009   Personal history of malignant neoplasm of breast    Pre-diabetes    Syncope (VasoVagal Faint) 04/27/2010    Past Surgical History:  Procedure Laterality Date   BUNIONECTOMY     CARPAL TUNNEL RELEASE     bilateral    COLONOSCOPY  07/04/2013   Polyp, Polypectomy. Moderate diverticulosis in L colon. BX: Tubular Adenoma no dyspasia or malignancy. 5 yr rpt colon   NASAL SINUS SURGERY     TONSILLECTOMY     UPPER GASTROINTESTINAL ENDOSCOPY  08/09/2016   normal EGD   UPPER GASTROINTESTINAL ENDOSCOPY  07/04/2013   normal EGD    Social History   Socioeconomic History   Marital status: Married    Spouse name: [name]   Number of children: 2   Years of education: Not on file   Highest education level: Not on file  Occupational History   Occupation: Retired  Comment: Textile     Employer: [employer]  Tobacco Use   Smoking status: Never   Smokeless tobacco: Never  Vaping Use   Vaping Use: Never used  Substance and Sexual Activity   Alcohol use: No   Drug use: No   Sexual activity: Not on file  Other Topics Concern   Not on file  Social History Narrative   Not on file   Social Determinants of Health   Financial Resource Strain: Not on file  Food Insecurity: Not on file  Transportation Needs: Not on file  Physical Activity: Not on file  Stress: Not on file  Social Connections: Not on file  Intimate Partner Violence: Not on file    Family History  Problem Relation Age of Onset   Colon cancer Other        Paternal Great Unlce    Diabetes Paternal Grandmother    Heart  defect Father    Breast cancer Paternal Aunt     ROS: no fevers or chills, productive cough, hemoptysis, dysphasia, odynophagia, melena, hematochezia, dysuria, hematuria, rash, seizure activity, orthopnea, PND, pedal edema, claudication. Remaining systems are negative.  Physical Exam: Well-developed well-nourished in no acute distress.  Skin is warm and dry.  HEENT is normal.  Neck is supple.  Chest is clear to auscultation with normal expansion.  Cardiovascular exam is regular rate and rhythm.  Abdominal exam nontender or distended. No masses palpated. Extremities show no edema. neuro grossly intact  ECG- personally reviewed  A/P  1 palpitations-she was seen recently with complaints of increased palpitations.  Note at that time she was tolerating metoprolol 12.5 mg twice daily and this was increased to 25 mg twice daily.  2 hyperlipidemia-she has not tolerated statins previously.  She does not wish to consider other lipid-lowering medications.  3 anxiety-Per primary care.  Kirk Ruths, MD

## 2021-11-03 DIAGNOSIS — R0602 Shortness of breath: Secondary | ICD-10-CM | POA: Diagnosis not present

## 2021-11-03 DIAGNOSIS — R002 Palpitations: Secondary | ICD-10-CM | POA: Diagnosis not present

## 2021-11-03 DIAGNOSIS — E038 Other specified hypothyroidism: Secondary | ICD-10-CM

## 2021-11-03 DIAGNOSIS — R5383 Other fatigue: Secondary | ICD-10-CM

## 2021-11-13 ENCOUNTER — Ambulatory Visit (HOSPITAL_COMMUNITY): Payer: PPO | Attending: Cardiology

## 2021-11-13 ENCOUNTER — Ambulatory Visit: Payer: PPO | Admitting: Cardiology

## 2021-11-13 DIAGNOSIS — R0602 Shortness of breath: Secondary | ICD-10-CM | POA: Insufficient documentation

## 2021-11-13 DIAGNOSIS — R002 Palpitations: Secondary | ICD-10-CM | POA: Diagnosis present

## 2021-11-13 DIAGNOSIS — R5383 Other fatigue: Secondary | ICD-10-CM | POA: Diagnosis present

## 2021-11-13 DIAGNOSIS — E038 Other specified hypothyroidism: Secondary | ICD-10-CM | POA: Insufficient documentation

## 2021-11-13 LAB — ECHOCARDIOGRAM COMPLETE
Area-P 1/2: 3.85 cm2
S' Lateral: 2.8 cm

## 2021-11-18 ENCOUNTER — Ambulatory Visit (INDEPENDENT_AMBULATORY_CARE_PROVIDER_SITE_OTHER): Payer: PPO | Admitting: Physician Assistant

## 2021-11-18 ENCOUNTER — Encounter: Payer: Self-pay | Admitting: Physician Assistant

## 2021-11-18 ENCOUNTER — Telehealth: Payer: Self-pay

## 2021-11-18 VITALS — BP 134/88 | HR 74 | Ht 62.0 in | Wt 109.0 lb

## 2021-11-18 DIAGNOSIS — K581 Irritable bowel syndrome with constipation: Secondary | ICD-10-CM | POA: Diagnosis not present

## 2021-11-18 DIAGNOSIS — R1012 Left upper quadrant pain: Secondary | ICD-10-CM

## 2021-11-18 NOTE — Telephone Encounter (Signed)
Spoke with pt. Pt was notified of echo results. Pt will continue her current medications and follow up as planned.

## 2021-11-18 NOTE — Progress Notes (Signed)
Chief Complaint: IBS and abdominal pain  HPI:    Andrea Bates is a 76 year old female with a past medical history as listed below including reflux and IBS, known to Dr. Lyndel Safe, who was referred to me by Zoila Shutter, NP for a complaint of IBS and abdominal pain.      04/27/2021 office visit with Dr. Lyndel Safe for abdominal pain in the epigastric region and left upper quadrant, noted negative mesenteric Doppler 09/2019, negative EGD, GES 2018, and recent CTAP, normal GES 07/2020.  Patient has history of SIBO 09/2018 treated with Rifaximin.  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  Described history of tubular adenomas 07/2018 with repeat colonoscopy due 07/2023 and reflux with a small hiatal hernia.  At that time started on Amitriptyline 25 mg nightly and free prescribed Rifaximin 550 p.o. 3 times daily x7 days.  Discussed Gas-X 3 times daily with each meal.  Discussed that if she continued problems would order CTAP with contrast.  Also discussed possibility of Motegrity in the future.    05/12/2021 CT the abdomen pelvis with contrast with no acute abdominal pelvic findings, large volume of formed stool throughout the colon suggestive of constipation, left-sided colonic diverticulosis without findings of acute diverticulitis and aortic atherosclerosis.  Patient was told to start taking MiraLAX p.o. twice daily until large bowel movement, then once a day.    10/28/2021 patient followed with cardiology in regards to palpitations and chest pain.  Discussed a EKG that showed frequent PVCs.  At that time her Metoprolol was increased from 12.5 mg to 25 mg.    Today, patient explains that she is continuing with her chronic symptoms and we discuss her left upper quadrant pressure that feels "like a fist" under her ribs and is there constantly.  Tells me most recently she developed some heart palpitations and started reading up about her Gas-X which she would use  every night and read that it could cause heart arrhythmia so she stopped it 3 to 4 days ago and feels like it has helped a little bit and she is actually having a bowel movement every morning without the assistance of MiraLAX or smooth move tea which is different for her.  Tells me though that regardless of having a good bowel movement or not, or feeling cleaned out or not,  she continues with this left upper quadrant pressure that sometimes get worse and spreads across her abdomen.  Does tell me that the Gas-X was previously helping a little bit with this and possibly some eructations and gas in her epigastrium as well, not taking this has led to an increase in these symptoms.  Describes trying Amitriptyline but was worried about side effects and did not feel like it helped much so she stopped.  Also retrialed Rifaximin but did not feel like it did much this around.      Tells me she just does not know where all of this came out of, it seemed to come out of the blue, apparently she has thought back to it all and feels like it may be started after she was doing some physical labor/mopping the floor and putting out pine chips for a few days and after that everything seemed to change.  Notes that she has some compression of disks in her back and has been offered steroid injections before and has read that this could be affecting her gut motility and left upper quadrant pain.  Denies fever, chills, blood in her stool, weight loss, nausea, vomiting or symptoms that awaken her from sleep.  Past Medical History:  Diagnosis Date   Chest pain    a. negative myoview 2011 [name]);  b. 04/2010 Cath: normal cors   Colon polyps    Diverticulosis    GERD (gastroesophageal reflux disease)    a. mild esophagitis on egd 2012   Hemorrhoids    Hiatal hernia    Hyperlipidemia    Hyperlipidemia    Is now taking Fish oil. Had myalgias with Vytorin and Lipitor   Hypothyroidism    Irritable bowel syndrome     Palpitations    a. 04/2008 echo: EF 50-60%;  b. 21 day event monitor 2012 - occasional sinus tachycardia with rates 110-120's. Rare PAC's/PVC's. Serveral short 4-5 beat runs of a-tach. c. 06/2016: Event monitor showing PAC's, PVC's, and brief episodes of PAT.    Personal history of colonic polyps    a. colonoscopy 2009   Personal history of malignant neoplasm of breast    Pre-diabetes    Syncope (VasoVagal Faint) 04/27/2010    Past Surgical History:  Procedure Laterality Date   BUNIONECTOMY     CARPAL TUNNEL RELEASE     bilateral    COLONOSCOPY  07/04/2013   Polyp, Polypectomy. Moderate diverticulosis in L colon. BX: Tubular Adenoma no dyspasia or malignancy. 5 yr rpt colon   NASAL SINUS SURGERY     TONSILLECTOMY     UPPER GASTROINTESTINAL ENDOSCOPY  08/09/2016   normal EGD   UPPER GASTROINTESTINAL ENDOSCOPY  07/04/2013   normal EGD    Current Outpatient Medications  Medication Sig Dispense Refill   Cholecalciferol (VITAMIN D) 125 MCG (5000 UT) CAPS Take 1 capsule by mouth daily.     Coenzyme Q10 (COQ10) 100 MG CAPS Take 100 mg by mouth daily. Reported on 07/18/2015     Digestive Enzymes (DIGESTIVE ENZYME ULTRA PO) Take 1 tablet by mouth daily.     fish oil-omega-3 fatty acids 1000 MG capsule Take 1 g by mouth daily. Reported on 07/18/2015     Magnesium 300 MG CAPS Take 1 capsule by mouth 2 (two) times daily.     metoprolol succinate (TOPROL XL) 25 MG 24 hr tablet Take 1 tablet (25 mg total) by mouth daily. 90 tablet 3   mometasone (NASONEX) 50 MCG/ACT nasal spray Place 2 sprays into the nose daily.     POTASSIUM CHLORIDE PO Take 90 mcg by mouth every other day.     Simethicone 250 MG CAPS Take 1 capsule by mouth daily.     SYNTHROID 50 MCG tablet TAKE 1 TABLET DAILY ON AN EMPTY STOMACH WITH ONLY WATER FOR 30 MINUTES & NO ANTACID MEDS, CALCIUM OR MAGNESIUM FOR 4 HOURS & AVOID BIOTIN 90 tablet 1   No current facility-administered medications for this visit.    Allergies as of  11/18/2021 - Review Complete 10/28/2021  Allergen Reaction Noted   Iodine Palpitations 04/22/2017   Armour thyroid [thyroid]  08/10/2011   Atenolol  08/10/2011   Celexa [citalopram hydrobromide]  08/10/2011   Diltiazem hcl  08/10/2011   Levaquin [levofloxacin in d5w] Nausea And Vomiting 11/11/2014   Lipitor [atorvastatin]  08/10/2011   Propranolol hcl  08/10/2011   Shellfish allergy  03/24/2020   Vytorin [ezetimibe-simvastatin]  08/10/2011    Family History  Problem Relation Age of Onset   Colon cancer Other        Paternal Great Unlce    Diabetes Paternal Grandmother  Heart defect Father    Breast cancer Paternal Aunt     Social History   Socioeconomic History   Marital status: Married    Spouse name: [name]   Number of children: 2   Years of education: Not on file   Highest education level: Not on file  Occupational History   Occupation: Retired    Comment: Freight forwarder: [employer]  Tobacco Use   Smoking status: Never   Smokeless tobacco: Never  Vaping Use   Vaping Use: Never used  Substance and Sexual Activity   Alcohol use: No   Drug use: No   Sexual activity: Not on file  Other Topics Concern   Not on file  Social History Narrative   Not on file   Social Determinants of Health   Financial Resource Strain: Not on file  Food Insecurity: Not on file  Transportation Needs: Not on file  Physical Activity: Not on file  Stress: Not on file  Social Connections: Not on file  Intimate Partner Violence: Not on file    Review of Systems:    Constitutional: No weight loss, fever or chills Cardiovascular: No chest pain Respiratory: No SOB  Gastrointestinal: See HPI and otherwise negative   Physical Exam:  Vital signs: BP (!) 174/88   Pulse 74   Ht '5\' 2"'$  (1.575 m)   Wt 109 lb (49.4 kg)   SpO2 98%   BMI 19.94 kg/m    Constitutional:   Pleasant elderly, thin appearing, Caucasian female appears to be in NAD, Well developed, Well  nourished, alert and cooperative Respiratory: Respirations even and unlabored. Lungs clear to auscultation bilaterally.   No wheezes, crackles, or rhonchi.  Cardiovascular: Normal S1, S2. No MRG. Regular rate and rhythm. No peripheral edema, cyanosis or pallor.  Gastrointestinal:  Soft, nondistended, nontender. No rebound or guarding. Increased BS all four quadrants. No appreciable masses or hepatomegaly. Rectal:  Not performed.  Psychiatric: Oriented to person, place and time. Demonstrates good judgement and reason without abnormal affect or behaviors.  RELEVANT LABS AND IMAGING: CBC    Component Value Date/Time   WBC 7.7 03/24/2020 1120   RBC 4.57 03/24/2020 1120   HGB 13.6 03/24/2020 1120   HCT 40.2 03/24/2020 1120   PLT 359.0 03/24/2020 1120   MCV 87.9 03/24/2020 1120   MCH 29.9 11/29/2018 1238   MCHC 33.8 03/24/2020 1120   RDW 14.2 03/24/2020 1120   LYMPHSABS 1,820 11/29/2018 1238   MONOABS 540 07/15/2016 1102   EOSABS 70 11/29/2018 1238   BASOSABS 63 11/29/2018 1238    CMP     Component Value Date/Time   NA 138 05/06/2021 1022   K 4.4 05/06/2021 1022   CL 103 05/06/2021 1022   CO2 31 05/06/2021 1022   GLUCOSE 87 05/06/2021 1022   BUN 19 05/06/2021 1022   CREATININE 0.78 05/06/2021 1022   CREATININE 0.74 11/29/2018 1238   CALCIUM 9.8 05/06/2021 1022   PROT 7.1 05/06/2021 1022   ALBUMIN 4.3 05/06/2021 1022   AST 28 05/06/2021 1022   ALT 55 (H) 05/06/2021 1022   ALKPHOS 123 (H) 05/06/2021 1022   BILITOT 0.4 05/06/2021 1022   GFRNONAA 82 11/29/2018 1238   GFRAA 94 11/29/2018 1238    Assessment: 1.  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  IBS-C: Patient now having a daily bowel movement in the morning, unclear if she is really emptying or not, but this does not seem to affect the above per patient,  no change after Xifaxan recently or Amitriptyline which she has stopped using  Plan: 1.  Discussed with patient that her disc compression could a role in her chronic discomfort, recommend she revisit possible steroid injections with her orthopedic surgeon.  She could at least try 1 to see if it helps at all with her gut, if not then at least she would know. 2.  For now patient will discontinue Gas-X given that it may have been aiding in her palpitations. 3.  Recommend patient try FDgard 1 tab 3 times a day for the next 2 weeks to see if this is helpful, if it is she can continue.  If not would trial IBgard instead 1 tab 3 times a day for 2 weeks, if neither of these are helpful then would recommend prescription for Movantik 25 mg p.o. daily.  She would like to try to avoid this if possible. 4.  Patient was scheduled to follow-up with Dr. Lyndel Safe in 2 to 3 months.  Ellouise Newer, PA-C [facility] 11/18/2021, 10:37 AM  Cc: Zoila Shutter, NP

## 2021-11-18 NOTE — Patient Instructions (Signed)
_______________________________________________________  If you are age 75 or older, your body mass index should be between 23-30. Your Body mass index is 19.94 kg/m. If this is out of the aforementioned range listed, please consider follow up with your Primary Care Provider.  If you are age 19 or younger, your body mass index should be between 19-25. Your Body mass index is 19.94 kg/m. If this is out of the aformentioned range listed, please consider follow up with your Primary Care Provider.   ________________________________________________________  The [place] [facility] providers would like to encourage you to use [facility] to communicate with providers for non-urgent requests or questions.  Due to long hold times on the telephone, sending your provider a message by [facility] may be a faster and more efficient way to get a response.  Please allow 48 business hours for a response.  Please remember that this is for non-urgent requests.  _______________________________________________________  Trial of FDgard 1 three times a day for 2 weeks, some samples have been provided for you.  If no improvement, we can then try the IBgard 1 three times a day for 2 weeks, some samples have been provided.  If no improvement with the IBgard or FDgard, may consider a trial of Motegrity.   Follow up with DR Lyndel Safe in 3 months.  It was a pleasure to see you today!  Thank you for trusting me with your gastrointestinal care!

## 2021-11-21 NOTE — Progress Notes (Signed)
Agree with assessment/plan.  Raj Kamelia Lampkins, MD [facility] [phone]  

## 2021-11-26 ENCOUNTER — Telehealth: Payer: Self-pay | Admitting: Physician Assistant

## 2021-11-26 MED ORDER — RIFAXIMIN 550 MG PO TABS: 550.0000 mg | ORAL_TABLET | Freq: Three times a day (TID) | ORAL | 0 refills | Status: AC

## 2021-11-26 NOTE — Telephone Encounter (Signed)
Checked 3rd floor, we do have samples of Xifaxan. I have placed samples with instructions at the 3rd floor receptionist desk for patient to pick up.   I left patient a detailed vm on her home phone letting her know that we had samples and she can stop by the office at her convenience to pick up samples. I told pt that the receptionist are here daily from 7:30 am - 4:30 pm. I informed pt that she will take 1 tablet three times daily for 14 days. I advised pt to call the office back with any questions or concerns.

## 2021-11-26 NOTE — Telephone Encounter (Signed)
Pt returned call. She states that she has tried [employer] 1 tablet with meals, usually twice a day and it helps some of her symptoms but not the abdominal cramping. Pt states that she still has abdominal cramping across her midsection. Pt states that she was treated with Xifaxan for SIBO in the past and it seemed to relieve her cramping. Pt is wondering if she should try Xifaxan again for a longer course, because she read that 5-7 day treatment only resolves about 50% of symptoms. Please advise, thanks.

## 2021-11-26 NOTE — Telephone Encounter (Signed)
Called and spoke with patient regarding Jennifer's recommendations. Pt states that she received samples of Xifaxan last time because even after her insurance it would cost her about $760. I told pt that we could try enrolling her in a patient assistance program. Pt states that she would not qualify for patient assistance since her husband is in his 15s and still working part time. I told pt that we will keep an eye out for samples and I will let her know if we have some available. Pt verbalized understanding and had no concerns at the end of the call.

## 2021-11-26 NOTE — Telephone Encounter (Signed)
Inbound call from patient seeking advice if there is anyway she can get samples of Xifaxan for the cramping in the top of the stomach for SIBO?  Please advise.

## 2021-11-26 NOTE — Telephone Encounter (Signed)
Lm on home vm for patient to return call.  Plan from 8/30 office visit   Plan: 1.  Discussed with patient that her disc compression could a role in her chronic discomfort, recommend she revisit possible steroid injections with her orthopedic surgeon.  She could at least try 1 to see if it helps at all with her gut, if not then at least she would know. 2.  For now patient will discontinue Gas-X given that it may have been aiding in her palpitations. 3.  Recommend patient try FDgard 1 tab 3 times a day for the next 2 weeks to see if this is helpful, if it is she can continue.  If not would trial IBgard instead 1 tab 3 times a day for 2 weeks, if neither of these are helpful then would recommend prescription for Movantik 25 mg p.o. daily.  She would like to try to avoid this if possible. 4.  Patient was scheduled to follow-up with Dr. Lyndel Safe in 2 to 3 months.

## 2021-12-28 ENCOUNTER — Ambulatory Visit: Payer: PPO | Admitting: Gastroenterology

## 2022-01-27 ENCOUNTER — Ambulatory Visit: Payer: PPO | Admitting: Nurse Practitioner

## 2022-02-18 ENCOUNTER — Ambulatory Visit: Payer: PPO | Admitting: Gastroenterology

## 2022-04-21 NOTE — Progress Notes (Signed)
04/23/2022 Andrea Bates [id number] 1945/06/24  Referring provider: Zoila Shutter, NP Primary GI doctor: Dr. Lyndel Safe  ASSESSMENT AND PLAN:   Irritable bowel syndrome with constipation There is also very large factor of pelvic floor dysfunction Patient very resistant to medications Will get squatty potty, getting referral to pelvic floor dysfunction from OB/GYN, given information about proper pooping technique States she usually feels better after prepping for colonoscopy, had soft stool on exam, will plan on MiraLAX purge and potentially MiraLAX with small dose of fiber afterwards for maintenance. Could consider Linzess or Motegrity but as mentioned previously patient very resistant to medications for fear of adverse events and sensitivity to medications.  Hiatal hernia with GERD Can continue FD guard Consider right upper quadrant ultrasound, recent CT unremarkable Continue diet.  History of colon polyps 07/2018 colonoscopy tubular adenomas recall 07/2023 Can consider sooner if symptoms continue or get worse Negative FOBT today  Small intestinal bacterial overgrowth (SIBO) Patient first treated for SIBO 2020 and states he made a very big difference with palpitations for her heart and some bloating.  Took again 11/2020 with some help, having similar symptoms.  Will send in prescription of Xifaxan for patient to take if needed especially after recent antibiotic.   Patient Care Team: Zoila Shutter, NP as PCP - General Stanford Breed, Denice Bors, MD as PCP - Cardiology (Cardiology) Larey Dresser, MD as Consulting Physician (Cardiology) Jackquline Denmark, MD as Consulting Physician (Gastroenterology)  HISTORY OF PRESENT ILLNESS: 77 y.o. 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   09/2018 SIBO treated with rifaximin 07/2018 colonoscopy tubular adenomas recall  07/2023 08/18/2018 EGD small hiatal hernia, gastric polyp status post polypectomy otherwise unremarkable, benign polyp, neg H pylori gastritis 09/2019 mesenteric Doppler negative 2018 , 07/2020 GES unremarkable 04/27/2021 started on amitriptyline 25 mg nightly and prescribed rifaximin 550 mg 3 times daily for 7 days, Gas-X possibility of Motegrity 05/12/2021 CT of the abdomen pelvis with contrast no acute abdominal pelvic findings, large volume of formed stool throughout the colon suggest constipation left-sided colonic diverticulosis without diverticulitis and aortic atherosclerosis Prescribed MiraLAX twice daily  11/18/2021 office visit with Anderson Malta PA felt rifaximin did not help, amitriptyline never took due to side effects, Gas-X helping slightly.  Possible musculoskeletal component. Patient supposed to follow-up with orthopedic surgeon, possible steroid injections.   Continue Gas-X, FD guard and prescription of Movantik.  States miralax caused loose stools.  11/26/2021 given Xifaxan samples without help.  She states she had a bad sinus infection a month and a half a go and she took Augmentin and now she is having issues.  She actually says while she was on the ABX the reflux and symptoms improved but has been worse last 1-2 weeks.  She states it feels like food is stopping at her stomach, has nausea and decreased appetite.  States she does not have rectal pressure or sensation of wanting a BM.  She was told by GYN that she had vaginal prolapse.   She  reports that she has never smoked. She has never used smokeless tobacco. She reports that she does not drink alcohol and does not use drugs.  Colonoscopy Impression:          -Two 4 to 6 mm polyps in the ascending colon,  removed with a cold snare. Resected and retrieved.                           -Moderate sigmoid diverticulosis.                           -Otherwise normal colonoscopy to TI. The colon was                             highly redundant.  RELEVANT LABS AND IMAGING: CBC    Component Value Date/Time   WBC 7.7 03/24/2020 1120   RBC 4.57 03/24/2020 1120   HGB 13.6 03/24/2020 1120   HCT 40.2 03/24/2020 1120   PLT 359.0 03/24/2020 1120   MCV 87.9 03/24/2020 1120   MCH 29.9 11/29/2018 1238   MCHC 33.8 03/24/2020 1120   RDW 14.2 03/24/2020 1120   LYMPHSABS 1,820 11/29/2018 1238   MONOABS 540 07/15/2016 1102   EOSABS 70 11/29/2018 1238   BASOSABS 63 11/29/2018 1238    CMP     Component Value Date/Time   NA 138 05/06/2021 1022   K 4.4 05/06/2021 1022   CL 103 05/06/2021 1022   CO2 31 05/06/2021 1022   GLUCOSE 87 05/06/2021 1022   BUN 19 05/06/2021 1022   CREATININE 0.78 05/06/2021 1022   CREATININE 0.74 11/29/2018 1238   CALCIUM 9.8 05/06/2021 1022   PROT 7.1 05/06/2021 1022   ALBUMIN 4.3 05/06/2021 1022   AST 28 05/06/2021 1022   ALT 55 (H) 05/06/2021 1022   ALKPHOS 123 (H) 05/06/2021 1022   BILITOT 0.4 05/06/2021 1022   GFRNONAA 82 11/29/2018 1238   GFRAA 94 11/29/2018 1238      Current Medications:   Current Outpatient Medications (Endocrine & Metabolic):    SYNTHROID 50 MCG tablet, TAKE 1 TABLET DAILY ON AN EMPTY STOMACH WITH ONLY WATER FOR 30 MINUTES & NO ANTACID MEDS, CALCIUM OR MAGNESIUM FOR 4 HOURS & AVOID BIOTIN  Current Outpatient Medications (Cardiovascular):    metoprolol succinate (TOPROL XL) 25 MG 24 hr tablet, Take 1 tablet (25 mg total) by mouth daily.  Current Outpatient Medications (Respiratory):    mometasone (NASONEX) 50 MCG/ACT nasal spray, Place 2 sprays into the nose daily.    Current Outpatient Medications (Other):    Cholecalciferol (VITAMIN D3) 25 MCG (1000 UT) CAPS, Take 2,000 Int'l Units by mouth daily.   Coenzyme Q10 (COQ10) 100 MG CAPS, Take 100 mg by mouth daily. Reported on 07/18/2015   Digestive Enzymes (DIGESTIVE ENZYME ULTRA PO), Take 1 tablet by mouth daily.   fish oil-omega-3 fatty acids 1000 MG capsule, Take 1 g by mouth daily.  Reported on 07/18/2015   Magnesium 300 MG CAPS, Take 1 capsule by mouth 2 (two) times daily.   POTASSIUM CHLORIDE PO, Take 90 mcg by mouth every other day.   rifaximin (XIFAXAN) 550 MG TABS tablet, Take 1 tablet (550 mg total) by mouth 3 (three) times daily for 14 days.   Simethicone 250 MG CAPS, Take 1 capsule by mouth daily.  Medical History:  Past Medical History:  Diagnosis Date   Chest pain    a. negative myoview 2011 [name]);  b. 04/2010 Cath: normal cors   Colon polyps    Diverticulosis    GERD (gastroesophageal reflux disease)    a. mild esophagitis on egd 2012   Hemorrhoids    Hiatal hernia  Hyperlipidemia    Hyperlipidemia    Is now taking Fish oil. Had myalgias with Vytorin and Lipitor   Hypothyroidism    Irritable bowel syndrome    Palpitations    a. 04/2008 echo: EF 50-60%;  b. 21 day event monitor 2012 - occasional sinus tachycardia with rates 110-120's. Rare PAC's/PVC's. Serveral short 4-5 beat runs of a-tach. c. 06/2016: Event monitor showing PAC's, PVC's, and brief episodes of PAT.    Personal history of colonic polyps    a. colonoscopy 2009   Personal history of malignant neoplasm of breast    Pre-diabetes    Syncope (VasoVagal Faint) 04/27/2010   Allergies:  Allergies  Allergen Reactions   Iodine Palpitations    NOT CT CONTRAST.    Armour Thyroid [Thyroid]     Palpitations    Atenolol     presyncope   Celexa [Citalopram Hydrobromide]     Severe chest tightness    Diltiazem Hcl     Dropped bp   Levaquin [Levofloxacin In D5w] Nausea And Vomiting   Lipitor [Atorvastatin]     Myalgias    Propranolol Hcl     Felt bad   Shellfish Allergy     Shrimp causes GI upset   Vytorin [Ezetimibe-Simvastatin]     Myalgias      Surgical History:  She  has a past surgical history that includes Nasal sinus surgery; Tonsillectomy; Carpal tunnel release; Colonoscopy (07/04/2013); Bunionectomy; Upper gastrointestinal endoscopy (08/09/2016); and Upper  gastrointestinal endoscopy (07/04/2013). Family History:  Her family history includes Breast cancer in her paternal aunt; Colon cancer in an other family member; Diabetes in her paternal grandmother; Heart defect in her father; Stomach cancer in her paternal aunt.  REVIEW OF SYSTEMS  : All other systems reviewed and negative except where noted in the History of Present Illness.  PHYSICAL EXAM: BP 124/76   Pulse 72   Ht '5\' 2"'$  (1.575 m)   Wt 114 lb (51.7 kg)   BMI 20.85 kg/m  General:   Pleasant, well developed female in no acute distress Head:   Normocephalic and atraumatic. Eyes:  sclerae anicteric,conjunctive pink  Heart:   regular rate and rhythm Pulm:  Clear anteriorly; no wheezing Abdomen:   Soft, Non-distended AB, Sluggish bowel sounds. mild tenderness in the LUQ. Without guarding and Without rebound, No organomegaly appreciated. Rectal: Normal external rectal exam, few hemorrhoidal skin tags, normal to increased rectal tone, poor rectal squeeze, appreciated small internal hemorrhoids, non-tender, no masses, large volume soft brown stool, hemoccult Negative Extremities:  Without edema. Msk: Symmetrical without gross deformities. Peripheral pulses intact.  Neurologic:  Alert and  oriented x4;  No focal deficits.  Skin:   Dry and intact without significant lesions or rashes. Psychiatric:  Cooperative. Normal mood and affect.   Vladimir Crofts, PA-C 12:23 PM

## 2022-04-23 ENCOUNTER — Ambulatory Visit: Payer: PPO | Admitting: Physician Assistant

## 2022-04-23 ENCOUNTER — Encounter: Payer: Self-pay | Admitting: Physician Assistant

## 2022-04-23 VITALS — BP 124/76 | HR 72 | Ht 62.0 in | Wt 114.0 lb

## 2022-04-23 DIAGNOSIS — K219 Gastro-esophageal reflux disease without esophagitis: Secondary | ICD-10-CM | POA: Diagnosis not present

## 2022-04-23 DIAGNOSIS — Z86010 Personal history of colonic polyps: Secondary | ICD-10-CM | POA: Diagnosis not present

## 2022-04-23 DIAGNOSIS — K581 Irritable bowel syndrome with constipation: Secondary | ICD-10-CM

## 2022-04-23 DIAGNOSIS — K449 Diaphragmatic hernia without obstruction or gangrene: Secondary | ICD-10-CM

## 2022-04-23 DIAGNOSIS — K638219 Small intestinal bacterial overgrowth, unspecified: Secondary | ICD-10-CM

## 2022-04-23 MED ORDER — RIFAXIMIN 550 MG PO TABS: 550.0000 mg | ORAL_TABLET | Freq: Three times a day (TID) | ORAL | 0 refills | Status: AC

## 2022-04-23 NOTE — Patient Instructions (Addendum)
Please do the following: Purchase a bottle of Miralax over the counter as well as a box of 5 mg dulcolax tablets. Take 4 dulcolax tablets. Wait 1 hour. You will then drink 6-8 capfuls of Miralax mixed in an adequate amount of water/juice/gatorade (you may choose which of these liquids to drink) over the next 2-3 hours. You should expect results within 1 to 6 hours after completing the bowel purge. Go to the er if you have severe AB pain, can not pass gas or stool in over 12 hours, can not hold down any food.   Miralax is an osmotic laxative.  It only brings more water into the stool.  This is safe to take daily.  Can take up to 17 gram of miralax twice a day.  Mix with juice or coffee.  Start 1 capful at night for 3-4 days and reassess your response in 3-4 days.  You can increase and decrease the dose based on your response.  Remember, it can take up to 3-4 days to take effect OR for the effects to wear off.   Can take the xifaxin if needed   Toileting tips to help with your constipation - Sit all of the way back on the toilet keeping your back fairly straight and while sitting up, try to rest the tops of your forearms on your upper thighs.   - Raising your feet with a step stool/squatty potty can be helpful to improve the angle that allows your stool to pass through the rectum. - Relax the rectum feeling it bulge toward the toilet water.  If you feel your rectum raising toward your body, you are contracting rather than relaxing. - Breathe in and slowly exhale. "Belly breath" by expanding your belly towards your belly button. Keep belly expanded as you gently direct pressure down and back to the anus.  A low pitched GRRR sound can assist with increasing intra-abdominal pressure.  - Repeat 3-4 times. If unsuccessful, contract the pelvic floor to restore normal tone and get off the toilet.  Avoid excessive straining. - To reduce excessive wiping by teaching your anus to normally contract,  place hands on outer aspect of knees and resist knee movement outward.  Hold 5-10 second then place hands just inside of knees and resist inward movement of knees.  Hold 5 seconds.  Repeat a few times each way.  Would encourage you go to pelvic floor physical therapy. I can refer you if you need to.    Pelvic Floor Dysfunction, Female Pelvic floor dysfunction (PFD) is a condition that results when the group of muscles and connective tissues that support the organs in the pelvis (pelvic floor muscles) do not work well. These muscles and their connections form a sling that supports the colon and bladder. In women, they also support the uterus. PFD causes pelvic floor muscles to be too weak, too tight, or both. In PFD, muscle movements are not coordinated. This may cause bowel or bladder problems. It may also cause pain. What are the causes? This condition may be caused by an injury to the pelvic area or by a weakening of pelvic muscles. This often results from pregnancy and childbirth or other types of strain. In many cases, the exact cause is not known. What increases the risk? The following factors may make you more likely to develop this condition: Having chronic bladder tissue inflammation (interstitial cystitis). Being an older person. Being overweight. History of radiation treatment for cancer in the pelvic region. Previous  pelvic surgery, such as removal of the uterus (hysterectomy). What are the signs or symptoms? Symptoms of this condition vary and may include: Bladder symptoms, such as: Trouble starting urination and emptying the bladder. Frequent urinary tract infections. Leaking urine when coughing, laughing, or exercising (stress incontinence). Having to pass urine urgently or frequently. Pain when passing urine. Bowel symptoms, such as: Constipation. Urgent or frequent bowel movements. Incomplete bowel movements. Painful bowel movements. Leaking stool or gas. Unexplained  genital or rectal pain. Genital or rectal muscle spasms. Low back pain. Other symptoms may include: A heavy, full, or aching feeling in the vagina. A bulge that protrudes into the vagina. Pain during or after sex. How is this diagnosed? This condition may be diagnosed based on: Your symptoms and medical history. A physical exam. During the exam, your health care provider may check your pelvic muscles for tightness, spasm, pain, or weakness. This may include a rectal exam and a pelvic exam. In some cases, you may have diagnostic tests, such as: Electrical muscle function tests. Urine flow testing. X-ray tests of bowel function. Ultrasound of the pelvic organs. How is this treated? Treatment for this condition depends on the symptoms. Treatment options include: Physical therapy. This may include Kegel exercises to help relax or strengthen the pelvic floor muscles. Biofeedback. This type of therapy provides feedback on how tight your pelvic floor muscles are so that you can learn to control them. Internal or external massage therapy. A treatment that involves electrical stimulation of the pelvic floor muscles to help control pain (transcutaneous electrical nerve stimulation, or TENS). Sound wave therapy (ultrasound) to reduce muscle spasms. Medicines, such as: Muscle relaxants. Bladder control medicines. Surgery to reconstruct or support pelvic floor muscles may be an option if other treatments do not help. Follow these instructions at home: Activity Do your usual activities as told by your health care provider. Ask your health care provider if you should modify any activities. Do pelvic floor strengthening or relaxing exercises at home as told by your physical therapist. Lifestyle Maintain a healthy weight. Eat foods that are high in fiber, such as beans, whole grains, and fresh fruits and vegetables. Limit foods that are high in fat and processed sugars, such as fried or sweet  foods. Manage stress with relaxation techniques such as yoga or meditation. General instructions If you have problems with leakage: Use absorbable pads or wear padded underwear. Wash frequently with mild soap. Keep your genital and anal area as clean and dry as possible. Ask your health care provider if you should try a barrier cream to prevent skin irritation. Take warm baths to relieve pelvic muscle tension or spasms. Take over-the-counter and prescription medicines only as told by your health care provider. Keep all follow-up visits. How is this prevented? The cause of PFD is not always known, but there are a few things you can do to reduce the risk of developing this condition, including: Staying at a healthy weight. Getting regular exercise. Managing stress. Contact a health care provider if: Your symptoms are not improving with home care. You have signs or symptoms of PFD that get worse at home. You develop new signs or symptoms. You have signs of a urinary tract infection, such as: Fever. Chills. Increased urinary frequency. A burning feeling when urinating. You have not had a bowel movement in 3 days (constipation). Summary Pelvic floor dysfunction results when the muscles and connective tissues in your pelvic floor do not work well. These muscles and their connections form  a sling that supports your colon and bladder. In women, they also support the uterus. PFD may be caused by an injury to the pelvic area or by a weakening of pelvic muscles. PFD causes pelvic floor muscles to be too weak, too tight, or a combination of both. Symptoms may vary from person to person. In most cases, PFD can be treated with physical therapies and medicines. Surgery may be an option if other treatments do not help. This information is not intended to replace advice given to you by your health care provider. Make sure you discuss any questions you have with your health care provider. Document  Revised: 07/16/2020 Document Reviewed: 07/16/2020 Elsevier Patient Education  2022 [employer].   I appreciate the  opportunity to care for you  Thank You   [facility]

## 2022-04-24 NOTE — Progress Notes (Signed)
Agree with assessment/plan.  Raj Janiyah Beery, MD [facility] [phone]  

## 2022-04-30 ENCOUNTER — Other Ambulatory Visit (HOSPITAL_COMMUNITY): Payer: Self-pay

## 2022-04-30 ENCOUNTER — Telehealth: Payer: Self-pay | Admitting: Pharmacy Technician

## 2022-04-30 NOTE — Telephone Encounter (Signed)
Patient Advocate Encounter  Prior Authorization for [name] has been approved.        Received notification from [facility] that prior authorization for [name] is required.   PA submitted on 2.9.24 Key [id number] Status is pending

## 2022-05-06 ENCOUNTER — Other Ambulatory Visit: Payer: Self-pay | Admitting: Gastroenterology

## 2022-05-06 DIAGNOSIS — R1012 Left upper quadrant pain: Secondary | ICD-10-CM

## 2022-05-06 DIAGNOSIS — R1013 Epigastric pain: Secondary | ICD-10-CM

## 2022-05-06 DIAGNOSIS — R7401 Elevation of levels of liver transaminase levels: Secondary | ICD-10-CM

## 2022-06-07 ENCOUNTER — Telehealth: Payer: Self-pay | Admitting: Cardiology

## 2022-06-07 ENCOUNTER — Other Ambulatory Visit: Payer: Self-pay

## 2022-06-07 DIAGNOSIS — I493 Ventricular premature depolarization: Secondary | ICD-10-CM

## 2022-06-07 NOTE — Telephone Encounter (Signed)
Urgent referral was placed.

## 2022-06-07 NOTE — Telephone Encounter (Signed)
STAT if HR is under 50 or over 120 (normal HR is 60-100 beats per minute)  What is your heart rate?  Low heart  rate- it is 60, at night sometimes it is low as 58,   Do you have a log of your heart rate readings (document readings)?   Do you have any other symptoms? Having irregular hear beats and a little short of breath- patient would like to be seen asap, first available appointment was 06-28-22

## 2022-06-07 NOTE — Telephone Encounter (Signed)
Called patient, advised that she was having some issues with SOB and feeling of palpitations like her heart is out of rhythm. She does have PVC's, patient is taking Metoprolol 25 mg daily. Patient states her BP today was 142/77 HR 60 (but she does not know if this is accurate). She is SOB on the phone with talking. She has this issue with and without activity. Patient aware we had an opening on Wednesday with the NP who seen her before. Patient states is suppose to go out of town on Wednesday but she will try and move that so we can get her seen.   Will send to NP to make aware.

## 2022-06-09 ENCOUNTER — Ambulatory Visit: Payer: PPO | Admitting: Nurse Practitioner

## 2022-06-11 ENCOUNTER — Ambulatory Visit: Payer: PPO | Admitting: Nurse Practitioner

## 2022-06-27 NOTE — Progress Notes (Unsigned)
Cardiology Clinic Note   Date: 06/28/2022 ID: BETZABETH CAVANESS, DOB [birth year], MRN [id number]  Primary Cardiologist:  Olga Millers, MD  Patient Profile    Andrea Bates is a 77 y.o. female who presents to the clinic today for follow-up.   Past medical history significant for: Palpitations/frequent PVCs. Echo 11/13/2021: EF 60 to 65%.  Mild MR.  Essentially unchanged from prior echo April 2022. 7-day ZIO 11/18/2021: Predominant underlying rhythm was sinus rhythm.  Sinus bradycardia, NSR, sinus tachycardia.  3 beats of NSVT.  1 run of SVT lasting 6 beats, max rate 162 bpm.  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  Frequent couplets (7%) ventricular bigeminy and trigeminy were present. Aortic atherosclerosis. Hyperlipidemia. GERD. Hypothyroidism. Breast cancer.   History of Present Illness    Andrea Bates is followed by Dr. Jens Som for the above outlined history.  Patient was last seen in the office by Bernadene Person, NP on 10/28/2021.  At that time she reported increased palpitations, generalized fatigue, occasional dyspnea.  Echo showed normal LV function essentially unchanged from prior echo April 2022.  7-day ZIO showed frequent PVCs with 18.3% burden.  Patient was referred to EP but it does not appear she was ever seen.  Most recently, patient called into triage on 06/07/2022 with complaints of shortness of breath and palpitations.  Per triage note "Called patient, advised that she was having some issues with SOB and feeling of palpitations like her heart is out of rhythm. She does have PVC's, patient is taking Metoprolol 25 mg daily. Patient states her BP today was 142/77 HR 60 (but she does not know if this is accurate). She is SOB on the phone with talking. She has this issue with and without activity. Patient aware we had an opening on Wednesday with the NP who seen her before. Patient states is suppose to go out of town on Wednesday but she will try and move that so we can get her seen." It was  suggested that she see EP as previously referred. Patient is scheduled with Dr. Ladona Ridgel on 07/06/2022.   Today, patient reports continued palpitations but improved from March 2024. Patient reports continued fatigue and she is concerned it is related to Metoprolol. She reports she recently decreased walking on the treadmill secondary to uterine prolapse.  She cares for her 42-year-old grandson and that keeps her busy.  She does all of her household activities independently.  Patient denies shortness of breath or dyspnea on exertion since mid March. No chest pain, pressure, or tightness. Denies lower extremity edema, orthopnea, or PND.  Patient may need surgery for her uterine prolapse.  She does not know if she will need cardiac clearance.  Patient is active taking care of her 43-year-old grandson.  She can climb stairs without difficulty.  She was walking frequently on a treadmill at home but secondary to the uterine prolapse she has since stopped.   ROS: All other systems reviewed and are otherwise negative except as noted in History of Present Illness.  Studies Reviewed    ECG personally reviewed by me today: Sinus rhythm with occasional PVCs, 70 bpm.  No significant changes from 10/28/2021.   Physical Exam    VS:  BP 128/78 (BP Location: Left Arm, Patient Position: Sitting, Cuff Size: Normal)   Pulse 70   Ht 5\' 2"  (1.575 m)   Wt 111 lb 12.8 oz (50.7 kg)   SpO2 98%   BMI 20.45 kg/m  , BMI Body mass index is 20.45 kg/m.  GEN: Well nourished, well developed, in no acute distress. Neck: No JVD or carotid bruits. Cardiac:  RRR.  Extrasystole.  No murmurs. No rubs or gallops.   Respiratory:  Respirations regular and unlabored. Clear to auscultation without rales, wheezing or rhonchi. GI: Soft, nontender, nondistended. Extremities: Radials/DP/PT 2+ and equal bilaterally. No clubbing or cyanosis. No edema.  Skin: Warm and dry, no rash. Neuro: Strength intact.  Assessment & Plan    Palpitations/frequent PVCs.  7-day ZIO August 2023 showed frequent PVCs 18.3% burden.  Patient reports palpitations have improved since calling into triage in March.  She reports continued fatigue and is concerned it is coming from metoprolol.  Discussed the frequency of PVCs as seen on her ZIO.  Would like her to continue metoprolol for now until she sees Dr. Ladona Ridgel next week.  She states if she wakes up feeling okay she will only take half a tablet of metoprolol but if she is having a lot of palpitations she will take a full 1.  I told her it was fine to continue to do this.  Let her know on the days she takes 1/2 tablet if she feels increased palpitations she can take the other half.  Continue metoprolol as she has been taking it.  Will get CBC, BMP, TSH today.  She is scheduled to see Dr. Ladona Ridgel on 07/06/2022. Preoperative cardiovascular risk assessment. Patient may need surgery for prolapsed uterus. According to the RCRI, patient has a 0.4% risk of MACE. Patient reports activity equivalent to >4.0 METS (Climbing stairs, household duties, caring for 60 year-old grandson). Based on ACC/AHA guidelines, and providing nothing has changed, ADDYSIN HUARD would be at acceptable risk for the planned procedure without further cardiovascular testing.    Disposition: CBC, BMP, TSH today.  Keep scheduled visit with Dr. Ladona Ridgel.  Return in 6 months or sooner as needed.         Signed, Etta Grandchild. Catera Hankins, DNP, NP-C

## 2022-06-28 ENCOUNTER — Ambulatory Visit: Payer: PPO | Attending: Student | Admitting: Student

## 2022-06-28 ENCOUNTER — Encounter: Payer: Self-pay | Admitting: Student

## 2022-06-28 VITALS — BP 128/78 | HR 70 | Ht 62.0 in | Wt 111.8 lb

## 2022-06-28 DIAGNOSIS — Z01810 Encounter for preprocedural cardiovascular examination: Secondary | ICD-10-CM

## 2022-06-28 DIAGNOSIS — Z79899 Other long term (current) drug therapy: Secondary | ICD-10-CM

## 2022-06-28 DIAGNOSIS — I493 Ventricular premature depolarization: Secondary | ICD-10-CM | POA: Diagnosis not present

## 2022-06-28 DIAGNOSIS — R002 Palpitations: Secondary | ICD-10-CM | POA: Diagnosis not present

## 2022-06-28 NOTE — Patient Instructions (Signed)
Medication Instructions:  No changes *If you need a refill on your cardiac medications before your next appointment, please call your pharmacy*   Lab Work: Your physician recommends that you have lab work drawn today: CBC BMET TSH   If you have labs (blood work) drawn today and your tests are completely normal, you will receive your results only by: MyChart Message (if you have MyChart) OR A paper copy in the mail If you have any lab test that is abnormal or we need to change your treatment, we will call you to review the results.   Testing/Procedures: None ordered  Follow-Up: At [facility], you and your health needs are our priority.  As part of our continuing mission to provide you with exceptional heart care, we have created designated Provider Care Teams.  These Care Teams include your primary Cardiologist (physician) and Advanced Practice Providers (APPs -  Physician Assistants and Nurse Practitioners) who all work together to provide you with the care you need, when you need it.  We recommend signing up for the patient portal called "MyChart".  Sign up information is provided on this After Visit Summary.  MyChart is used to connect with patients for Virtual Visits (Telemedicine).  Patients are able to view lab/test results, encounter notes, upcoming appointments, etc.  Non-urgent messages can be sent to your provider as well.   To learn more about what you can do with MyChart, go to [email or web address].    Your next appointment:   6 month(s)  Provider:   Olga Millers, MD

## 2022-06-29 LAB — CBC
Hematocrit: 43.6 % (ref 34.0–46.6)
Hemoglobin: 14.5 g/dL (ref 11.1–15.9)
MCH: 28.9 pg (ref 26.6–33.0)
MCHC: 33.3 g/dL (ref 31.5–35.7)
MCV: 87 fL (ref 79–97)
Platelets: 393 10*3/uL (ref 150–450)
RBC: 5.01 x10E6/uL (ref 3.77–5.28)
RDW: 13.1 % (ref 11.7–15.4)
WBC: 7.9 10*3/uL (ref 3.4–10.8)

## 2022-06-29 LAB — BASIC METABOLIC PANEL
BUN/Creatinine Ratio: 22 (ref 12–28)
BUN: 15 mg/dL (ref 8–27)
CO2: 24 mmol/L (ref 20–29)
Calcium: 10.1 mg/dL (ref 8.7–10.3)
Chloride: 103 mmol/L (ref 96–106)
Creatinine, Ser: 0.69 mg/dL (ref 0.57–1.00)
Glucose: 89 mg/dL (ref 70–99)
Potassium: 5.3 mmol/L — ABNORMAL HIGH (ref 3.5–5.2)
Sodium: 141 mmol/L (ref 134–144)
eGFR: 90 mL/min/{1.73_m2} (ref >59)

## 2022-06-29 LAB — TSH: TSH: 1.59 u[IU]/mL (ref 0.450–4.500)

## 2022-07-06 ENCOUNTER — Ambulatory Visit: Payer: PPO | Admitting: Internal Medicine

## 2022-07-22 ENCOUNTER — Telehealth: Payer: Self-pay | Admitting: Physician Assistant

## 2022-07-22 NOTE — Telephone Encounter (Signed)
Patient is calling wanting to seek advice on what do to about her severe abdominal pain, states it worsens when she eats and gets real bloated like there's a lot of gas in there. Wondering if she should get some more Xifaxan. Please advise

## 2022-07-23 MED ORDER — RIFAXIMIN 550 MG PO TABS: 550.0000 mg | ORAL_TABLET | Freq: Three times a day (TID) | ORAL | 0 refills | Status: AC

## 2022-07-23 NOTE — Telephone Encounter (Signed)
Fine with repeating round of Xifaxan  3 times daily for 2 weeks, [facility] [place]. If insurance does not cover it, can try alternative antibiotic like dicyclomine 100 mg twice daily for 10 days versus getting from [place] pharmacy.

## 2022-07-23 NOTE — Telephone Encounter (Signed)
Patient is calling back to follow up on message below .Marland Kitchen Please advise ,Thanks

## 2022-07-23 NOTE — Telephone Encounter (Signed)
Patient is returning your call.  

## 2022-07-23 NOTE — Telephone Encounter (Signed)
Lm on vm for patient to return call 

## 2022-07-26 NOTE — Telephone Encounter (Signed)
Called and spoke with patient. I informed her that RX for [facility]. Pt has been advised to let us know if she is not able to get the prescription and we will send in alternative. Pt verbalized understanding and had no concerns at the end of the call.

## 2022-07-27 ENCOUNTER — Telehealth: Payer: Self-pay | Admitting: Physician Assistant

## 2022-07-27 NOTE — Telephone Encounter (Signed)
Andrea Bates from [facility] called asking for clinical information for the PA on file to be faxed Case [phone] Please [phone].

## 2022-07-27 NOTE — Telephone Encounter (Signed)
[employer] calling, requesting prior authorization for Xifaxin.

## 2022-07-28 ENCOUNTER — Telehealth: Payer: Self-pay | Admitting: Physician Assistant

## 2022-07-28 ENCOUNTER — Telehealth: Payer: Self-pay

## 2022-07-28 NOTE — Telephone Encounter (Signed)
Patient called requesting to speak with a nurse regarding her bm's due to bladder uterus prolapse. Please advise, thank you.

## 2022-07-28 NOTE — Telephone Encounter (Signed)
Left message for pt to call back  °

## 2022-07-28 NOTE — Telephone Encounter (Signed)
Pt calling wanting to know if her bladder prolapse could be causing issues with her bowels not working as they normally did and also if prolapse may be causing the SIBO to continue to be a problem. Please advise.

## 2022-07-28 NOTE — Telephone Encounter (Signed)
Pt called states she needs a PA done for her xifaxan script that was sent in.

## 2022-07-29 NOTE — Telephone Encounter (Signed)
Spoke with pt and she is aware of Andrea Bates's comments. She states  she was waiting on PA for xifaxan.  Has this PA been done? Please advise.

## 2022-07-30 ENCOUNTER — Other Ambulatory Visit (HOSPITAL_COMMUNITY): Payer: Self-pay

## 2022-07-30 NOTE — Telephone Encounter (Signed)
Monchell please see notes below. Pt is calling again to check on status of PA.

## 2022-07-30 NOTE — Telephone Encounter (Signed)
Patient called and said she still has not been approved for the Xifaxan and that insurance needed additional information.  I see where it's been sent for PA.  Have you heard back from them?  Please call the patient and advise status.  Thank you.

## 2022-08-03 ENCOUNTER — Telehealth: Payer: Self-pay

## 2022-08-03 ENCOUNTER — Other Ambulatory Visit (HOSPITAL_COMMUNITY): Payer: Self-pay

## 2022-08-03 NOTE — Telephone Encounter (Signed)
Please see notes below.

## 2022-08-03 NOTE — Telephone Encounter (Signed)
PA denied 05/09, appeal started 05/10. Please see additional encounter for updates/additional information

## 2022-08-03 NOTE — Telephone Encounter (Signed)
PA request received via provider for Xifaxan 550MG  tablets  PA previously approved in Feb, however not picked up due to high co-pay ([facility] document in Media and approval letter)  PA sent to insurance ([employer]) 07/29/2022 and DENIED.  Patient started appeal process 07/30/2022, additional information forms were sent to office but not loaded to patient media/sent.  Insurance has been called and additional information for appeal given over the phone  *notation added in request of patients resistance and multiple allergies to medications  Appeal submitted over the phone with representative and additional chart notes faxed to plan, now pending determination  Case ID: [id number]

## 2022-08-03 NOTE — Telephone Encounter (Signed)
PAP application for [employer] FROM [facility]  has been mailed to pt home. I will fax PCP pages once I receive pt pages  Melanee Spry CPhT Rx Patient Advocate [phone]

## 2022-08-05 ENCOUNTER — Telehealth: Payer: Self-pay | Admitting: Pharmacy Technician

## 2022-08-05 ENCOUNTER — Other Ambulatory Visit (HOSPITAL_COMMUNITY): Payer: Self-pay

## 2022-08-05 NOTE — Telephone Encounter (Signed)
Patient Advocate Encounter  Prior Authorization for [facility] 550MG  has been approved with [employer].    PA# [id number] Effective dates: 5.16.24 through 5.29.24  Per [facility] test claim, copay for 14 days supply is $723.72

## 2022-08-09 NOTE — Telephone Encounter (Signed)
Addressed in other encounter 

## 2022-08-25 ENCOUNTER — Encounter: Payer: Self-pay | Admitting: Cardiology

## 2022-10-04 ENCOUNTER — Ambulatory Visit: Payer: PPO | Admitting: Internal Medicine

## 2022-10-19 ENCOUNTER — Ambulatory Visit: Payer: PPO | Admitting: Gastroenterology

## 2022-10-19 ENCOUNTER — Encounter: Payer: Self-pay | Admitting: Gastroenterology

## 2022-10-19 VITALS — Ht 62.0 in | Wt 111.1 lb

## 2022-10-19 DIAGNOSIS — K581 Irritable bowel syndrome with constipation: Secondary | ICD-10-CM | POA: Diagnosis not present

## 2022-10-19 DIAGNOSIS — R1012 Left upper quadrant pain: Secondary | ICD-10-CM

## 2022-10-19 DIAGNOSIS — K449 Diaphragmatic hernia without obstruction or gangrene: Secondary | ICD-10-CM

## 2022-10-19 DIAGNOSIS — R1013 Epigastric pain: Secondary | ICD-10-CM | POA: Diagnosis not present

## 2022-10-19 DIAGNOSIS — K219 Gastro-esophageal reflux disease without esophagitis: Secondary | ICD-10-CM

## 2022-10-19 DIAGNOSIS — Z86010 Personal history of colonic polyps: Secondary | ICD-10-CM | POA: Diagnosis not present

## 2022-10-19 NOTE — Patient Instructions (Addendum)
_______________________________________________________  If your blood pressure at your visit was 140/90 or greater, please contact your primary care physician to follow up on this.  _______________________________________________________  If you are age 76 or older, your body mass index should be between 23-30. Your Body mass index is 20.33 kg/m. If this is out of the aforementioned range listed, please consider follow up with your Primary Care Provider.  If you are age 51 or younger, your body mass index should be between 19-25. Your Body mass index is 20.33 kg/m. If this is out of the aformentioned range listed, please consider follow up with your Primary Care Provider.   ________________________________________________________  The [place] [facility] providers would like to encourage you to use [facility] to communicate with providers for non-urgent requests or questions.  Due to long hold times on the telephone, sending your provider a message by [facility] may be a faster and more efficient way to get a response.  Please allow 48 business hours for a response.  Please remember that this is for non-urgent requests.  _______________________________________________________  Repeat colonoscopy for 07-2023. Please call 2 months prior to schedule this. A letter will be sent as it gets closer.  Can use Smooth move tea as needed  Please call with any questions or concerns.  Thank you,  Dr. Lynann Bologna

## 2022-10-19 NOTE — Progress Notes (Signed)
IMPRESSION and PLAN:    #1. Epi pain/LUQ pain. Neg Mesenteric doppler 09/2019, Neg EGD, CT AP as below. Nl GES 07/2020  #2. 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 IBS-C with bloating/LUQ pain. 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  Neg CT 07/2015, 03/2020 except for redundant stool filled colon, neg EGD with SB Bx 08/2018 and colon 07/2018 except for TAs. Neg celiac screen and Nl TSH.  #4. H/O tubular adenomas 07/2018. Next due 07/2023.  #5. GERD with small HH.   Plan:  -Smooth-move tea PRN -Reassured pt -FU as needed -She would like to get colon done 07/2023. -FU Dr Ashley Royalty        HPI:    Chief Complaint:   Andrea Bates is a 77 y.o. female  Very anxious For follow-up visit.  Doing better No abdo pain now Dx with bladder/rectal prolapse Dr Ashley Royalty- adv Sx. Pt wants to hold off  Constipation is much better  When she has abdominal pain, bloating, constipation-she will take smooth move tea and feels better.  She has not been able to tolerate MiraLAX as it caused her to have diarrhea.  No melena or hematochezia.  No recent weight loss.  Overall doing much better.     From previous notes: Very much concerned about "vagus nerve"  Underwent CT scan Abdo/pelvis with p.o. and IV contrast 03/26/2020 which was neg.  On review she did have significant stool in the transverse colon and in splenic flexure.  Has constipation with pellet-like stools.  The abdominal pain is not there in the morning when she wakes up.  It is more prominent in the evening hours.  She has been tested positive for SIBO breath test in past.  She was initially treated with rifaximin with good results.  Subsequently did not have much benefit with Flagyl.  She was given another course of rifaximin which did not help much.  Wt Readings from Last 3 Encounters:  10/19/22 111 lb 2 oz (50.4 kg)  06/28/22 111 lb 12.8 oz (50.7 kg)  04/23/22 114 lb (51.7 kg)       Prev GI WU: CT AP 04/2021 with  contrast 1. No acute abdominopelvic findings. 2. Large volume of formed stool throughout the colon suggestive of constipation. 3. Left-sided colonic diverticulosis without findings of acute diverticulitis. 4.  Aortic Atherosclerosis (ICD10-I70.0).  Colon 07/2018 -Two 4 to 6 mm polyps in the ascending colon, removed with a cold snare. Resected and retrieved. -Moderate sigmoid diverticulosis. -Otherwise normal colonoscopy to TI. The colon was highly redundant.  EGD 07/2018 Small HH, gastric fundic gland polyp.  Neg SB Bx for celiac, neg gastric Bx for HP.    CT AP 03/26/2020 No CT findings of the abdomen or pelvis to explain abdominal pain. Aortic Atherosclerosis (ICD10-I70.0).  Solid-phase gastric emptying scan 07/2016: neg  UGI series 07/2015: neg  CT 03/2020 03/26/20 No CT findings of the abdomen or pelvis to explain abdominal pain. Aortic Atherosclerosis (ICD10-I70.0).  Wt Readings from Last 3 Encounters:  10/19/22 111 lb 2 oz (50.4 kg)  06/28/22 111 lb 12.8 oz (50.7 kg)  04/23/22 114 lb (51.7 kg)    Past Medical History:  Diagnosis Date   Chest pain    a. negative myoview 2011 [facility]);  b. 04/2010 Cath: normal cors   Colon polyps    Diverticulosis    Female bladder prolapse    GERD (gastroesophageal reflux disease)    a. mild esophagitis on egd 2012   Hemorrhoids  Hiatal hernia    Hyperlipidemia    Hyperlipidemia    Is now taking Fish oil. Had myalgias with Vytorin and Lipitor   Hypothyroidism    Irritable bowel syndrome    Palpitations    a. 04/2008 echo: EF 50-60%;  b. 21 day event monitor 2012 - occasional sinus tachycardia with rates 110-120's. Rare PAC's/PVC's. Serveral short 4-5 beat runs of a-tach. c. 06/2016: Event monitor showing PAC's, PVC's, and brief episodes of PAT.    Personal history of colonic polyps    a. colonoscopy 2009   Personal history of malignant neoplasm of breast    Pre-diabetes    Syncope (VasoVagal Faint) 04/27/2010    Current  Outpatient Medications  Medication Sig Dispense Refill   Cholecalciferol (VITAMIN D3) 25 MCG (1000 UT) CAPS Take 2,000 Int'l Units by mouth daily.     Coenzyme Q10 (COQ10) 100 MG CAPS Take 100 mg by mouth daily. Reported on 07/18/2015     Digestive Enzymes (DIGESTIVE ENZYME ULTRA PO) Take 1 tablet by mouth daily.     fish oil-omega-3 fatty acids 1000 MG capsule Take 1 g by mouth daily. Reported on 07/18/2015     Magnesium 300 MG CAPS Take 1 capsule by mouth daily.     metoprolol succinate (TOPROL XL) 25 MG 24 hr tablet Take 1 tablet (25 mg total) by mouth daily. 90 tablet 3   mometasone (NASONEX) 50 MCG/ACT nasal spray Place 2 sprays into the nose daily.     POTASSIUM CHLORIDE PO Take 90 mcg by mouth daily.     Simethicone 250 MG CAPS Take 1 capsule by mouth daily.     SYNTHROID 50 MCG tablet TAKE 1 TABLET DAILY ON AN EMPTY STOMACH WITH ONLY WATER FOR 30 MINUTES & NO ANTACID MEDS, CALCIUM OR MAGNESIUM FOR 4 HOURS & AVOID BIOTIN 90 tablet 1   No current facility-administered medications for this visit.    Past Surgical History:  Procedure Laterality Date   BUNIONECTOMY     CARPAL TUNNEL RELEASE     bilateral    COLONOSCOPY  07/04/2013   Polyp, Polypectomy. Moderate diverticulosis in L colon. BX: Tubular Adenoma no dyspasia or malignancy. 5 yr rpt colon   NASAL SINUS SURGERY     TONSILLECTOMY     UPPER GASTROINTESTINAL ENDOSCOPY  08/09/2016   normal EGD   UPPER GASTROINTESTINAL ENDOSCOPY  07/04/2013   normal EGD    Family History  Problem Relation Age of Onset   Heart defect Father    Diabetes Paternal Grandmother    Breast cancer Paternal Aunt    Stomach cancer Paternal Aunt    Colon cancer Other        Paternal Great Unlce    Esophageal cancer Neg Hx    Pancreatic cancer Neg Hx     Social History   Tobacco Use   Smoking status: Never   Smokeless tobacco: Never  Vaping Use   Vaping status: Never Used  Substance Use Topics   Alcohol use: No   Drug use: No     Allergies  Allergen Reactions   Iodine Palpitations    NOT CT CONTRAST.    Shrimp Extract     Other Reaction(s): GI Intolerance   Armour Thyroid [Thyroid]     Palpitations    Atenolol     presyncope   Celexa [Citalopram Hydrobromide]     Severe chest tightness    Diltiazem Hcl     Dropped bp   Levaquin [Levofloxacin In D5w] Nausea  And Vomiting   Lipitor [Atorvastatin]     Myalgias    Propranolol Hcl     Felt bad   Vytorin [Ezetimibe-Simvastatin]     Myalgias      Review of Systems: All systems reviewed and negative except where noted in HPI.    Physical Exam:     Ht 5\' 2"  (1.575 m)   Wt 111 lb 2 oz (50.4 kg)   BMI 20.33 kg/m  HEENT: No jaundice, no pallor Respiratory: Bilateral clear. CVS: S1-S2 normal no S3 or S4. Abdo: Soft nontender bowel sounds are present no definite hepatosplenomegaly.      Latest Ref Rng & Units 06/28/2022   12:19 PM 03/24/2020   11:20 AM 11/29/2018   12:38 PM  CBC  WBC 3.4 - 10.8 x10E3/uL 7.9  7.7  7.0   Hemoglobin 11.1 - 15.9 g/dL 44.0  10.2  72.5   Hematocrit 34.0 - 46.6 % 43.6  40.2  39.2   Platelets 150 - 450 x10E3/uL 393  359.0  350       Latest Ref Rng & Units 06/28/2022   12:19 PM 05/06/2021   10:22 AM 03/24/2020   11:20 AM  CMP  Glucose 70 - 99 mg/dL 89  87  88   BUN 8 - 27 mg/dL 15  19  19    Creatinine 0.57 - 1.00 mg/dL 3.66  4.40  3.47   Sodium 134 - 144 mmol/L 141  138  138   Potassium 3.5 - 5.2 mmol/L 5.3  4.4  4.7   Chloride 96 - 106 mmol/L 103  103  102   CO2 20 - 29 mmol/L 24  31  28    Calcium 8.7 - 10.3 mg/dL 42.5  9.8  9.9   Total Protein 6.0 - 8.3 g/dL  7.1  7.1   Total Bilirubin 0.2 - 1.2 mg/dL  0.4  0.3   Alkaline Phos 39 - 117 U/L  123  127   AST 0 - 37 U/L  28  24   ALT 0 - 35 U/L  55  35         Pier Bosher,MD 10/19/2022, 2:47 PM   CC Hortencia Conradi, NP

## 2023-07-12 ENCOUNTER — Ambulatory Visit: Payer: PPO | Admitting: Gastroenterology

## 2023-07-12 VITALS — BP 130/80 | HR 76 | Ht 61.0 in | Wt 111.0 lb

## 2023-07-12 DIAGNOSIS — R1013 Epigastric pain: Secondary | ICD-10-CM | POA: Diagnosis not present

## 2023-07-12 DIAGNOSIS — Z8719 Personal history of other diseases of the digestive system: Secondary | ICD-10-CM

## 2023-07-12 DIAGNOSIS — K581 Irritable bowel syndrome with constipation: Secondary | ICD-10-CM

## 2023-07-12 DIAGNOSIS — K449 Diaphragmatic hernia without obstruction or gangrene: Secondary | ICD-10-CM

## 2023-07-12 DIAGNOSIS — R1012 Left upper quadrant pain: Secondary | ICD-10-CM | POA: Diagnosis not present

## 2023-07-12 DIAGNOSIS — Z860100 Personal history of colon polyps, unspecified: Secondary | ICD-10-CM

## 2023-07-12 DIAGNOSIS — K219 Gastro-esophageal reflux disease without esophagitis: Secondary | ICD-10-CM

## 2023-07-12 DIAGNOSIS — Z860101 Personal history of adenomatous and serrated colon polyps: Secondary | ICD-10-CM

## 2023-07-12 NOTE — Progress Notes (Signed)
 IMPRESSION and PLAN:    #1. Epi pain/LUQ pain. Neg Mesenteric doppler 09/2019, Neg EGD, CT AP as below. Nl GES 07/2020  #2. 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 IBS-C with bloating/LUQ pain. 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  Neg CT 07/2015, 03/2020 except for redundant stool filled colon, neg EGD with SB Bx 08/2018 and colon 07/2018 except for TAs. Neg celiac screen and Nl TSH.  #4. H/O tubular adenomas 07/2018. Next due 07/2023.  #5. GERD with small HH.   #6. Bladder/vaginal prolapse (followed by Dr Augustus Ledger- urogyn)  Plan:  -EGD/colon with clenpiq (pl give samples) -FU Dr Augustus Ledger therafter        HPI:    Chief Complaint:   Andrea Bates is a 78 y.o. female  Very anxious For follow-up visit. History of Present Illness  Andrea Bates is a 78 year old female with stage 2 bladder and vaginal prolapse who presents for evaluation of gastrointestinal symptoms.  She has experienced worsening bladder prolapse, which now protrudes and causes difficulty in passing stools unless she uses aids. She uses Gas-X and Alka-Seltzer Plus to manage symptoms but continues to experience bloating and pressure.  Over the past few months, she has developed sinus congestion and phlegm in her throat, necessitating frequent throat clearing. She experiences nausea when leaning forward, even on an empty stomach, and feels pressure in the upper abdomen. She has a history of allergies and a small hiatal hernia, which may contribute to these symptoms. Occasionally, she experiences heartburn and burning sensations after consuming certain foods like peppermint.  She has a history of sinus infections, with the last one occurring in November of the previous year, resolving without antibiotics by February. She is cautious about taking antibiotics due to their impact on her intestines.  Her blood pressure has been slightly elevated, with diastolic readings usually around 72 to 74, but sometimes  higher. She drinks lemon water at night, which aids in urine release by morning.  Her diet mainly consists of protein, green beans, greens, salad, and cooked carrots. She avoids broccoli due to bloating and cramping. She experiences bloating after eating and has a history of passing small, pellet-like stools, which improve when she avoids bread. A CT scan in 2023 showed significant stool in the colon.  She takes hot water with lemon at night to aid bowel movements but cannot tolerate Miralax  due to cramping. Bloating and pressure wake her at night, causing her heart to beat harder. She is concerned about the impact of bending and lifting on her symptoms, especially when caring for her great-grandchild.    Doing better No abdo pain now Dx with bladder/rectal prolapse Dr Augustus Ledger- adv Sx. Pt wants to hold off  Constipation is much better  When she has abdominal pain, bloating, constipation-she will take smooth move tea and feels better.  She has not been able to tolerate MiraLAX  as it caused her to have diarrhea.  No melena or hematochezia.  No recent weight loss.  Overall doing much better.  Wt Readings from Last 3 Encounters:  07/12/23 111 lb (50.3 kg)  10/19/22 111 lb 2 oz (50.4 kg)  06/28/22 111 lb 12.8 oz (50.7 kg)      From previous notes: Very much concerned about "vagus nerve"  Underwent CT scan Abdo/pelvis with p.o. and IV contrast 03/26/2020 which was neg.  On review she did have significant stool in the transverse colon and in splenic flexure.  Has constipation with pellet-like stools.  The abdominal pain is not there in the morning when she wakes up.  It is more prominent in the evening hours.  She has been tested positive for SIBO breath test in past.  She was initially treated with rifaximin  with good results.  Subsequently did not have much benefit with Flagyl .  She was given another course of rifaximin  which did not help much.  Wt Readings from Last 3 Encounters:   07/12/23 111 lb (50.3 kg)  10/19/22 111 lb 2 oz (50.4 kg)  06/28/22 111 lb 12.8 oz (50.7 kg)       Prev GI WU: CT AP 04/2021 with contrast 1. No acute abdominopelvic findings. 2. Large volume of formed stool throughout the colon suggestive of constipation. 3. Left-sided colonic diverticulosis without findings of acute diverticulitis. 4.  Aortic Atherosclerosis (ICD10-I70.0).  Colon 07/2018 -Two 4 to 6 mm polyps in the ascending colon, removed with a cold snare. Resected and retrieved. -Moderate sigmoid diverticulosis. -Otherwise normal colonoscopy to TI. The colon was highly redundant.  EGD 07/2018 Small HH, gastric fundic gland polyp.  Neg SB Bx for celiac, neg gastric Bx for HP.    CT AP 03/26/2020 No CT findings of the abdomen or pelvis to explain abdominal pain. Aortic Atherosclerosis (ICD10-I70.0).  Solid-phase gastric emptying scan 07/2016: neg  UGI series 07/2015: neg  CT 03/2020 03/26/20 No CT findings of the abdomen or pelvis to explain abdominal pain. Aortic Atherosclerosis (ICD10-I70.0).  Wt Readings from Last 3 Encounters:  07/12/23 111 lb (50.3 kg)  10/19/22 111 lb 2 oz (50.4 kg)  06/28/22 111 lb 12.8 oz (50.7 kg)    Past Medical History:  Diagnosis Date   Chest pain    a. negative myoview 2011 Theodis Fiscal);  b. 04/2010 Cath: normal cors   Colon polyps    Diverticulosis    Female bladder prolapse    GERD (gastroesophageal reflux disease)    a. mild esophagitis on egd 2012   Hemorrhoids    Hiatal hernia    Hyperlipidemia    Hyperlipidemia    Is now taking Fish oil. Had myalgias with Vytorin and Lipitor   Hypothyroidism    Irritable bowel syndrome    Palpitations    a. 04/2008 echo: EF 50-60%;  b. 21 day event monitor 2012 - occasional sinus tachycardia with rates 110-120's. Rare PAC's/PVC's. Serveral short 4-5 beat runs of a-tach. c. 06/2016: Event monitor showing PAC's, PVC's, and brief episodes of PAT.    Personal history of colonic polyps    a.  colonoscopy 2009   Personal history of malignant neoplasm of breast    Pre-diabetes    Syncope (VasoVagal Faint) 04/27/2010    Current Outpatient Medications  Medication Sig Dispense Refill   Cholecalciferol (VITAMIN D3) 25 MCG (1000 UT) CAPS Take 2,000 Int'l Units by mouth daily.     Coenzyme Q10 (COQ10) 100 MG CAPS Take 100 mg by mouth daily. Reported on 07/18/2015     cyanocobalamin (VITAMIN B12) 500 MCG tablet Take 500 mcg by mouth daily.     Digestive Enzymes (DIGESTIVE ENZYME ULTRA PO) Take 1 tablet by mouth daily.     fish oil-omega-3 fatty acids 1000 MG capsule Take 1 g by mouth daily. Reported on 07/18/2015     Magnesium  300 MG CAPS Take 1 capsule by mouth daily.     metoprolol  succinate (TOPROL  XL) 25 MG 24 hr tablet Take 1 tablet (25 mg total) by mouth daily. 90 tablet 3   mometasone (NASONEX) 50 MCG/ACT nasal spray  Place 2 sprays into the nose daily.     Potassium 99 MG TABS Take by mouth.     POTASSIUM CHLORIDE PO Take 90 mcg by mouth daily.     Simethicone 250 MG CAPS Take 1 capsule by mouth daily.     SYNTHROID  50 MCG tablet TAKE 1 TABLET DAILY ON AN EMPTY STOMACH WITH ONLY WATER FOR 30 MINUTES & NO ANTACID MEDS, CALCIUM OR MAGNESIUM  FOR 4 HOURS & AVOID BIOTIN 90 tablet 1   No current facility-administered medications for this visit.    Past Surgical History:  Procedure Laterality Date   BUNIONECTOMY     CARPAL TUNNEL RELEASE     bilateral    COLONOSCOPY  07/04/2013   Polyp, Polypectomy. Moderate diverticulosis in L colon. BX: Tubular Adenoma no dyspasia or malignancy. 5 yr rpt colon   NASAL SINUS SURGERY     TONSILLECTOMY     UPPER GASTROINTESTINAL ENDOSCOPY  08/09/2016   normal EGD   UPPER GASTROINTESTINAL ENDOSCOPY  07/04/2013   normal EGD    Family History  Problem Relation Age of Onset   Heart defect Father    Diabetes Paternal Grandmother    Breast cancer Paternal Aunt    Stomach cancer Paternal Aunt    Colon cancer Other        Paternal Great Unlce     Esophageal cancer Neg Hx    Pancreatic cancer Neg Hx     Social History   Tobacco Use   Smoking status: Never   Smokeless tobacco: Never  Vaping Use   Vaping status: Never Used  Substance Use Topics   Alcohol use: No   Drug use: No    Allergies  Allergen Reactions   Iodine Palpitations    NOT CT CONTRAST.    Shrimp Extract     Other Reaction(s): GI Intolerance   Armour Thyroid  [Thyroid ]     Palpitations    Atenolol     presyncope   Celexa  [Citalopram  Hydrobromide]     Severe chest tightness    Diltiazem  Hcl     Dropped bp   Levaquin  [Levofloxacin  In D5w] Nausea And Vomiting   Lipitor [Atorvastatin]     Myalgias    Propranolol  Hcl     Felt bad   Vytorin [Ezetimibe -Simvastatin]     Myalgias      Review of Systems: All systems reviewed and negative except where noted in HPI.    Physical Exam:     BP 130/80   Pulse 76   Ht 5\' 1"  (1.549 m)   Wt 111 lb (50.3 kg)   BMI 20.97 kg/m  HEENT: No jaundice, no pallor Respiratory: Bilateral clear. CVS: S1-S2 normal no S3 or S4. Abdo: Soft nontender bowel sounds are present no definite hepatosplenomegaly.      Latest Ref Rng & Units 06/28/2022   12:19 PM 03/24/2020   11:20 AM 11/29/2018   12:38 PM  CBC  WBC 3.4 - 10.8 x10E3/uL 7.9  7.7  7.0   Hemoglobin 11.1 - 15.9 g/dL 40.9  81.1  91.4   Hematocrit 34.0 - 46.6 % 43.6  40.2  39.2   Platelets 150 - 450 x10E3/uL 393  359.0  350       Latest Ref Rng & Units 06/28/2022   12:19 PM 05/06/2021   10:22 AM 03/24/2020   11:20 AM  CMP  Glucose 70 - 99 mg/dL 89  87  88   BUN 8 - 27 mg/dL 15  19  19  Creatinine 0.57 - 1.00 mg/dL 1.61  0.96  0.45   Sodium 134 - 144 mmol/L 141  138  138   Potassium 3.5 - 5.2 mmol/L 5.3  4.4  4.7   Chloride 96 - 106 mmol/L 103  103  102   CO2 20 - 29 mmol/L 24  31  28    Calcium 8.7 - 10.3 mg/dL 40.9  9.8  9.9   Total Protein 6.0 - 8.3 g/dL  7.1  7.1   Total Bilirubin 0.2 - 1.2 mg/dL  0.4  0.3   Alkaline Phos 39 - 117 U/L  123  127    AST 0 - 37 U/L  28  24   ALT 0 - 35 U/L  55  35         Myrtha Tonkovich,MD 07/12/2023, 4:57 PM   CC Verdia Glad, NP

## 2023-07-12 NOTE — Patient Instructions (Addendum)
 _______________________________________________________  If your blood pressure at your visit was 140/90 or greater, please contact your primary care physician to follow up on this. _______________________________________________________  If you are age 77 or older, your body mass index should be between 23-30. Your Body mass index is 20.97 kg/m. If this is out of the aforementioned range listed, please consider follow up with your Primary Care Provider. _______________________________________________________  The [facility] providers would like to encourage you to use MYCHART to communicate with providers for non-urgent requests or questions.  Due to long hold times on the telephone, sending your provider a message by [facility] may be a faster and more efficient way to get a response.  Please allow 48 business hours for a response.  Please remember that this is for non-urgent requests.  _______________________________________________________  Elene Griffes have been scheduled for an endoscopy and colonoscopy. Please follow the written instructions given to you at your visit today.  If you use inhalers (even only as needed), please bring them with you on the day of your procedure.  DO NOT TAKE 7 DAYS PRIOR TO TEST- Trulicity (dulaglutide) Ozempic, Wegovy (semaglutide) Mounjaro (tirzepatide) Bydureon Bcise (exanatide extended release)  DO NOT TAKE 1 DAY PRIOR TO YOUR TEST Rybelsus (semaglutide) Adlyxin (lixisenatide) Victoza (liraglutide) Byetta (exanatide) ___________________________________________________________________________  Due to recent changes in healthcare laws, you may see the results of your imaging and laboratory studies on MyChart before your provider has had a chance to review them.  We understand that in some cases there may be results that are confusing or concerning to you. Not all laboratory results come back in the same time frame and the provider may be waiting for multiple  results in order to interpret others.  Please give us  48 hours in order for your provider to thoroughly review all the results before contacting the office for clarification of your results.   Thank you for entrusting me with your care and choosing [facility].  Dr Venice Gillis

## 2023-08-31 ENCOUNTER — Encounter: Payer: Self-pay | Admitting: Gastroenterology

## 2023-09-05 ENCOUNTER — Telehealth: Payer: Self-pay | Admitting: Gastroenterology

## 2023-09-05 NOTE — Telephone Encounter (Addendum)
 Patient said that she can't do miralax  for her 2 day so I told her if she can do the 3 capsules in 48oz of clear liquids that would be better since she can't handle the miralax  but she said she will try that and if not she will do the clear liquids only but still the dulcolax etc

## 2023-09-05 NOTE — Telephone Encounter (Signed)
 Requesting f/u call in regards to prep instructions. Please advise.

## 2023-09-08 ENCOUNTER — Ambulatory Visit: Admitting: Gastroenterology

## 2023-09-08 ENCOUNTER — Encounter: Payer: Self-pay | Admitting: Gastroenterology

## 2023-09-08 VITALS — BP 114/66 | HR 60 | Temp 97.7°F | Resp 12 | Ht 61.0 in | Wt 109.0 lb

## 2023-09-08 DIAGNOSIS — R1013 Epigastric pain: Secondary | ICD-10-CM

## 2023-09-08 DIAGNOSIS — Z1211 Encounter for screening for malignant neoplasm of colon: Secondary | ICD-10-CM

## 2023-09-08 DIAGNOSIS — D123 Benign neoplasm of transverse colon: Secondary | ICD-10-CM | POA: Diagnosis not present

## 2023-09-08 DIAGNOSIS — Z860100 Personal history of colon polyps, unspecified: Secondary | ICD-10-CM

## 2023-09-08 DIAGNOSIS — K623 Rectal prolapse: Secondary | ICD-10-CM

## 2023-09-08 DIAGNOSIS — K640 First degree hemorrhoids: Secondary | ICD-10-CM

## 2023-09-08 DIAGNOSIS — D122 Benign neoplasm of ascending colon: Secondary | ICD-10-CM

## 2023-09-08 DIAGNOSIS — K449 Diaphragmatic hernia without obstruction or gangrene: Secondary | ICD-10-CM | POA: Diagnosis not present

## 2023-09-08 DIAGNOSIS — K5730 Diverticulosis of large intestine without perforation or abscess without bleeding: Secondary | ICD-10-CM

## 2023-09-08 DIAGNOSIS — K581 Irritable bowel syndrome with constipation: Secondary | ICD-10-CM

## 2023-09-08 MED ORDER — SODIUM CHLORIDE 0.9 % IV SOLN: 500.0000 mL | Freq: Once | INTRAVENOUS | Status: DC

## 2023-09-08 NOTE — Progress Notes (Signed)
 IMPRESSION and PLAN:    #1. Epi pain/LUQ pain. Neg Mesenteric doppler 09/2019, Neg EGD, CT AP as below. Nl GES 07/2020  #2. 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 IBS-C with bloating/LUQ pain. 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  Neg CT 07/2015, 03/2020 except for redundant stool filled colon, neg EGD with SB Bx 08/2018 and colon 07/2018 except for TAs. Neg celiac screen and Nl TSH.  #4. H/O tubular adenomas 07/2018. Next due 07/2023.  #5. GERD with small HH.   #6. Bladder/vaginal prolapse (followed by Dr Augustus Ledger- urogyn)  Plan:  -EGD/colon with clenpiq (pl give samples) -FU Dr Augustus Ledger therafter        HPI:    Chief Complaint:   Andrea Bates is a 78 y.o. female  Very anxious For follow-up visit. History of Present Illness  Andrea Bates is a 78 year old female with stage 2 bladder and vaginal prolapse who presents for evaluation of gastrointestinal symptoms.  She has experienced worsening bladder prolapse, which now protrudes and causes difficulty in passing stools unless she uses aids. She uses Gas-X and Alka-Seltzer Plus to manage symptoms but continues to experience bloating and pressure.  Over the past few months, she has developed sinus congestion and phlegm in her throat, necessitating frequent throat clearing. She experiences nausea when leaning forward, even on an empty stomach, and feels pressure in the upper abdomen. She has a history of allergies and a small hiatal hernia, which may contribute to these symptoms. Occasionally, she experiences heartburn and burning sensations after consuming certain foods like peppermint.  She has a history of sinus infections, with the last one occurring in November of the previous year, resolving without antibiotics by February. She is cautious about taking antibiotics due to their impact on her intestines.  Her blood pressure has been slightly elevated, with diastolic readings usually around 72 to 74, but sometimes  higher. She drinks lemon water at night, which aids in urine release by morning.  Her diet mainly consists of protein, green beans, greens, salad, and cooked carrots. She avoids broccoli due to bloating and cramping. She experiences bloating after eating and has a history of passing small, pellet-like stools, which improve when she avoids bread. A CT scan in 2023 showed significant stool in the colon.  She takes hot water with lemon at night to aid bowel movements but cannot tolerate Miralax  due to cramping. Bloating and pressure wake her at night, causing her heart to beat harder. She is concerned about the impact of bending and lifting on her symptoms, especially when caring for her great-grandchild.    Doing better No abdo pain now Dx with bladder/rectal prolapse Dr Augustus Ledger- adv Sx. Pt wants to hold off  Constipation is much better  When she has abdominal pain, bloating, constipation-she will take smooth move tea and feels better.  She has not been able to tolerate MiraLAX  as it caused her to have diarrhea.  No melena or hematochezia.  No recent weight loss.  Overall doing much better.  Wt Readings from Last 3 Encounters:  09/08/23 109 lb (49.4 kg)  07/12/23 111 lb (50.3 kg)  10/19/22 111 lb 2 oz (50.4 kg)      From previous notes: Very much concerned about vagus nerve  Underwent CT scan Abdo/pelvis with p.o. and IV contrast 03/26/2020 which was neg.  On review she did have significant stool in the transverse colon and in splenic flexure.  Has constipation with pellet-like stools.  The  abdominal pain is not there in the morning when she wakes up.  It is more prominent in the evening hours.  She has been tested positive for SIBO breath test in past.  She was initially treated with rifaximin  with good results.  Subsequently did not have much benefit with Flagyl .  She was given another course of rifaximin  which did not help much.  Wt Readings from Last 3 Encounters:  09/08/23  109 lb (49.4 kg)  07/12/23 111 lb (50.3 kg)  10/19/22 111 lb 2 oz (50.4 kg)       Prev GI WU: CT AP 04/2021 with contrast 1. No acute abdominopelvic findings. 2. Large volume of formed stool throughout the colon suggestive of constipation. 3. Left-sided colonic diverticulosis without findings of acute diverticulitis. 4.  Aortic Atherosclerosis (ICD10-I70.0).  Colon 07/2018 -Two 4 to 6 mm polyps in the ascending colon, removed with a cold snare. Resected and retrieved. -Moderate sigmoid diverticulosis. -Otherwise normal colonoscopy to TI. The colon was highly redundant.  EGD 07/2018 Small HH, gastric fundic gland polyp.  Neg SB Bx for celiac, neg gastric Bx for HP.    CT AP 03/26/2020 No CT findings of the abdomen or pelvis to explain abdominal pain. Aortic Atherosclerosis (ICD10-I70.0).  Solid-phase gastric emptying scan 07/2016: neg  UGI series 07/2015: neg  CT 03/2020 03/26/20 No CT findings of the abdomen or pelvis to explain abdominal pain. Aortic Atherosclerosis (ICD10-I70.0).  Wt Readings from Last 3 Encounters:  09/08/23 109 lb (49.4 kg)  07/12/23 111 lb (50.3 kg)  10/19/22 111 lb 2 oz (50.4 kg)    Past Medical History:  Diagnosis Date   Cataract    Chest pain    a. negative myoview 2011 Theodis Fiscal);  b. 04/2010 Cath: normal cors   Colon polyps    Diverticulosis    Female bladder prolapse    GERD (gastroesophageal reflux disease)    a. mild esophagitis on egd 2012   Hemorrhoids    Hiatal hernia    Hyperlipidemia    Hyperlipidemia    Is now taking Fish oil. Had myalgias with Vytorin and Lipitor   Hypothyroidism    Irritable bowel syndrome    Osteoporosis    Palpitations    a. 04/2008 echo: EF 50-60%;  b. 21 day event monitor 2012 - occasional sinus tachycardia with rates 110-120's. Rare PAC's/PVC's. Serveral short 4-5 beat runs of a-tach. c. 06/2016: Event monitor showing PAC's, PVC's, and brief episodes of PAT.    Personal history of colonic polyps    a.  colonoscopy 2009   Personal history of malignant neoplasm of breast    Pre-diabetes    Syncope (VasoVagal Faint) 04/27/2010    Current Outpatient Medications  Medication Sig Dispense Refill   Cholecalciferol (VITAMIN D3) 25 MCG (1000 UT) CAPS Take 2,000 Int'l Units by mouth daily.     Coenzyme Q10 (COQ10) 100 MG CAPS Take 100 mg by mouth daily. Reported on 07/18/2015     cyanocobalamin (VITAMIN B12) 500 MCG tablet Take 500 mcg by mouth daily.     Magnesium  300 MG CAPS Take 1 capsule by mouth daily.     metoprolol  succinate (TOPROL  XL) 25 MG 24 hr tablet Take 1 tablet (25 mg total) by mouth daily. 90 tablet 3   mometasone (NASONEX) 50 MCG/ACT nasal spray Place 2 sprays into the nose daily.     Potassium 99 MG TABS Take by mouth.     POTASSIUM CHLORIDE PO Take 90 mcg by mouth daily.  Simethicone 250 MG CAPS Take 1 capsule by mouth daily.     SYNTHROID  50 MCG tablet TAKE 1 TABLET DAILY ON AN EMPTY STOMACH WITH ONLY WATER FOR 30 MINUTES & NO ANTACID MEDS, CALCIUM OR MAGNESIUM  FOR 4 HOURS & AVOID BIOTIN 90 tablet 1   Digestive Enzymes (DIGESTIVE ENZYME ULTRA PO) Take 1 tablet by mouth daily.     fish oil-omega-3 fatty acids 1000 MG capsule Take 1 g by mouth daily. Reported on 07/18/2015     Current Facility-Administered Medications  Medication Dose Route Frequency Provider Last Rate Last Admin   0.9 %  sodium chloride  infusion  500 mL Intravenous Once Lajuan Pila, MD        Past Surgical History:  Procedure Laterality Date   BUNIONECTOMY     CARPAL TUNNEL RELEASE     bilateral    COLONOSCOPY  07/04/2013   Polyp, Polypectomy. Moderate diverticulosis in L colon. BX: Tubular Adenoma no dyspasia or malignancy. 5 yr rpt colon   NASAL SINUS SURGERY     TONSILLECTOMY     UPPER GASTROINTESTINAL ENDOSCOPY  08/09/2016   normal EGD   UPPER GASTROINTESTINAL ENDOSCOPY  07/04/2013   normal EGD    Family History  Problem Relation Age of Onset   Heart defect Father    Diabetes Paternal  Grandmother    Breast cancer Paternal Aunt    Stomach cancer Paternal Aunt    Colon cancer Other        Paternal Great Unlce    Esophageal cancer Neg Hx    Pancreatic cancer Neg Hx     Social History   Tobacco Use   Smoking status: Never   Smokeless tobacco: Never  Vaping Use   Vaping status: Never Used  Substance Use Topics   Alcohol use: No   Drug use: No    Allergies  Allergen Reactions   Atenolol Other (See Comments)    presyncope   Celexa  [Citalopram  Hydrobromide] Other (See Comments)    Severe chest tightness    Diltiazem  Hcl Other (See Comments)    Dropped bp   Levaquin  [Levofloxacin  In D5w] Nausea And Vomiting   Lipitor [Atorvastatin] Other (See Comments)    Myalgias    Vytorin [Ezetimibe -Simvastatin] Other (See Comments)    Myalgias    Armour Thyroid  [Thyroid ] Palpitations    Palpitations    Iodine Palpitations    NOT CT CONTRAST.    Propranolol  Hcl Other (See Comments)    Felt bad   Shrimp Extract Other (See Comments)    Other Reaction(s): GI Intolerance     Review of Systems: All systems reviewed and negative except where noted in HPI.    Physical Exam:     BP (!) 150/83 (BP Location: Right Arm, Patient Position: Sitting, Cuff Size: Normal)   Pulse 75   Temp 97.7 F (36.5 C) (Temporal)   Ht 5' 1 (1.549 m)   Wt 109 lb (49.4 kg)   SpO2 99%   BMI 20.60 kg/m  HEENT: No jaundice, no pallor Respiratory: Bilateral clear. CVS: S1-S2 normal no S3 or S4. Abdo: Soft nontender bowel sounds are present no definite hepatosplenomegaly.      Latest Ref Rng & Units 06/28/2022   12:19 PM 03/24/2020   11:20 AM 11/29/2018   12:38 PM  CBC  WBC 3.4 - 10.8 x10E3/uL 7.9  7.7  7.0   Hemoglobin 11.1 - 15.9 g/dL 91.4  78.2  95.6   Hematocrit 34.0 - 46.6 % 43.6  40.2  39.2   Platelets 150 - 450 x10E3/uL 393  359.0  350       Latest Ref Rng & Units 06/28/2022   12:19 PM 05/06/2021   10:22 AM 03/24/2020   11:20 AM  CMP  Glucose 70 - 99 mg/dL 89  87  88    BUN 8 - 27 mg/dL 15  19  19    Creatinine 0.57 - 1.00 mg/dL 8.41  3.24  4.01   Sodium 134 - 144 mmol/L 141  138  138   Potassium 3.5 - 5.2 mmol/L 5.3  4.4  4.7   Chloride 96 - 106 mmol/L 103  103  102   CO2 20 - 29 mmol/L 24  31  28    Calcium 8.7 - 10.3 mg/dL 02.7  9.8  9.9   Total Protein 6.0 - 8.3 g/dL  7.1  7.1   Total Bilirubin 0.2 - 1.2 mg/dL  0.4  0.3   Alkaline Phos 39 - 117 U/L  123  127   AST 0 - 37 U/L  28  24   ALT 0 - 35 U/L  55  35         Mackinley Cassaday,MD 09/08/2023, 10:09 AM   CC Verdia Glad, NP

## 2023-09-08 NOTE — Progress Notes (Signed)
 Vitals-DT  Pt's states no medical or surgical changes since previsit or office visit.

## 2023-09-08 NOTE — Op Note (Signed)
  [facility] Patient Name: Andrea Bates Procedure Date: 09/08/2023 10:09 AM MRN: [id number] Endoscopist: Lajuan Pila , MD, [id number] Age: 78 Referring MD:  Date of Birth: 1945/10/11 Gender: Female Account #: [id number] Procedure:                Upper GI endoscopy Indications:              Epigastric abdominal pain Medicines:                Monitored Anesthesia Care Procedure:                Pre-Anesthesia Assessment:                           - Prior to the procedure, a History and Physical                            was performed, and patient medications and                            allergies were reviewed. The patient's tolerance of                            previous anesthesia was also reviewed. The risks                            and benefits of the procedure and the sedation                            options and risks were discussed with the patient.                            All questions were answered, and informed consent                            was obtained. Prior Anticoagulants: The patient has                            taken no anticoagulant or antiplatelet agents. ASA                            Grade Assessment: II - A patient with mild systemic                            disease. After reviewing the risks and benefits,                            the patient was deemed in satisfactory condition to                            undergo the procedure.                           After obtaining informed consent, the endoscope was  passed under direct vision. Throughout the                            procedure, the patient's blood pressure, pulse, and                            oxygen saturations were monitored continuously. The                            Olympus Scope [id number] was introduced through the                            mouth, and advanced to the second part of duodenum.                            The upper GI  endoscopy was accomplished without                            difficulty. The patient tolerated the procedure                            well. Scope In: Scope Out: Findings:                 The examined esophagus was normal.                           The Z-line was regular and was found 38 cm from the                            incisors.                           A 2 cm hiatal hernia was present.                           The entire examined stomach was normal.                           The examined duodenum was normal. Complications:            No immediate complications. Estimated Blood Loss:     Estimated blood loss: none. Impression:               - Small hiatal hernia.                           - Otherwise normal EGD. Recommendation:           - Patient has a contact number available for                            emergencies. The signs and symptoms of potential                            delayed complications were discussed with the  patient. Return to normal activities tomorrow.                            Written discharge instructions were provided to the                            patient.                           - Resume previous diet.                           - Continue present medications.                           - Proceed with colonoscopy.                           - The findings and recommendations were discussed                            with the patient's family. Lajuan Pila, MD 09/08/2023 10:58:20 AM This report has been signed electronically.

## 2023-09-08 NOTE — Patient Instructions (Addendum)
 NO ASPIRIN, ASPIRIN CONTAINING PRODUCTS (BC OR GOODY POWDERS) OR NSAIDS (IBUPROFEN, ADVIL, ALEVE, AND MOTRIN) FOR 5 days; TYLENOL IS OK TO TAKE  YOU HAD AN ENDOSCOPIC PROCEDURE TODAY AT THE [facility]:   Refer to the procedure report that was given to you for any specific questions about what was found during the examination.  If the procedure report does not answer your questions, please call your gastroenterologist to clarify.  If you requested that your care partner not be given the details of your procedure findings, then the procedure report has been included in a sealed envelope for you to review at your convenience later.  YOU SHOULD EXPECT: Some feelings of bloating in the abdomen. Passage of more gas than usual.  Walking can help get rid of the air that was put into your GI tract during the procedure and reduce the bloating. If you had a lower endoscopy (such as a colonoscopy or flexible sigmoidoscopy) you may notice spotting of blood in your stool or on the toilet paper. If you underwent a bowel prep for your procedure, you may not have a normal bowel movement for a few days.  Please Note:  You might notice some irritation and congestion in your nose or some drainage.  This is from the oxygen used during your procedure.  There is no need for concern and it should clear up in a day or so.  SYMPTOMS TO REPORT IMMEDIATELY:  Following lower endoscopy (colonoscopy or flexible sigmoidoscopy):  Excessive amounts of blood in the stool  Significant tenderness or worsening of abdominal pains  Swelling of the abdomen that is new, acute  Fever of 100F or higher  Following upper endoscopy (EGD)  Vomiting of blood or coffee ground material  New chest pain or pain under the shoulder blades  Painful or persistently difficult swallowing  New shortness of breath  Fever of 100F or higher  Black, tarry-looking stools  For urgent or emergent issues, a gastroenterologist can be reached at  any hour by calling [phone]. Do not use MyChart messaging for urgent concerns.    DIET:  We do recommend a small meal at first, but then you may proceed to your regular diet.  Drink plenty of fluids but you should avoid alcoholic beverages for 24 hours.  ACTIVITY:  You should plan to take it easy for the rest of today and you should NOT DRIVE or use heavy machinery until tomorrow (because of the sedation medicines used during the test).    FOLLOW UP: Our staff will call the number listed on your records the next business day following your procedure.  We will call around 7:15- 8:00 am to check on you and address any questions or concerns that you may have regarding the information given to you following your procedure. If we do not reach you, we will leave a message.     If any biopsies were taken you will be contacted by phone or by letter within the next 1-3 weeks.  Please call us  at [phone] if you have not heard about the biopsies in 3 weeks.    SIGNATURES/CONFIDENTIALITY: You and/or your care partner have signed paperwork which will be entered into your electronic medical record.  These signatures attest to the fact that that the information above on your After Visit Summary has been reviewed and is understood.  Full responsibility of the confidentiality of this discharge information lies with you and/or your care-partner.

## 2023-09-08 NOTE — Op Note (Signed)
 [facility] Patient Name: Andrea Bates Procedure Date: 09/08/2023 9:56 AM MRN: [id number] Endoscopist: Lajuan Pila , MD, [id number] Age: 78 Referring MD:  Date of Birth: Jan 08, 1946 Gender: Female Account #: [id number] Procedure:                Colonoscopy Indications:              High risk colon cancer surveillance: Personal                            history of colonic polyps. IBS-C Medicines:                Monitored Anesthesia Care Procedure:                Pre-Anesthesia Assessment:                           - Prior to the procedure, a History and Physical                            was performed, and patient medications and                            allergies were reviewed. The patient's tolerance of                            previous anesthesia was also reviewed. The risks                            and benefits of the procedure and the sedation                            options and risks were discussed with the patient.                            All questions were answered, and informed consent                            was obtained. Prior Anticoagulants: The patient has                            taken no anticoagulant or antiplatelet agents. ASA                            Grade Assessment: II - A patient with mild systemic                            disease. After reviewing the risks and benefits,                            the patient was deemed in satisfactory condition to                            undergo the procedure.  After obtaining informed consent, the colonoscope                            was passed under direct vision. Throughout the                            procedure, the patient's blood pressure, pulse, and                            oxygen saturations were monitored continuously. The                            Olympus Scope PCF SN [id number] was introduced through                            the anus and advanced to  the the cecum, identified                            by appendiceal orifice and ileocecal valve. The                            colonoscopy was performed without difficulty. The                            patient tolerated the procedure well. The quality                            of the bowel preparation was adequate to identify                            polyps. Adherent stool especially in the right side                            of the colon (despite 2-day prep-patient did not                            tolerate the entire prep). Nevertheless, aggressive                            suctioning and aspiration was performed. Overall                            the examination was adequate. The ileocecal valve,                            appendiceal orifice, and rectum were photographed. Scope In: 10:25:49 AM Scope Out: 10:52:54 AM Scope Withdrawal Time: 0 hours 21 minutes 57 seconds  Total Procedure Duration: 0 hours 27 minutes 5 seconds  Findings:                 A 4 mm polyp was found in the proximal ascending  colon. The polyp was sessile. The polyp was removed                            with a cold snare. Resection and retrieval were                            complete.                           A 12 mm polyp was found in the mid transverse                            colon. The polyp was sessile. The polyp was removed                            with a piecemeal technique using a cold snare.                            Resection and retrieval were complete. Estimated                            blood loss: none.                           Multiple medium-mouthed diverticula were found in                            the sigmoid colon and descending colon.                           Non-bleeding internal hemorrhoids were found during                            retroflexion. The hemorrhoids were small and Grade                            I (internal hemorrhoids that  do not prolapse).                            Grade 1 rectal prolapse was also noted.                           The exam was otherwise without abnormality on                            direct and retroflexion views. Complications:            No immediate complications. Estimated Blood Loss:     Estimated blood loss: none. Impression:               - One 4 mm polyp in the proximal ascending colon,                            removed with a cold snare. Resected and retrieved.                           -  One 12 mm polyp in the mid transverse colon,                            removed piecemeal using a cold snare. Resected and                            retrieved.                           - Diverticulosis in the sigmoid colon and in the                            descending colon.                           - Non-bleeding internal hemorrhoids.                           - Grade 1 rectal prolapse.                           - The examination was otherwise normal on direct                            and retroflexion views. Recommendation:           - Patient has a contact number available for                            emergencies. The signs and symptoms of potential                            delayed complications were discussed with the                            patient. Return to normal activities tomorrow.                            Written discharge instructions were provided to the                            patient.                           - Resume previous diet.                           - Continue present medications.                           - Await pathology results.                           - Repeat colonoscopy for surveillance based on                            pathology results.                           -  No aspirin, ibuprofen, naproxen, or other                            non-steroidal anti-inflammatory drugs for 5 days                            after polyp removal.                            - FU with Dr. Augustus Ledger (urogynecology)                           - The findings and recommendations were discussed                            with the patient's family. Lajuan Pila, MD 09/08/2023 11:05:22 AM This report has been signed electronically.

## 2023-09-08 NOTE — Progress Notes (Signed)
 To pacu, VSS. Report to [email or web address]

## 2023-09-09 ENCOUNTER — Telehealth: Payer: Self-pay | Admitting: *Deleted

## 2023-09-09 NOTE — Telephone Encounter (Signed)
  Follow up Call-     09/08/2023    9:26 AM 09/08/2023    9:18 AM  Call back number  Post procedure Call Back phone  # [phone]   Permission to leave phone message  Yes     Patient questions:  Do you have a fever, pain , or abdominal swelling? No. Pain Score  0 *  Have you tolerated food without any problems? Yes.    Have you been able to return to your normal activities? Yes.    Do you have any questions about your discharge instructions: Diet   No. Medications  No. Follow up visit  No.  Do you have questions or concerns about your Care? No.  Actions: * If pain score is 4 or above: No action needed, pain <4.

## 2023-09-12 LAB — SURGICAL PATHOLOGY

## 2023-09-22 ENCOUNTER — Ambulatory Visit: Payer: Self-pay | Admitting: Gastroenterology

## 2023-09-22 ENCOUNTER — Telehealth: Payer: Self-pay | Admitting: Gastroenterology

## 2023-09-22 NOTE — Telephone Encounter (Signed)
 PT is calling to have a nurse discuss her pathology reports from 6/19. Please advise.

## 2023-09-26 NOTE — Telephone Encounter (Signed)
 Pt was calling to discuss her recent  pathology report.  Pt was made aware of the letter that was created by Dr. Charlanne and sent to the pt via my chart. Letter was read to the pt. Pt verbalized understanding with all questions answered.

## 2023-12-19 ENCOUNTER — Telehealth: Payer: Self-pay | Admitting: Gastroenterology

## 2023-12-19 NOTE — Telephone Encounter (Signed)
 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 Pt stated that she has been treated for this several times over the last several years. Pt stated that she has not been tested recently but she knows the symptoms. ( Bloating ) Pt stated that Dr. Charlanne was aware of her situation. Pt was offered an office visit for this Friday with Alan Coombs PA. Pt stated that she will call back next Monday when Dr. Charlanne returns. Pt verbalized understanding with all questions answered.

## 2023-12-19 NOTE — Telephone Encounter (Signed)
 Inbound call from patient requesting a call back to discuss if she can get samples of Xifaxan . Please advise.

## 2024-01-04 ENCOUNTER — Telehealth: Payer: Self-pay | Admitting: Gastroenterology

## 2024-01-04 NOTE — Telephone Encounter (Signed)
 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 Please advise.

## 2024-01-04 NOTE — Telephone Encounter (Signed)
 Spoke to pt. Documented in alternate phone note and sent to Dr. Charlanne

## 2024-01-04 NOTE — Telephone Encounter (Signed)
 Please see notes below Pt called today and stated that she is still having the same symptoms.  Please review and advise

## 2024-01-24 NOTE — Telephone Encounter (Signed)
 Lets do rifaximin  550 mg p.o. 3 times daily x 10 days Can we arrange samples? RG

## 2024-01-26 NOTE — Telephone Encounter (Signed)
 Spoke with this patient & at this time she would like to hold off on sending prescription in d/t the expense. She would like to put on a wait list for the samples if possible.

## 2024-01-26 NOTE — Telephone Encounter (Signed)
 Theres one in front of her so it may be a couple of weeks.  But I'll definitely put her on the list

## 2024-01-26 NOTE — Telephone Encounter (Signed)
 Left message for patient to call back

## 2024-02-03 ENCOUNTER — Telehealth: Payer: Self-pay

## 2024-02-03 NOTE — Telephone Encounter (Signed)
 Received samples of Xifaxan  patient has been waiting for.  Lm on vm for her to call me back so I can tell her to come pick them up.

## 2024-02-03 NOTE — Telephone Encounter (Signed)
 Advised patient samples were received. States she will be in Monday to pick them up. Please advise, thank you

## 2024-02-06 NOTE — Telephone Encounter (Signed)
 Left samples on the 3rd floor for patient to pick up.

## 2024-02-06 NOTE — Telephone Encounter (Signed)
 Pt called back to let us  know she got the message about Xifaxan  samples.

## 2024-02-09 NOTE — Telephone Encounter (Signed)
 Per Sonny, CMA - xifaxan  samples have been given to patient.

## 2024-02-10 ENCOUNTER — Ambulatory Visit: Admitting: Gastroenterology

## 2024-02-10 ENCOUNTER — Encounter: Payer: Self-pay | Admitting: Gastroenterology

## 2024-02-10 VITALS — BP 122/78 | HR 77 | Ht 61.0 in | Wt 113.0 lb

## 2024-02-10 DIAGNOSIS — K638219 Small intestinal bacterial overgrowth, unspecified: Secondary | ICD-10-CM | POA: Diagnosis not present

## 2024-02-10 DIAGNOSIS — K581 Irritable bowel syndrome with constipation: Secondary | ICD-10-CM

## 2024-02-10 DIAGNOSIS — K219 Gastro-esophageal reflux disease without esophagitis: Secondary | ICD-10-CM

## 2024-02-10 DIAGNOSIS — K449 Diaphragmatic hernia without obstruction or gangrene: Secondary | ICD-10-CM

## 2024-02-10 MED ORDER — OMEPRAZOLE 20 MG PO CPDR: 20.0000 mg | DELAYED_RELEASE_CAPSULE | Freq: Every day | ORAL | 0 refills | Status: AC

## 2024-02-10 NOTE — Patient Instructions (Addendum)
 _______________________________________________________  If your blood pressure at your visit was 140/90 or greater, please contact your primary care physician to follow up on this.  _______________________________________________________  If you are age 77 or older, your body mass index should be between 23-30. Your Body mass index is 21.35 kg/m. If this is out of the aforementioned range listed, please consider follow up with your Primary Care Provider.  If you are age 75 or younger, your body mass index should be between 19-25. Your Body mass index is 21.35 kg/m. If this is out of the aformentioned range listed, please consider follow up with your Primary Care Provider.   ________________________________________________________  The [facility] providers would like to encourage you to use MYCHART to communicate with providers for non-urgent requests or questions.  Due to long hold times on the telephone, sending your provider a message by [facility] may be a faster and more efficient way to get a response.  Please allow 48 business hours for a response.  Please remember that this is for non-urgent requests.  _______________________________________________________  [facility] is using a team-based approach to care.  Your team is made up of your doctor and two to three APPS. Our APPS (Nurse Practitioners and Physician Assistants) work with your physician to ensure care continuity for you. They are fully qualified to address your health concerns and develop a treatment plan. They communicate directly with your gastroenterologist to care for you. Seeing the Advanced Practice Practitioners on your physician's team can help you by facilitating care more promptly, often allowing for earlier appointments, access to diagnostic testing, procedures, and other specialty referrals.   We have sent the following medications to your pharmacy for you to pick up at your convenience: Omeprazole  20mg   daily for 2 weeks   Repeat colonoscopy for 08-2026 Please call 2 months prior to schedule this. A letter will be sent as it gets closer.  Please follow up in 6 months. Give us  a call at [phone] to schedule an appointment.  Thank you,  Dr. Lynnie Bring

## 2024-02-10 NOTE — Progress Notes (Signed)
 IMPRESSION and PLAN:    #1. Epi pain/LUQ pain. Neg Mesenteric doppler 09/2019, Neg EGD, CT AP as below. Nl GES 07/2020  #2. SIBO. Treated with rifaxamin 11/2023  #3. IBS-C with bloating/LUQ pain. 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  Neg CT 07/2015, 03/2020 except for redundant stool filled colon, neg EGD with SB Bx 08/2018 and colon 07/2018 except for TAs. Neg celiac screen and Nl TSH.  #4. H/O tubular adenomas   #5. GERD with small HH.   #6. Bladder/vaginal prolapse (followed by Dr Alvia- urogyn)  Plan:  -Omeprazole  20mg  po every day x 2 weeks. She doesnot want to take it long term. -Recall Colon 08/2026 (pt would like to get it); would need OV prior d/t age -FU in 43 montns        HPI:    Chief Complaint:   Andrea Bates is a 78 y.o. female  Very anxious For follow-up visit. History of Present Illness FU reflux.  She has been experiencing gastrointestinal symptoms following a recent colonoscopy and endoscopy. She has a history of polyps and is concerned about her prepyloric stomach and reflux symptoms. She describes a sensation of food getting stuck and regurgitating after meals, particularly heavy meals, even after physical activity like a 30-minute walk. These symptoms are persistent, with food sometimes coming back up hours after eating.  She reports a burning sensation in her stomach over the past month, occurring 'right in the middle' and sometimes under the rib cage. She has a small hiatal hernia and experiences throat clearing. She has been hesitant to take medications like omeprazole  or Protonix  due to concerns about side effects, but she has tried Pepcid with limited relief.  She has a history of stomach issues and is concerned about the impact of antibiotics on her stomach health. She recently took antibiotics for a respiratory chest issue, which she believes exacerbated her gastrointestinal symptoms. She is currently taking venlafaxine and has inquired about  its potential effects on her stomach.  She has difficulty maintaining a healthy diet due to her symptoms. She avoids milk products, sugar, and certain vegetables like broccoli, which worsen her symptoms. She feels limited in her dietary choices and is concerned about not eating healthily.  Would like to get follow-up colonoscopy-very adamant about it.     From previous notes: Very much concerned about vagus nerve  Underwent CT scan Abdo/pelvis with p.o. and IV contrast 03/26/2020 which was neg.  On review she did have significant stool in the transverse colon and in splenic flexure.  Has constipation with pellet-like stools.  The abdominal pain is not there in the morning when she wakes up.  It is more prominent in the evening hours.  She has been tested positive for SIBO breath test in past.  She was initially treated with rifaximin  with good results.  Subsequently did not have much benefit with Flagyl .  She was given another course of rifaximin  which did not help much.  Wt Readings from Last 3 Encounters:  02/10/24 113 lb (51.3 kg)  09/08/23 109 lb (49.4 kg)  07/12/23 111 lb (50.3 kg)       Prev GI WU:  EGD 09/08/2023 - Small hiatal hernia. - Otherwise normal EGD.  Colonoscopy 09/08/2023 - One 4 mm polyp in the proximal ascending colon, removed with a cold snare. Resected and retrieved. - One 12 mm polyp in the mid transverse colon, removed piecemeal using a cold snare. Resected and retrieved. - Diverticulosis in the sigmoid  colon and in the descending colon. - Non- bleeding internal hemorrhoids. - Grade 1 rectal prolapse. - The examination was otherwise normal on direct and retroflexion views. - Bx- TA - It was initially recommended to hold off on repeat colonoscopies due to age.  However, patient adamant that she wants to get follow-up colonoscopy done in 3 years.    CT AP 04/2021 with contrast 1. No acute abdominopelvic findings. 2. Large volume of formed stool throughout  the colon suggestive of constipation. 3. Left-sided colonic diverticulosis without findings of acute diverticulitis. 4.  Aortic Atherosclerosis (ICD10-I70.0).  Colon 07/2018 -Two 4 to 6 mm polyps in the ascending colon, removed with a cold snare. Resected and retrieved. -Moderate sigmoid diverticulosis. -Otherwise normal colonoscopy to TI. The colon was highly redundant.  EGD 07/2018 Small HH, gastric fundic gland polyp.  Neg SB Bx for celiac, neg gastric Bx for HP.    CT AP 03/26/2020 No CT findings of the abdomen or pelvis to explain abdominal pain. Aortic Atherosclerosis (ICD10-I70.0).  Solid-phase gastric emptying scan 07/2016: neg  UGI series 07/2015: neg  CT 03/2020 03/26/20 No CT findings of the abdomen or pelvis to explain abdominal pain. Aortic Atherosclerosis (ICD10-I70.0).  Wt Readings from Last 3 Encounters:  02/10/24 113 lb (51.3 kg)  09/08/23 109 lb (49.4 kg)  07/12/23 111 lb (50.3 kg)    Past Medical History:  Diagnosis Date   Cataract    Chest pain    a. negative myoview 2011 Mazie);  b. 04/2010 Cath: normal cors   Colon polyps    Diverticulosis    Female bladder prolapse    GERD (gastroesophageal reflux disease)    a. mild esophagitis on egd 2012   Hemorrhoids    Hiatal hernia    Hyperlipidemia    Hyperlipidemia    Is now taking Fish oil. Had myalgias with Vytorin and Lipitor   Hypothyroidism    Irritable bowel syndrome    Osteoporosis    Palpitations    a. 04/2008 echo: EF 50-60%;  b. 21 day event monitor 2012 - occasional sinus tachycardia with rates 110-120's. Rare PAC's/PVC's. Serveral short 4-5 beat runs of a-tach. c. 06/2016: Event monitor showing PAC's, PVC's, and brief episodes of PAT.    Personal history of colonic polyps    a. colonoscopy 2009   Personal history of malignant neoplasm of breast    Pre-diabetes    Syncope (VasoVagal Faint) 04/27/2010    Current Outpatient Medications  Medication Sig Dispense Refill   Cholecalciferol  (VITAMIN D3) 25 MCG (1000 UT) CAPS Take 2,000 Int'l Units by mouth daily.     Coenzyme Q10 (COQ10) 100 MG CAPS Take 100 mg by mouth daily. Reported on 07/18/2015     cyanocobalamin (VITAMIN B12) 500 MCG tablet Take 500 mcg by mouth daily.     Digestive Enzymes (DIGESTIVE ENZYME ULTRA PO) Take 1 tablet by mouth daily.     fish oil-omega-3 fatty acids 1000 MG capsule Take 1 g by mouth daily. Reported on 07/18/2015     Magnesium  300 MG CAPS Take 1 capsule by mouth daily.     metoprolol  succinate (TOPROL  XL) 25 MG 24 hr tablet Take 1 tablet (25 mg total) by mouth daily. 90 tablet 3   mometasone (NASONEX) 50 MCG/ACT nasal spray Place 2 sprays into the nose daily.     Potassium 99 MG TABS Take by mouth.     POTASSIUM CHLORIDE PO Take 90 mcg by mouth daily.     Simethicone 250 MG  CAPS Take 1 capsule by mouth daily.     SYNTHROID  50 MCG tablet TAKE 1 TABLET DAILY ON AN EMPTY STOMACH WITH ONLY WATER FOR 30 MINUTES & NO ANTACID MEDS, CALCIUM OR MAGNESIUM  FOR 4 HOURS & AVOID BIOTIN 90 tablet 1   No current facility-administered medications for this visit.    Past Surgical History:  Procedure Laterality Date   BUNIONECTOMY     CARPAL TUNNEL RELEASE     bilateral    COLONOSCOPY  07/04/2013   Polyp, Polypectomy. Moderate diverticulosis in L colon. BX: Tubular Adenoma no dyspasia or malignancy. 5 yr rpt colon   NASAL SINUS SURGERY     TONSILLECTOMY     UPPER GASTROINTESTINAL ENDOSCOPY  08/09/2016   normal EGD   UPPER GASTROINTESTINAL ENDOSCOPY  07/04/2013   normal EGD    Family History  Problem Relation Age of Onset   Heart defect Father    Diabetes Paternal Grandmother    Breast cancer Paternal Aunt    Stomach cancer Paternal Aunt    Colon cancer Other        Paternal Great Unlce    Esophageal cancer Neg Hx    Pancreatic cancer Neg Hx     Social History   Tobacco Use   Smoking status: Never   Smokeless tobacco: Never  Vaping Use   Vaping status: Never Used  Substance Use Topics    Alcohol use: No   Drug use: No    Allergies  Allergen Reactions   Atenolol Other (See Comments)    presyncope   Celexa  [Citalopram  Hydrobromide] Other (See Comments)    Severe chest tightness    Diltiazem  Hcl Other (See Comments)    Dropped bp   Levaquin  [Levofloxacin  In D5w] Nausea And Vomiting   Lipitor [Atorvastatin] Other (See Comments)    Myalgias    Vytorin [Ezetimibe -Simvastatin] Other (See Comments)    Myalgias    Armour Thyroid  [Thyroid ] Palpitations    Palpitations    Iodine Palpitations    NOT CT CONTRAST.    Propranolol  Hcl Other (See Comments)    Felt bad   Shrimp Extract Other (See Comments)    Other Reaction(s): GI Intolerance     Review of Systems: All systems reviewed and negative except where noted in HPI.    Physical Exam:     BP 122/78   Pulse 77   Ht 5' 1 (1.549 m)   Wt 113 lb (51.3 kg)   BMI 21.35 kg/m  HEENT: No jaundice, no pallor Respiratory: Bilateral clear. CVS: S1-S2 normal no S3 or S4. Abdo: Soft nontender bowel sounds are present no definite hepatosplenomegaly.      Latest Ref Rng & Units 06/28/2022   12:19 PM 03/24/2020   11:20 AM 11/29/2018   12:38 PM  CBC  WBC 3.4 - 10.8 x10E3/uL 7.9  7.7  7.0   Hemoglobin 11.1 - 15.9 g/dL 85.4  86.3  86.5   Hematocrit 34.0 - 46.6 % 43.6  40.2  39.2   Platelets 150 - 450 x10E3/uL 393  359.0  350       Latest Ref Rng & Units 06/28/2022   12:19 PM 05/06/2021   10:22 AM 03/24/2020   11:20 AM  CMP  Glucose 70 - 99 mg/dL 89  87  88   BUN 8 - 27 mg/dL 15  19  19    Creatinine 0.57 - 1.00 mg/dL 9.30  9.21  9.24   Sodium 134 - 144 mmol/L 141  138  138  Potassium 3.5 - 5.2 mmol/L 5.3  4.4  4.7   Chloride 96 - 106 mmol/L 103  103  102   CO2 20 - 29 mmol/L 24  31  28    Calcium 8.7 - 10.3 mg/dL 89.8  9.8  9.9   Total Protein 6.0 - 8.3 g/dL  7.1  7.1   Total Bilirubin 0.2 - 1.2 mg/dL  0.4  0.3   Alkaline Phos 39 - 117 U/L  123  127   AST 0 - 37 U/L  28  24   ALT 0 - 35 U/L  55  35          Chetara Kropp,MD 02/10/2024, 3:45 PM   CC Zachary Lamar BRAVO, NP
